# Patient Record
Sex: Female | Born: 1970 | Race: Black or African American | Hispanic: No | Marital: Single | Smoking: Never smoker
Health system: Southern US, Community
[De-identification: ages and names within clinical notes are randomized; demographics above are authoritative.]

## PROBLEM LIST (undated history)

## (undated) DIAGNOSIS — M549 Dorsalgia, unspecified: Secondary | ICD-10-CM

## (undated) DIAGNOSIS — I10 Essential (primary) hypertension: Secondary | ICD-10-CM

## (undated) DIAGNOSIS — F419 Anxiety disorder, unspecified: Secondary | ICD-10-CM

## (undated) DIAGNOSIS — M2550 Pain in unspecified joint: Secondary | ICD-10-CM

## (undated) DIAGNOSIS — E8881 Metabolic syndrome: Secondary | ICD-10-CM

## (undated) DIAGNOSIS — R7303 Prediabetes: Secondary | ICD-10-CM

## (undated) DIAGNOSIS — G4730 Sleep apnea, unspecified: Secondary | ICD-10-CM

## (undated) DIAGNOSIS — E079 Disorder of thyroid, unspecified: Secondary | ICD-10-CM

## (undated) DIAGNOSIS — R600 Localized edema: Secondary | ICD-10-CM

## (undated) DIAGNOSIS — E119 Type 2 diabetes mellitus without complications: Secondary | ICD-10-CM

## (undated) DIAGNOSIS — E0500 Thyrotoxicosis with diffuse goiter without thyrotoxic crisis or storm: Secondary | ICD-10-CM

## (undated) DIAGNOSIS — J45909 Unspecified asthma, uncomplicated: Secondary | ICD-10-CM

## (undated) DIAGNOSIS — R0600 Dyspnea, unspecified: Secondary | ICD-10-CM

## (undated) HISTORY — DX: Anxiety disorder, unspecified: F41.9

## (undated) HISTORY — PX: REDUCTION MAMMAPLASTY: SUR839

## (undated) HISTORY — DX: Dyspnea, unspecified: R06.00

## (undated) HISTORY — PX: DILATION AND CURETTAGE OF UTERUS: SHX78

## (undated) HISTORY — PX: OTHER SURGICAL HISTORY: SHX169

## (undated) HISTORY — PX: INDUCED ABORTION: SHX677

## (undated) HISTORY — DX: Essential (primary) hypertension: I10

## (undated) HISTORY — DX: Morbid (severe) obesity due to excess calories: E66.01

## (undated) HISTORY — DX: Dorsalgia, unspecified: M54.9

## (undated) HISTORY — DX: Localized edema: R60.0

## (undated) HISTORY — DX: Sleep apnea, unspecified: G47.30

## (undated) HISTORY — DX: Prediabetes: R73.03

## (undated) HISTORY — PX: COLONOSCOPY: SHX174

## (undated) HISTORY — DX: Metabolic syndrome: E88.81

## (undated) HISTORY — DX: Pain in unspecified joint: M25.50

## (undated) HISTORY — DX: Metabolic syndrome: E88.810

## (undated) HISTORY — DX: Unspecified asthma, uncomplicated: J45.909

---

## 2006-10-01 HISTORY — PX: BREAST REDUCTION SURGERY: SHX8

## 2016-12-20 ENCOUNTER — Encounter: Payer: Self-pay | Admitting: Family Medicine

## 2016-12-26 ENCOUNTER — Encounter: Payer: Self-pay | Admitting: Family Medicine

## 2016-12-26 ENCOUNTER — Ambulatory Visit (INDEPENDENT_AMBULATORY_CARE_PROVIDER_SITE_OTHER): Payer: PRIVATE HEALTH INSURANCE | Admitting: Family Medicine

## 2016-12-26 ENCOUNTER — Other Ambulatory Visit: Payer: Self-pay | Admitting: Family Medicine

## 2016-12-26 VITALS — BP 150/90 | HR 70 | Ht 61.0 in | Wt 249.0 lb

## 2016-12-26 DIAGNOSIS — Z1231 Encounter for screening mammogram for malignant neoplasm of breast: Secondary | ICD-10-CM | POA: Diagnosis not present

## 2016-12-26 DIAGNOSIS — I10 Essential (primary) hypertension: Secondary | ICD-10-CM | POA: Diagnosis not present

## 2016-12-26 DIAGNOSIS — Z0000 Encounter for general adult medical examination without abnormal findings: Secondary | ICD-10-CM | POA: Diagnosis not present

## 2016-12-26 DIAGNOSIS — J45909 Unspecified asthma, uncomplicated: Secondary | ICD-10-CM | POA: Insufficient documentation

## 2016-12-26 DIAGNOSIS — Z1239 Encounter for other screening for malignant neoplasm of breast: Secondary | ICD-10-CM

## 2016-12-26 DIAGNOSIS — J4520 Mild intermittent asthma, uncomplicated: Secondary | ICD-10-CM

## 2016-12-26 LAB — CBC WITH DIFFERENTIAL/PLATELET
Basophils Absolute: 0 cells/uL (ref 0–200)
Basophils Relative: 0 %
Eosinophils Absolute: 336 cells/uL (ref 15–500)
Eosinophils Relative: 4 %
HCT: 34.9 % — ABNORMAL LOW (ref 35.0–45.0)
Hemoglobin: 11.5 g/dL — ABNORMAL LOW (ref 11.7–15.5)
Lymphocytes Relative: 29 %
Lymphs Abs: 2436 cells/uL (ref 850–3900)
MCH: 27.8 pg (ref 27.0–33.0)
MCHC: 33 g/dL (ref 32.0–36.0)
MCV: 84.3 fL (ref 80.0–100.0)
MPV: 10.2 fL (ref 7.5–12.5)
Monocytes Absolute: 588 cells/uL (ref 200–950)
Monocytes Relative: 7 %
Neutro Abs: 5040 cells/uL (ref 1500–7800)
Neutrophils Relative %: 60 %
Platelets: 282 10*3/uL (ref 140–400)
RBC: 4.14 MIL/uL (ref 3.80–5.10)
RDW: 14.9 % (ref 11.0–15.0)
WBC: 8.4 10*3/uL (ref 4.0–10.5)

## 2016-12-26 LAB — POCT URINALYSIS DIPSTICK
Bilirubin, UA: NEGATIVE
Blood, UA: NEGATIVE
Glucose, UA: NEGATIVE
Ketones, UA: NEGATIVE
Leukocytes, UA: NEGATIVE
Nitrite, UA: NEGATIVE
Protein, UA: NEGATIVE
Spec Grav, UA: 1.03 (ref 1.030–1.035)
Urobilinogen, UA: NEGATIVE (ref ?–2.0)
pH, UA: 5.5 (ref 5.0–8.0)

## 2016-12-26 LAB — COMPREHENSIVE METABOLIC PANEL
ALT: 15 U/L (ref 6–29)
AST: 17 U/L (ref 10–30)
Albumin: 3.7 g/dL (ref 3.6–5.1)
Alkaline Phosphatase: 89 U/L (ref 33–115)
BUN: 10 mg/dL (ref 7–25)
CO2: 29 mmol/L (ref 20–31)
Calcium: 9.3 mg/dL (ref 8.6–10.2)
Chloride: 105 mmol/L (ref 98–110)
Creat: 0.86 mg/dL (ref 0.50–1.10)
Glucose, Bld: 96 mg/dL (ref 65–99)
Potassium: 3.9 mmol/L (ref 3.5–5.3)
Sodium: 139 mmol/L (ref 135–146)
Total Bilirubin: 0.3 mg/dL (ref 0.2–1.2)
Total Protein: 6.5 g/dL (ref 6.1–8.1)

## 2016-12-26 MED ORDER — FLUTICASONE-SALMETEROL 250-50 MCG/DOSE IN AEPB: 1.0000 | INHALATION_SPRAY | Freq: Two times a day (BID) | RESPIRATORY_TRACT | 3 refills | Status: DC

## 2016-12-26 MED ORDER — ALBUTEROL SULFATE HFA 108 (90 BASE) MCG/ACT IN AERS: 2.0000 | INHALATION_SPRAY | Freq: Four times a day (QID) | RESPIRATORY_TRACT | 0 refills | Status: DC | PRN

## 2016-12-26 MED ORDER — FLUTICASONE-SALMETEROL 100-50 MCG/DOSE IN AEPB: 1.0000 | INHALATION_SPRAY | Freq: Two times a day (BID) | RESPIRATORY_TRACT | 2 refills | Status: DC

## 2016-12-26 NOTE — Progress Notes (Signed)
Subjective:    Patient ID: Kim Tanner, female    DOB: 08-16-1971, 46 y.o.   MRN: [id number]  HPI Chief Complaint  Patient presents with  . new pt    new pt fasting cpe, history of asthma   She is new to the practice and here for a complete physical exam. Previous medical care: in [place] last August.   Other providers: none here. PCP and OB/GYN in [place]. Would like a referral to OB/GYN here.  Reports history of Metabolic syndrome?  History of asthma since childhood and triggers are when she is "upset". She states she used to take advair but stopped 1 year ago because she did not have insurance.states her asthma was well controlled on Advair.  She does not have an inhaler.  States she had a flare 2 weeks ago and used her son's nebulizer. No recent ED visits. No hospitalizations since childhood for asthma. States she is not having any symptoms today.   Denies history of HTN. Has had some elevated readings in the past.   Social history: Lives with 2 children, works as a Human resources officer Diet: nothing  Excerise: nothing   Immunizations: Tdap - 18 years ago. Refuses today.   Health maintenance:  Mammogram: 6 years ago Colonoscopy: normal colonoscopy 2 years ago. EGD normal also. No records of this. Last Pap smear: 2016- normal per patient.  Last Menstrual cycle: June 2017 Pregnancies: 3  Last Dental Exam: 2 years ago  Last Eye Exam: years  Wears seatbelt always, smoke detectors in home and functioning, does not text while driving and feels safe in home environment.   Reviewed allergies, medications, past medical, surgical, family, and social history.   Review of Systems Review of Systems Constitutional: -fever, -chills, -sweats, -unexpected weight change,-fatigue ENT: -runny nose, -ear pain, -sore throat Cardiology:  -chest pain, -palpitations, -edema Respiratory: -cough, -shortness of breath, -wheezing Gastroenterology: -abdominal pain, -nausea, -vomiting,  -diarrhea, -constipation  Hematology: -bleeding or bruising problems Musculoskeletal: -arthralgias, -myalgias, -joint swelling, -back pain Ophthalmology: -vision changes Urology: -dysuria, -difficulty urinating, -hematuria, -urinary frequency, -urgency Neurology: -headache, -weakness, -tingling, -numbness       Objective:   Physical Exam BP (!) 150/90   Pulse 70   Ht 5\' 1"  (1.549 m)   Wt 249 lb (112.9 kg)   BMI 47.05 kg/m   General Appearance:    Alert, cooperative, no distress, appears stated age  Head:    Normocephalic, without obvious abnormality, atraumatic  Eyes:    PERRL, conjunctiva/corneas clear, EOM's intact, fundi    benign  Ears:    Normal TM's and external ear canals  Nose:   Nares normal, mucosa normal, no drainage or sinus   tenderness  Throat:   Lips, mucosa, and tongue normal; teeth and gums normal  Neck:   Supple, no lymphadenopathy;  thyroid:  no   enlargement/tenderness/nodules; no carotid   bruit or JVD  Back:    Spine nontender, no curvature, ROM normal, no CVA     tenderness  Lungs:     Clear to auscultation bilaterally without wheezes, rales or     ronchi; respirations unlabored  Chest Wall:    No tenderness or deformity   Heart:    Regular rate and rhythm, S1 and S2 normal, no murmur, rub   or gallop  Breast Exam:    Refused. Mammogram ordered.  Abdomen:     Soft, non-tender, nondistended, normoactive bowel sounds,    no masses, no hepatosplenomegaly  Genitalia:  Refused. Would like OB/GYN referral.   Rectal:    Refused. Recent colonoscopy  Extremities:   No clubbing, cyanosis or edema  Pulses:   2+ and symmetric all extremities  Skin:   Skin color, texture, turgor normal, no rashes or lesions  Lymph nodes:   Cervical, supraclavicular, and axillary nodes normal  Neurologic:   CNII-XII intact, normal strength, sensation and gait; reflexes 2+ and symmetric throughout          Psych:   Normal mood, affect, hygiene and grooming.    Urinalysis  dipstick: negative      Assessment & Plan:  Routine general medical examination at a health care facility - Plan: Urinalysis Dipstick, CBC with Differential/Platelet, Comprehensive metabolic panel, TSH, Lipid panel  Mild intermittent asthma without complication - Plan: Spirometry with graph, Fluticasone-Salmeterol (ADVAIR DISKUS) 100-50 MCG/DOSE AEPB  Elevated blood pressure reading in office with diagnosis of hypertension - Plan: CBC with Differential/Platelet, Comprehensive metabolic panel  Morbid obesity (HCC) - Plan: Lipid panel  Screening for breast cancer - Plan: MM DIGITAL SCREENING BILATERAL  She appears to be doing well. Discussed diet improvements and increasing physical activity. Discussed morbid obesity and risk it poses to her health.  Recommend Tdap but she refuses.  History of asthma and out of medications for maintenance.  Abnormal PFTs. Will start her back on Advair and albuterol. This was what she was on previously.  Discussed that her BP is elevated and I recommend she keep an eye on this and return in 1 month for this and asthma.  She will check with her insurance and schedule with OB/GYN for breast and pelvic exam.  Needs to have her lipids checked next week fasting since she is not fasting today.  Follow up pending labs.

## 2016-12-26 NOTE — Patient Instructions (Addendum)
Call and schedule your mammogram.  Start back on Advair and use albuterol as needed.  Follow up for asthma and blood pressure  in 1 month.   Return next week fasting for your cholesterol check. This will be a lab visit.   Start getting at least 150 minutes of physical activity per week.  Cut back on sugar, carbohydrates and fatty, fried food.  We will call you with lab results.   You can schedule with an OB/GYN for your pelvic, pap smear and breast exam.   Preventative Care for Adults - Female      MAINTAIN REGULAR HEALTH EXAMS:  A routine yearly physical is a good way to check in with your primary care provider about your health and preventive screening. It is also an opportunity to share updates about your health and any concerns you have, and receive a thorough all-over exam.   Most health insurance companies pay for at least some preventative services.  Check with your health plan for specific coverages.  WHAT PREVENTATIVE SERVICES DO WOMEN NEED?  Adult women should have their weight and blood pressure checked regularly.   Women age 60 and older should have their cholesterol levels checked regularly.  Women should be screened for cervical cancer with a Pap smear and pelvic exam beginning at either age 35, or 3 years after they become sexually activity.    Breast cancer screening generally begins at age 31 with a mammogram and breast exam by your primary care provider.    Beginning at age 89 and continuing to age 20, women should be screened for colorectal cancer.  Certain people may need continued testing until age 43.  Updating vaccinations is part of preventative care.  Vaccinations help protect against diseases such as the flu.  Osteoporosis is a disease in which the bones lose minerals and strength as we age. Women ages 55 and over should discuss this with their caregivers, as should women after menopause who have other risk factors.  Lab tests are generally done as part  of preventative care to screen for anemia and blood disorders, to screen for problems with the kidneys and liver, to screen for bladder problems, to check blood sugar, and to check your cholesterol level.  Preventative services generally include counseling about diet, exercise, avoiding tobacco, drugs, excessive alcohol consumption, and sexually transmitted infections.    GENERAL RECOMMENDATIONS FOR GOOD HEALTH:  Healthy diet:  Eat a variety of foods, including fruit, vegetables, animal or vegetable protein, such as meat, fish, chicken, and eggs, or beans, lentils, tofu, and grains, such as rice.  Drink plenty of water daily.  Decrease saturated fat in the diet, avoid lots of red meat, processed foods, sweets, fast foods, and fried foods.  Exercise:  Aerobic exercise helps maintain good heart health. At least 30-40 minutes of moderate-intensity exercise is recommended. For example, a brisk walk that increases your heart rate and breathing. This should be done on most days of the week.   Find a type of exercise or a variety of exercises that you enjoy so that it becomes a part of your daily life.  Examples are running, walking, swimming, water aerobics, and biking.  For motivation and support, explore group exercise such as aerobic class, spin class, Zumba, Yoga,or  martial arts, etc.    Set exercise goals for yourself, such as a certain weight goal, walk or run in a race such as a 5k walk/run.  Speak to your primary care provider about exercise goals.  Disease prevention:  If you smoke or chew tobacco, find out from your caregiver how to quit. It can literally save your life, no matter how long you have been a tobacco user. If you do not use tobacco, never begin.   Maintain a healthy diet and normal weight. Increased weight leads to problems with blood pressure and diabetes.   The Body Mass Index or BMI is a way of measuring how much of your body is fat. Having a BMI above 27 increases  the risk of heart disease, diabetes, hypertension, stroke and other problems related to obesity. Your caregiver can help determine your BMI and based on it develop an exercise and dietary program to help you achieve or maintain this important measurement at a healthful level.  High blood pressure causes heart and blood vessel problems.  Persistent high blood pressure should be treated with medicine if weight loss and exercise do not work.   Fat and cholesterol leaves deposits in your arteries that can block them. This causes heart disease and vessel disease elsewhere in your body.  If your cholesterol is found to be high, or if you have heart disease or certain other medical conditions, then you may need to have your cholesterol monitored frequently and be treated with medication.   Ask if you should have a cardiac stress test if your history suggests this. A stress test is a test done on a treadmill that looks for heart disease. This test can find disease prior to there being a problem.  Menopause can be associated with physical symptoms and risks. Hormone replacement therapy is available to decrease these. You should talk to your caregiver about whether starting or continuing to take hormones is right for you.   Osteoporosis is a disease in which the bones lose minerals and strength as we age. This can result in serious bone fractures. Risk of osteoporosis can be identified using a bone density scan. Women ages 2 and over should discuss this with their caregivers, as should women after menopause who have other risk factors. Ask your caregiver whether you should be taking a calcium supplement and Vitamin D, to reduce the rate of osteoporosis.   Avoid drinking alcohol in excess (more than two drinks per day).  Avoid use of street drugs. Do not share needles with anyone. Ask for professional help if you need assistance or instructions on stopping the use of alcohol, cigarettes, and/or drugs.  Brush  your teeth twice a day with fluoride toothpaste, and floss once a day. Good oral hygiene prevents tooth decay and gum disease. The problems can be painful, unattractive, and can cause other health problems. Visit your dentist for a routine oral and dental check up and preventive care every 6-12 months.   Look at your skin regularly.  Use a mirror to look at your back. Notify your caregivers of changes in moles, especially if there are changes in shapes, colors, a size larger than a pencil eraser, an irregular border, or development of new moles.  Safety:  Use seatbelts 100% of the time, whether driving or as a passenger.  Use safety devices such as hearing protection if you work in environments with loud noise or significant background noise.  Use safety glasses when doing any work that could send debris in to the eyes.  Use a helmet if you ride a bike or motorcycle.  Use appropriate safety gear for contact sports.  Talk to your caregiver about gun safety.  Use sunscreen with a  SPF (or skin protection factor) of 15 or greater.  Lighter skinned people are at a greater risk of skin cancer. Don't forget to also wear sunglasses in order to protect your eyes from too much damaging sunlight. Damaging sunlight can accelerate cataract formation.   Practice safe sex. Use condoms. Condoms are used for birth control and to help reduce the spread of sexually transmitted infections (or STIs).  Some of the STIs are gonorrhea (the clap), chlamydia, syphilis, trichomonas, herpes, HPV (human papilloma virus) and HIV (human immunodeficiency virus) which causes AIDS. The herpes, HIV and HPV are viral illnesses that have no cure. These can result in disability, cancer and death.   Keep carbon monoxide and smoke detectors in your home functioning at all times. Change the batteries every 6 months or use a model that plugs into the wall.   Vaccinations:  Stay up to date with your tetanus shots and other required  immunizations. You should have a booster for tetanus every 10 years. Be sure to get your flu shot every year, since 5%-20% of the [place] population comes down with the flu. The flu vaccine changes each year, so being vaccinated once is not enough. Get your shot in the fall, before the flu season peaks.   Other vaccines to consider:  Human Papilloma Virus or HPV causes cancer of the cervix, and other infections that can be transmitted from person to person. There is a vaccine for HPV, and females should get immunized between the ages of 21 and 80. It requires a series of 3 shots.   Pneumococcal vaccine to protect against certain types of pneumonia.  This is normally recommended for adults age 50 or older.  However, adults younger than 44 years old with certain underlying conditions such as diabetes, heart or lung disease should also receive the vaccine.  Shingles vaccine to protect against Varicella Zoster if you are older than age 103, or younger than 44 years old with certain underlying illness.  Hepatitis A vaccine to protect against a form of infection of the liver by a virus acquired from food.  Hepatitis B vaccine to protect against a form of infection of the liver by a virus acquired from blood or body fluids, particularly if you work in health care.  If you plan to travel internationally, check with your local health department for specific vaccination recommendations.  Cancer Screening:  Breast cancer screening is essential to preventive care for women. All women age 22 and older should perform a breast self-exam every month. At age 79 and older, women should have their caregiver complete a breast exam each year. Women at ages 42 and older should have a mammogram (x-ray film) of the breasts. Your caregiver can discuss how often you need mammograms.    Cervical cancer screening includes taking a Pap smear (sample of cells examined under a microscope) from the cervix (end of the uterus). It  also includes testing for HPV (Human Papilloma Virus, which can cause cervical cancer). Screening and a pelvic exam should begin at age 17, or 3 years after a woman becomes sexually active. Screening should occur every year, with a Pap smear but no HPV testing, up to age 48. After age 33, you should have a Pap smear every 3 years with HPV testing, if no HPV was found previously.   Most routine colon cancer screening begins at the age of 57. On a yearly basis, doctors may provide special easy to use take-home tests to check for hidden  blood in the stool. Sigmoidoscopy or colonoscopy can detect the earliest forms of colon cancer and is life saving. These tests use a small camera at the end of a tube to directly examine the colon. Speak to your caregiver about this at age 100, when routine screening begins (and is repeated every 5 years unless early forms of pre-cancerous polyps or small growths are found).

## 2016-12-27 LAB — T3, FREE: T3, Free: 3.8 pg/mL (ref 2.3–4.2)

## 2016-12-27 LAB — T4, FREE: Free T4: 1.3 ng/dL (ref 0.8–1.8)

## 2016-12-27 LAB — TSH: TSH: 0.01 mIU/L — ABNORMAL LOW

## 2016-12-31 ENCOUNTER — Other Ambulatory Visit: Payer: Self-pay | Admitting: Family Medicine

## 2016-12-31 ENCOUNTER — Encounter: Payer: Self-pay | Admitting: Family Medicine

## 2016-12-31 DIAGNOSIS — E0590 Thyrotoxicosis, unspecified without thyrotoxic crisis or storm: Secondary | ICD-10-CM

## 2016-12-31 DIAGNOSIS — Z1231 Encounter for screening mammogram for malignant neoplasm of breast: Secondary | ICD-10-CM

## 2016-12-31 DIAGNOSIS — E0500 Thyrotoxicosis with diffuse goiter without thyrotoxic crisis or storm: Secondary | ICD-10-CM | POA: Insufficient documentation

## 2016-12-31 DIAGNOSIS — Z8639 Personal history of other endocrine, nutritional and metabolic disease: Secondary | ICD-10-CM | POA: Insufficient documentation

## 2017-01-01 ENCOUNTER — Other Ambulatory Visit: Payer: PRIVATE HEALTH INSURANCE

## 2017-01-01 ENCOUNTER — Other Ambulatory Visit: Payer: Self-pay | Admitting: Family Medicine

## 2017-01-01 DIAGNOSIS — R7989 Other specified abnormal findings of blood chemistry: Secondary | ICD-10-CM

## 2017-01-10 ENCOUNTER — Other Ambulatory Visit: Payer: PRIVATE HEALTH INSURANCE

## 2017-01-10 DIAGNOSIS — Z0000 Encounter for general adult medical examination without abnormal findings: Secondary | ICD-10-CM

## 2017-01-10 DIAGNOSIS — R7989 Other specified abnormal findings of blood chemistry: Secondary | ICD-10-CM

## 2017-01-10 LAB — LIPID PANEL
Cholesterol: 148 mg/dL (ref <200)
HDL: 41 mg/dL — ABNORMAL LOW (ref >50)
LDL Cholesterol: 95 mg/dL (ref <100)
Total CHOL/HDL Ratio: 3.6 Ratio (ref <5.0)
Triglycerides: 62 mg/dL (ref <150)
VLDL: 12 mg/dL (ref <30)

## 2017-01-10 LAB — TSH: TSH: 0.01 mIU/L — ABNORMAL LOW

## 2017-01-14 ENCOUNTER — Other Ambulatory Visit: Payer: Self-pay | Admitting: Family Medicine

## 2017-01-14 DIAGNOSIS — R7989 Other specified abnormal findings of blood chemistry: Secondary | ICD-10-CM

## 2017-01-21 ENCOUNTER — Other Ambulatory Visit: Payer: Self-pay

## 2017-01-21 ENCOUNTER — Ambulatory Visit
Admission: RE | Admit: 2017-01-21 | Discharge: 2017-01-21 | Disposition: A | Discharge status: Home / Self Care | Payer: PRIVATE HEALTH INSURANCE | Source: Ambulatory Visit | Attending: Family Medicine | Admitting: Family Medicine

## 2017-01-21 DIAGNOSIS — R7989 Other specified abnormal findings of blood chemistry: Secondary | ICD-10-CM

## 2017-01-23 ENCOUNTER — Ambulatory Visit (INDEPENDENT_AMBULATORY_CARE_PROVIDER_SITE_OTHER): Payer: PRIVATE HEALTH INSURANCE | Admitting: Family Medicine

## 2017-01-23 ENCOUNTER — Encounter: Payer: Self-pay | Admitting: Family Medicine

## 2017-01-23 VITALS — BP 150/80 | HR 77 | Wt 251.4 lb

## 2017-01-23 DIAGNOSIS — R946 Abnormal results of thyroid function studies: Secondary | ICD-10-CM | POA: Diagnosis not present

## 2017-01-23 DIAGNOSIS — R7989 Other specified abnormal findings of blood chemistry: Secondary | ICD-10-CM

## 2017-01-23 DIAGNOSIS — I10 Essential (primary) hypertension: Secondary | ICD-10-CM | POA: Diagnosis not present

## 2017-01-23 DIAGNOSIS — N912 Amenorrhea, unspecified: Secondary | ICD-10-CM | POA: Insufficient documentation

## 2017-01-23 DIAGNOSIS — E041 Nontoxic single thyroid nodule: Secondary | ICD-10-CM

## 2017-01-23 LAB — POCT URINE PREGNANCY: Preg Test, Ur: NEGATIVE

## 2017-01-23 NOTE — Progress Notes (Signed)
   Subjective:    Patient ID: Kim Tanner, female    DOB: 21-Mar-1971, 46 y.o.   MRN: [id number]  HPI Chief Complaint  Patient presents with  . 1 month follow-up    1 month follow-up   She is here to follow up on abnormal TSH and HTN.   No period since last June. History of uterine ablation for heavy bleeding. She had irregular periods after that procedure but then stopped having cycles last June.         Review of Systems Pertinent positives and negatives in the history of present illness.     Objective:   Physical Exam BP (!) 150/80   Pulse 77   Wt 251 lb 6.4 oz (114 kg)   BMI 47.50 kg/m         Assessment & Plan:  Essential hypertension  Low TSH level - Plan: Prolactin, Ambulatory referral to Endocrinology  Amenorrhea - Plan: POCT urine pregnancy, FSH/LH, Estrogens, Total, Prolactin  Morbid obesity ([place])  Right thyroid nodule - Plan: Ambulatory referral to Endocrinology  She does not want to start on medication for blood pressure and would like to try making lifestyle changes.  Discussed risk factors for heart disease including uncontrolled HTN and morbid obesity. Discussed complications of HTN.  Counseled on using free app My Fitness Pal to track calories and nutrition. Encouraged weight loss. She will also watch her sodium intake.  Recommend getting at least 150 minutes of physical activity.  Will see how she can do with lifestyle changes per patient request. She will check her BP at least twice weekly and call me in 1 month with her readings.  Plan to refer her to endocrinology for further evaluation of persistently low TSH, normal T3 and free T4 and right thyroid nodule.  Discussed that she has not had a menstrual cycles in 10 months and this may be due to menopause. Will check labs and follow up.

## 2017-01-23 NOTE — Patient Instructions (Addendum)
Start checking your blood pressure 2-3 times per week and call me in 1 month. I know you are not in favor of starting on medication so let's see how you do over the next 4 weeks.  Start using the free app My Fitness Pal and start getting at least 150 minutes of physical activity per week.  Do no drink your calories. Cut back on carbohydrates and eat small frequent meals throughout the day. eat healthy snacks.  Let's see how you are doing in 1 month with your lifestyle modifications and blood pressure. Call me.     Hypertension Hypertension, commonly called high blood pressure, is when the force of blood pumping through the arteries is too strong. The arteries are the blood vessels that carry blood from the heart throughout the body. Hypertension forces the heart to work harder to pump blood and may cause arteries to become narrow or stiff. Having untreated or uncontrolled hypertension can cause heart attacks, strokes, kidney disease, and other problems. A blood pressure reading consists of a higher number over a lower number. Ideally, your blood pressure should be below 120/80. The first ("top") number is called the systolic pressure. It is a measure of the pressure in your arteries as your heart beats. The second ("bottom") number is called the diastolic pressure. It is a measure of the pressure in your arteries as the heart relaxes. What are the causes? The cause of this condition is not known. What increases the risk? Some risk factors for high blood pressure are under your control. Others are not. Factors you can change   Smoking.  Having type 2 diabetes mellitus, high cholesterol, or both.  Not getting enough exercise or physical activity.  Being overweight.  Having too much fat, sugar, calories, or salt (sodium) in your diet.  Drinking too much alcohol. Factors that are difficult or impossible to change   Having chronic kidney disease.  Having a family history of high blood  pressure.  Age. Risk increases with age.  Race. You may be at higher risk if you are African-American.  Gender. Men are at higher risk than women before age 91. After age 77, women are at higher risk than men.  Having obstructive sleep apnea.  Stress. What are the signs or symptoms? Extremely high blood pressure (hypertensive crisis) may cause:  Headache.  Anxiety.  Shortness of breath.  Nosebleed.  Nausea and vomiting.  Severe chest pain.  Jerky movements you cannot control (seizures). How is this diagnosed? This condition is diagnosed by measuring your blood pressure while you are seated, with your arm resting on a surface. The cuff of the blood pressure monitor will be placed directly against the skin of your upper arm at the level of your heart. It should be measured at least twice using the same arm. Certain conditions can cause a difference in blood pressure between your right and left arms. Certain factors can cause blood pressure readings to be lower or higher than normal (elevated) for a short period of time:  When your blood pressure is higher when you are in a health care provider's office than when you are at home, this is called white coat hypertension. Most people with this condition do not need medicines.  When your blood pressure is higher at home than when you are in a health care provider's office, this is called masked hypertension. Most people with this condition may need medicines to control blood pressure. If you have a high blood pressure reading  during one visit or you have normal blood pressure with other risk factors:  You may be asked to return on a different day to have your blood pressure checked again.  You may be asked to monitor your blood pressure at home for 1 week or longer. If you are diagnosed with hypertension, you may have other blood or imaging tests to help your health care provider understand your overall risk for other conditions. How  is this treated? This condition is treated by making healthy lifestyle changes, such as eating healthy foods, exercising more, and reducing your alcohol intake. Your health care provider may prescribe medicine if lifestyle changes are not enough to get your blood pressure under control, and if:  Your systolic blood pressure is above 130.  Your diastolic blood pressure is above 80. Your personal target blood pressure may vary depending on your medical conditions, your age, and other factors. Follow these instructions at home: Eating and drinking   Eat a diet that is high in fiber and potassium, and low in sodium, added sugar, and fat. An example eating plan is called the DASH (Dietary Approaches to Stop Hypertension) diet. To eat this way:  Eat plenty of fresh fruits and vegetables. Try to fill half of your plate at each meal with fruits and vegetables.  Eat whole grains, such as whole wheat pasta, brown rice, or whole grain bread. Fill about one quarter of your plate with whole grains.  Eat or drink low-fat dairy products, such as skim milk or low-fat yogurt.  Avoid fatty cuts of meat, processed or cured meats, and poultry with skin. Fill about one quarter of your plate with lean proteins, such as fish, chicken without skin, beans, eggs, and tofu.  Avoid premade and processed foods. These tend to be higher in sodium, added sugar, and fat.  Reduce your daily sodium intake. Most people with hypertension should eat less than 1,500 mg of sodium a day.  Limit alcohol intake to no more than 1 drink a day for nonpregnant women and 2 drinks a day for men. One drink equals 12 oz of beer, 5 oz of wine, or 1 oz of hard liquor. Lifestyle   Work with your health care provider to maintain a healthy body weight or to lose weight. Ask what an ideal weight is for you.  Get at least 30 minutes of exercise that causes your heart to beat faster (aerobic exercise) most days of the week. Activities may  include walking, swimming, or biking.  Include exercise to strengthen your muscles (resistance exercise), such as pilates or lifting weights, as part of your weekly exercise routine. Try to do these types of exercises for 30 minutes at least 3 days a week.  Do not use any products that contain nicotine or tobacco, such as cigarettes and e-cigarettes. If you need help quitting, ask your health care provider.  Monitor your blood pressure at home as told by your health care provider.  Keep all follow-up visits as told by your health care provider. This is important. Medicines   Take over-the-counter and prescription medicines only as told by your health care provider. Follow directions carefully. Blood pressure medicines must be taken as prescribed.  Do not skip doses of blood pressure medicine. Doing this puts you at risk for problems and can make the medicine less effective.  Ask your health care provider about side effects or reactions to medicines that you should watch for. Contact a health care provider if:  You  think you are having a reaction to a medicine you are taking.  You have headaches that keep coming back (recurring).  You feel dizzy.  You have swelling in your ankles.  You have trouble with your vision. Get help right away if:  You develop a severe headache or confusion.  You have unusual weakness or numbness.  You feel faint.  You have severe pain in your chest or abdomen.  You vomit repeatedly.  You have trouble breathing. Summary  Hypertension is when the force of blood pumping through your arteries is too strong. If this condition is not controlled, it may put you at risk for serious complications.  Your personal target blood pressure may vary depending on your medical conditions, your age, and other factors. For most people, a normal blood pressure is less than 120/80.  Hypertension is treated with lifestyle changes, medicines, or a combination of both.  Lifestyle changes include weight loss, eating a healthy, low-sodium diet, exercising more, and limiting alcohol. This information is not intended to replace advice given to you by your health care provider. Make sure you discuss any questions you have with your health care provider. Document Released: 09/17/2005 Document Revised: 08/15/2016 Document Reviewed: 08/15/2016 Elsevier Interactive Patient Education  2017 [employer].

## 2017-01-24 LAB — PROLACTIN: Prolactin: 7.3 ng/mL

## 2017-01-24 LAB — FSH/LH
FSH: 6.1 m[IU]/mL
LH: 1.4 m[IU]/mL

## 2017-01-26 LAB — ESTROGENS, TOTAL: Estrogen: 96.1 pg/mL

## 2017-02-15 ENCOUNTER — Encounter: Payer: Self-pay | Admitting: Endocrinology

## 2017-06-27 ENCOUNTER — Ambulatory Visit (INDEPENDENT_AMBULATORY_CARE_PROVIDER_SITE_OTHER): Payer: PRIVATE HEALTH INSURANCE | Admitting: Family Medicine

## 2017-06-27 ENCOUNTER — Encounter: Payer: Self-pay | Admitting: Family Medicine

## 2017-06-27 VITALS — BP 172/98 | HR 73 | Ht 61.0 in | Wt 250.4 lb

## 2017-06-27 DIAGNOSIS — R946 Abnormal results of thyroid function studies: Secondary | ICD-10-CM | POA: Diagnosis not present

## 2017-06-27 DIAGNOSIS — I10 Essential (primary) hypertension: Secondary | ICD-10-CM | POA: Diagnosis not present

## 2017-06-27 DIAGNOSIS — R7989 Other specified abnormal findings of blood chemistry: Secondary | ICD-10-CM

## 2017-06-27 DIAGNOSIS — H6123 Impacted cerumen, bilateral: Secondary | ICD-10-CM | POA: Diagnosis not present

## 2017-06-27 MED ORDER — AMLODIPINE BESYLATE 5 MG PO TABS: 5.0000 mg | ORAL_TABLET | Freq: Every day | ORAL | 0 refills | Status: DC

## 2017-06-27 NOTE — Patient Instructions (Addendum)
Start the amlodipine. Try to spot check your blood pressure at a pharmacy or somewhere. You can purchase a cuff to use at home but make sure you get the correct size cuff.   Cut back on salt. Increase exercise.   Follow up with me in 2 weeks.    Hypertension Hypertension, commonly called high blood pressure, is when the force of blood pumping through the arteries is too strong. The arteries are the blood vessels that carry blood from the heart throughout the body. Hypertension forces the heart to work harder to pump blood and may cause arteries to become narrow or stiff. Having untreated or uncontrolled hypertension can cause heart attacks, strokes, kidney disease, and other problems. A blood pressure reading consists of a higher number over a lower number. Ideally, your blood pressure should be below 120/80. The first ("top") number is called the systolic pressure. It is a measure of the pressure in your arteries as your heart beats. The second ("bottom") number is called the diastolic pressure. It is a measure of the pressure in your arteries as the heart relaxes. What are the causes? The cause of this condition is not known. What increases the risk? Some risk factors for high blood pressure are under your control. Others are not. Factors you can change  Smoking.  Having type 2 diabetes mellitus, high cholesterol, or both.  Not getting enough exercise or physical activity.  Being overweight.  Having too much fat, sugar, calories, or salt (sodium) in your diet.  Drinking too much alcohol. Factors that are difficult or impossible to change  Having chronic kidney disease.  Having a family history of high blood pressure.  Age. Risk increases with age.  Race. You may be at higher risk if you are African-American.  Gender. Men are at higher risk than women before age 20. After age 51, women are at higher risk than men.  Having obstructive sleep apnea.  Stress. What are the signs  or symptoms? Extremely high blood pressure (hypertensive crisis) may cause:  Headache.  Anxiety.  Shortness of breath.  Nosebleed.  Nausea and vomiting.  Severe chest pain.  Jerky movements you cannot control (seizures).  How is this diagnosed? This condition is diagnosed by measuring your blood pressure while you are seated, with your arm resting on a surface. The cuff of the blood pressure monitor will be placed directly against the skin of your upper arm at the level of your heart. It should be measured at least twice using the same arm. Certain conditions can cause a difference in blood pressure between your right and left arms. Certain factors can cause blood pressure readings to be lower or higher than normal (elevated) for a short period of time:  When your blood pressure is higher when you are in a health care provider's office than when you are at home, this is called white coat hypertension. Most people with this condition do not need medicines.  When your blood pressure is higher at home than when you are in a health care provider's office, this is called masked hypertension. Most people with this condition may need medicines to control blood pressure.  If you have a high blood pressure reading during one visit or you have normal blood pressure with other risk factors:  You may be asked to return on a different day to have your blood pressure checked again.  You may be asked to monitor your blood pressure at home for 1 week or longer.  If you are diagnosed with hypertension, you may have other blood or imaging tests to help your health care provider understand your overall risk for other conditions. How is this treated? This condition is treated by making healthy lifestyle changes, such as eating healthy foods, exercising more, and reducing your alcohol intake. Your health care provider may prescribe medicine if lifestyle changes are not enough to get your blood pressure  under control, and if:  Your systolic blood pressure is above 130.  Your diastolic blood pressure is above 80.  Your personal target blood pressure may vary depending on your medical conditions, your age, and other factors. Follow these instructions at home: Eating and drinking  Eat a diet that is high in fiber and potassium, and low in sodium, added sugar, and fat. An example eating plan is called the DASH (Dietary Approaches to Stop Hypertension) diet. To eat this way: ? Eat plenty of fresh fruits and vegetables. Try to fill half of your plate at each meal with fruits and vegetables. ? Eat whole grains, such as whole wheat pasta, brown rice, or whole grain bread. Fill about one quarter of your plate with whole grains. ? Eat or drink low-fat dairy products, such as skim milk or low-fat yogurt. ? Avoid fatty cuts of meat, processed or cured meats, and poultry with skin. Fill about one quarter of your plate with lean proteins, such as fish, chicken without skin, beans, eggs, and tofu. ? Avoid premade and processed foods. These tend to be higher in sodium, added sugar, and fat.  Reduce your daily sodium intake. Most people with hypertension should eat less than 1,500 mg of sodium a day.  Limit alcohol intake to no more than 1 drink a day for nonpregnant women and 2 drinks a day for men. One drink equals 12 oz of beer, 5 oz of wine, or 1 oz of hard liquor. Lifestyle  Work with your health care provider to maintain a healthy body weight or to lose weight. Ask what an ideal weight is for you.  Get at least 30 minutes of exercise that causes your heart to beat faster (aerobic exercise) most days of the week. Activities may include walking, swimming, or biking.  Include exercise to strengthen your muscles (resistance exercise), such as pilates or lifting weights, as part of your weekly exercise routine. Try to do these types of exercises for 30 minutes at least 3 days a week.  Do not use any  products that contain nicotine or tobacco, such as cigarettes and e-cigarettes. If you need help quitting, ask your health care provider.  Monitor your blood pressure at home as told by your health care provider.  Keep all follow-up visits as told by your health care provider. This is important. Medicines  Take over-the-counter and prescription medicines only as told by your health care provider. Follow directions carefully. Blood pressure medicines must be taken as prescribed.  Do not skip doses of blood pressure medicine. Doing this puts you at risk for problems and can make the medicine less effective.  Ask your health care provider about side effects or reactions to medicines that you should watch for. Contact a health care provider if:  You think you are having a reaction to a medicine you are taking.  You have headaches that keep coming back (recurring).  You feel dizzy.  You have swelling in your ankles.  You have trouble with your vision. Get help right away if:  You develop a severe headache  or confusion.  You have unusual weakness or numbness.  You feel faint.  You have severe pain in your chest or abdomen.  You vomit repeatedly.  You have trouble breathing. Summary  Hypertension is when the force of blood pumping through your arteries is too strong. If this condition is not controlled, it may put you at risk for serious complications.  Your personal target blood pressure may vary depending on your medical conditions, your age, and other factors. For most people, a normal blood pressure is less than 120/80.  Hypertension is treated with lifestyle changes, medicines, or a combination of both. Lifestyle changes include weight loss, eating a healthy, low-sodium diet, exercising more, and limiting alcohol. This information is not intended to replace advice given to you by your health care provider. Make sure you discuss any questions you have with your health care  provider. Document Released: 09/17/2005 Document Revised: 08/15/2016 Document Reviewed: 08/15/2016 Elsevier Interactive Patient Education  2018 [place] Eating Plan DASH stands for "Dietary Approaches to Stop Hypertension." The DASH eating plan is a healthy eating plan that has been shown to reduce high blood pressure (hypertension). It may also reduce your risk for type 2 diabetes, heart disease, and stroke. The DASH eating plan may also help with weight loss. What are tips for following this plan? General guidelines  Avoid eating more than 2,300 mg (milligrams) of salt (sodium) a day. If you have hypertension, you may need to reduce your sodium intake to 1,500 mg a day.  Limit alcohol intake to no more than 1 drink a day for nonpregnant women and 2 drinks a day for men. One drink equals 12 oz of beer, 5 oz of wine, or 1 oz of hard liquor.  Work with your health care provider to maintain a healthy body weight or to lose weight. Ask what an ideal weight is for you.  Get at least 30 minutes of exercise that causes your heart to beat faster (aerobic exercise) most days of the week. Activities may include walking, swimming, or biking.  Work with your health care provider or diet and nutrition specialist (dietitian) to adjust your eating plan to your individual calorie needs. Reading food labels  Check food labels for the amount of sodium per serving. Choose foods with less than 5 percent of the Daily Value of sodium. Generally, foods with less than 300 mg of sodium per serving fit into this eating plan.  To find whole grains, look for the word "whole" as the first word in the ingredient list. Shopping  Buy products labeled as "low-sodium" or "no salt added."  Buy fresh foods. Avoid canned foods and premade or frozen meals. Cooking  Avoid adding salt when cooking. Use salt-free seasonings or herbs instead of table salt or sea salt. Check with your health care provider or  pharmacist before using salt substitutes.  Do not fry foods. Cook foods using healthy methods such as baking, boiling, grilling, and broiling instead.  Cook with heart-healthy oils, such as olive, canola, soybean, or sunflower oil. Meal planning   Eat a balanced diet that includes: ? 5 or more servings of fruits and vegetables each day. At each meal, try to fill half of your plate with fruits and vegetables. ? Up to 6-8 servings of whole grains each day. ? Less than 6 oz of lean meat, poultry, or fish each day. A 3-oz serving of meat is about the same size as a deck of cards. One  egg equals 1 oz. ? 2 servings of low-fat dairy each day. ? A serving of nuts, seeds, or beans 5 times each week. ? Heart-healthy fats. Healthy fats called Omega-3 fatty acids are found in foods such as flaxseeds and coldwater fish, like sardines, salmon, and mackerel.  Limit how much you eat of the following: ? Canned or prepackaged foods. ? Food that is high in trans fat, such as fried foods. ? Food that is high in saturated fat, such as fatty meat. ? Sweets, desserts, sugary drinks, and other foods with added sugar. ? Full-fat dairy products.  Do not salt foods before eating.  Try to eat at least 2 vegetarian meals each week.  Eat more home-cooked food and less restaurant, buffet, and fast food.  When eating at a restaurant, ask that your food be prepared with less salt or no salt, if possible. What foods are recommended? The items listed may not be a complete list. Talk with your dietitian about what dietary choices are best for you. Grains Whole-grain or whole-wheat bread. Whole-grain or whole-wheat pasta. Brown rice. [place]. Bulgur. Whole-grain and low-sodium cereals. Pita bread. Low-fat, low-sodium crackers. Whole-wheat flour tortillas. Vegetables Fresh or frozen vegetables (raw, steamed, roasted, or grilled). Low-sodium or reduced-sodium tomato and vegetable juice. Low-sodium or  reduced-sodium tomato sauce and tomato paste. Low-sodium or reduced-sodium canned vegetables. Fruits All fresh, dried, or frozen fruit. Canned fruit in natural juice (without added sugar). Meat and other protein foods Skinless chicken or [place]. Ground chicken or [place]. Pork with fat trimmed off. Fish and seafood. Egg whites. Dried beans, peas, or lentils. Unsalted nuts, nut butters, and seeds. Unsalted canned beans. Lean cuts of beef with fat trimmed off. Low-sodium, lean deli meat. Dairy Low-fat (1%) or fat-free (skim) milk. Fat-free, low-fat, or reduced-fat cheeses. Nonfat, low-sodium ricotta or cottage cheese. Low-fat or nonfat yogurt. Low-fat, low-sodium cheese. Fats and oils Soft margarine without trans fats. Vegetable oil. Low-fat, reduced-fat, or light mayonnaise and salad dressings (reduced-sodium). Canola, safflower, olive, soybean, and sunflower oils. Avocado. Seasoning and other foods Herbs. Spices. Seasoning mixes without salt. Unsalted popcorn and pretzels. Fat-free sweets. What foods are not recommended? The items listed may not be a complete list. Talk with your dietitian about what dietary choices are best for you. Grains Baked goods made with fat, such as croissants, muffins, or some breads. Dry pasta or rice meal packs. Vegetables Creamed or fried vegetables. Vegetables in a cheese sauce. Regular canned vegetables (not low-sodium or reduced-sodium). Regular canned tomato sauce and paste (not low-sodium or reduced-sodium). Regular tomato and vegetable juice (not low-sodium or reduced-sodium). Angie Fava. Olives. Fruits Canned fruit in a light or heavy syrup. Fried fruit. Fruit in cream or butter sauce. Meat and other protein foods Fatty cuts of meat. Ribs. Fried meat. Berniece Salines. Sausage. Bologna and other processed lunch meats. Salami. Fatback. Hotdogs. Bratwurst. Salted nuts and seeds. Canned beans with added salt. Canned or smoked fish. Whole eggs or egg yolks. Chicken or [place]  with skin. Dairy Whole or 2% milk, cream, and half-and-half. Whole or full-fat cream cheese. Whole-fat or sweetened yogurt. Full-fat cheese. Nondairy creamers. Whipped toppings. Processed cheese and cheese spreads. Fats and oils Butter. Stick margarine. Lard. Shortening. Ghee. Bacon fat. Tropical oils, such as coconut, palm kernel, or palm oil. Seasoning and other foods Salted popcorn and pretzels. Onion salt, garlic salt, seasoned salt, table salt, and sea salt. Worcestershire sauce. Tartar sauce. Barbecue sauce. Teriyaki sauce. Soy sauce, including reduced-sodium. Steak sauce. Canned and packaged gravies. Fish  sauce. Oyster sauce. Cocktail sauce. Horseradish that you find on the shelf. Ketchup. Mustard. Meat flavorings and tenderizers. Bouillon cubes. Hot sauce and Tabasco sauce. Premade or packaged marinades. Premade or packaged taco seasonings. Relishes. Regular salad dressings. Where to find more information:  National Heart, Lung, and [place]: https://wilson-eaton.com/  American Heart Association: www.heart.org Summary  The DASH eating plan is a healthy eating plan that has been shown to reduce high blood pressure (hypertension). It may also reduce your risk for type 2 diabetes, heart disease, and stroke.  With the DASH eating plan, you should limit salt (sodium) intake to 2,300 mg a day. If you have hypertension, you may need to reduce your sodium intake to 1,500 mg a day.  When on the DASH eating plan, aim to eat more fresh fruits and vegetables, whole grains, lean proteins, low-fat dairy, and heart-healthy fats.  Work with your health care provider or diet and nutrition specialist (dietitian) to adjust your eating plan to your individual calorie needs. This information is not intended to replace advice given to you by your health care provider. Make sure you discuss any questions you have with your health care provider. Document Released: 09/06/2011 Document Revised: 09/10/2016  Document Reviewed: 09/10/2016 Elsevier Interactive Patient Education  2017 [employer].

## 2017-06-27 NOTE — Progress Notes (Signed)
   Subjective:    Patient ID: Kim Tanner, female    DOB: [birth year], 46 y.o.   MRN: [id number]  HPI Chief Complaint  Patient presents with  . blood pressure running high    blood pressure running high. went to urgent care to get CPE and TB for work and was told her bp was high, hasn't checked it since that visit. declines flu shot   She is here to follow up on elevated BP from her last visit in April 2018. She did not return as recommended.  Reports history of HTN. States she has never taken medication for this. States she does not want to have HTN and is upset about this.   Her TSH was low with a normal free T4 and T3. US showed a benign nodule that did not require follow up. I referred her to endocrinology but states she did not schedule an appointment to see them.   Complains of left ear feeling clogged.   Denies fever, chills, dizziness, rhinorrhea, nasal congestion, sore throat, chest pain, palpitations, shortness of breath, abdominal pain, N/V/D, urinary symptoms, LE edema.   Reviewed allergies, medications, past medical, surgical, family, and social history.   Review of Systems Pertinent positives and negatives in the history of present illness.     Objective:   Physical Exam BP (!) 172/98   Pulse 73   Ht '5\' 1"'$  (1.549 m)   Wt 250 lb 6.4 oz (113.6 kg)   BMI 47.31 kg/m   Alert and in no distress. Tympanic membranes cannot be visualized due to cerumen impacted canals bilaterally. Pharyngeal area is normal. Neck is supple without adenopathy or thyromegaly. Cardiac exam shows a regular sinus rhythm without murmurs or gallops. Lungs are clear to auscultation.     Assessment & Plan:  Essential hypertension - Plan: amLODipine (NORVASC) 5 MG tablet  Low TSH level  Bilateral impacted cerumen  Recommend doing labs and ECG. She would like to return in October when her new insurance is effective. Declines any tests today.  Will start her on amlodipine and have her start  checking her BP at home.  Counseled on healthy diet and exercise.  She will follow up in 2 weeks for further evaluation of HTN.  She may try OTC cerumen removal kit or let us know if she would like to return for a disimpaction. She did not have time today.

## 2017-07-11 ENCOUNTER — Ambulatory Visit: Payer: PRIVATE HEALTH INSURANCE | Admitting: Family Medicine

## 2017-08-21 ENCOUNTER — Encounter: Payer: Self-pay | Admitting: Family Medicine

## 2017-08-21 ENCOUNTER — Ambulatory Visit: Payer: BC Managed Care – PPO | Admitting: Family Medicine

## 2017-08-21 VITALS — BP 144/88 | HR 80 | Wt 251.0 lb

## 2017-08-21 DIAGNOSIS — I10 Essential (primary) hypertension: Secondary | ICD-10-CM

## 2017-08-21 DIAGNOSIS — R7989 Other specified abnormal findings of blood chemistry: Secondary | ICD-10-CM

## 2017-08-21 DIAGNOSIS — H6122 Impacted cerumen, left ear: Secondary | ICD-10-CM

## 2017-08-21 DIAGNOSIS — M25561 Pain in right knee: Secondary | ICD-10-CM | POA: Diagnosis not present

## 2017-08-21 LAB — COMPLETE METABOLIC PANEL WITH GFR
AG Ratio: 1.4 (calc) (ref 1.0–2.5)
ALT: 14 U/L (ref 6–29)
AST: 14 U/L (ref 10–30)
Albumin: 3.6 g/dL (ref 3.6–5.1)
Alkaline phosphatase (APISO): 92 U/L (ref 33–115)
BUN: 8 mg/dL (ref 7–25)
CO2: 28 mmol/L (ref 20–32)
Calcium: 9 mg/dL (ref 8.6–10.2)
Chloride: 105 mmol/L (ref 98–110)
Creat: 0.77 mg/dL (ref 0.50–1.10)
GFR, Est African American: 109 mL/min/{1.73_m2} (ref >=60)
GFR, Est Non African American: 94 mL/min/{1.73_m2} (ref >=60)
Globulin: 2.6 g/dL (calc) (ref 1.9–3.7)
Glucose, Bld: 114 mg/dL — ABNORMAL HIGH (ref 65–99)
Potassium: 3.9 mmol/L (ref 3.5–5.3)
Sodium: 140 mmol/L (ref 135–146)
Total Bilirubin: 0.3 mg/dL (ref 0.2–1.2)
Total Protein: 6.2 g/dL (ref 6.1–8.1)

## 2017-08-21 LAB — T4, FREE: Free T4: 1.4 ng/dL (ref 0.8–1.8)

## 2017-08-21 LAB — CBC WITH DIFFERENTIAL/PLATELET
Basophils Absolute: 32 cells/uL (ref 0–200)
Basophils Relative: 0.5 %
Eosinophils Absolute: 128 cells/uL (ref 15–500)
Eosinophils Relative: 2 %
HCT: 34.8 % — ABNORMAL LOW (ref 35.0–45.0)
Hemoglobin: 11.8 g/dL (ref 11.7–15.5)
Lymphs Abs: 1856 cells/uL (ref 850–3900)
MCH: 27.9 pg (ref 27.0–33.0)
MCHC: 33.9 g/dL (ref 32.0–36.0)
MCV: 82.3 fL (ref 80.0–100.0)
MPV: 11.3 fL (ref 7.5–12.5)
Monocytes Relative: 5.9 %
Neutro Abs: 4006 cells/uL (ref 1500–7800)
Neutrophils Relative %: 62.6 %
Platelets: 251 10*3/uL (ref 140–400)
RBC: 4.23 10*6/uL (ref 3.80–5.10)
RDW: 14.4 % (ref 11.0–15.0)
Total Lymphocyte: 29 %
WBC mixed population: 378 cells/uL (ref 200–950)
WBC: 6.4 10*3/uL (ref 3.8–10.8)

## 2017-08-21 LAB — T3: T3, Total: 156 ng/dL (ref 76–181)

## 2017-08-21 LAB — TSH: TSH: 0.01 mIU/L — ABNORMAL LOW

## 2017-08-21 MED ORDER — AMLODIPINE BESYLATE 5 MG PO TABS: 5.0000 mg | ORAL_TABLET | Freq: Every day | ORAL | 2 refills | Status: DC

## 2017-08-21 NOTE — Progress Notes (Signed)
   Subjective:    Patient ID: Kim Tanner, female    DOB: 12-Oct-1970, 46 y.o.   MRN: [id number]  HPI Chief Complaint  Patient presents with  . Follow-up    follow up form htn, ear feeling clogged    She is here to follow up on HTN. She has multiple complaints. States she ran out of her medication 2 weeks ago and did not let us know.   States she made some healthy diet changes and has cut back on soda and pork.  States she has been walking more.  She has not been checking her BP at home.   States her periods are regular now.   Complains of acute right knee pain without injury but she does have a history of right knee pain in the past and has been evaluated for this in [place]. States she has an XR 1-2 years ago. Pain is medial and lateral.  Denies locking, popping or giving away.   Complains of left ear feeling clogged and decreased hearing. Requests ear lavage.   Denies fever, chills, dizziness, chest pain, palpitations, shortness of breath, abdominal pain, N/V/D, urinary symptoms, LE edema.   Reviewed allergies, medications, past medical, surgical, and social history.   Review of Systems Pertinent positives and negatives in the history of present illness.     Objective:   Physical Exam BP (!) 144/88   Pulse 80   Wt 251 lb (113.9 kg)   SpO2 98%   BMI 47.43 kg/m  Alert and in no distress. Left TM with cerumen impaction prior to ear lavage, normal TM post lavage, Right TM normal and canals are normal. Pharyngeal area is normal. Neck is supple without adenopathy or thyromegaly. Cardiac exam shows a regular sinus rhythm without murmurs or gallops. Lungs are clear to auscultation. Right knee without erythema, effusion, medial and lateral joint line tenderness, no laxity, negative McMurrays. RLE is neurovascularly intact. Normal gait.       Assessment & Plan:  Essential hypertension - Plan: amLODipine (NORVASC) 5 MG tablet, CBC with Differential/Platelet, COMPLETE METABOLIC  PANEL WITH GFR, CANCELED: POCT Urinalysis DIP (Proadvantage Device)  Acute pain of right knee  Morbid obesity ([place]) - Plan: TSH  Low TSH level - Plan: TSH, T4, Free, T3  Impacted cerumen of left ear  HTN- BP is not in goal range and she has not been taking medication. Will refill medication and strongly encouraged good compliance. She is aware of potential consequences of uncontrolled HTN long term.  She is making healthy diet and lifestyle changes. Discussed weight loss will help knee pain.  Discussed conservative treatment for right knee pain including Aleve, ice if needed.  We will attempt to get records including right knee XR results from [place] 1-2 years ago.  She did not follow up with endocrinology for low TSH, normal T3, free T4. We will recheck labs.  Ear lavage done on left ear due to cerumen impaction and she tolerated this well. Normal TM and hearing improved post lavage.  Call me in 2 weeks with BP readings.

## 2017-08-21 NOTE — Patient Instructions (Signed)
Start back on amlodipine.  Continue checking blood pressures daily and keep a record of these.  Call me in 2 weeks and let me know what yourreadings are.  Cut back on carbohydrates and increase your physical activity.  Return in 3 months or sooner if your blood pressures are not in goal range.  Goal blood pressure is less than 130/80  We will call you with your lab results.  Take 2 Aleve twice daily with food for the next week or so for your knee pain.  We will attempt to get the records regarding your x-ray and knee exam from [place].  Let me know if your pain or symptoms are getting worse.    DASH Eating Plan DASH stands for "Dietary Approaches to Stop Hypertension." The DASH eating plan is a healthy eating plan that has been shown to reduce high blood pressure (hypertension). It may also reduce your risk for type 2 diabetes, heart disease, and stroke. The DASH eating plan may also help with weight loss. What are tips for following this plan? General guidelines  Avoid eating more than 2,300 mg (milligrams) of salt (sodium) a day. If you have hypertension, you may need to reduce your sodium intake to 1,500 mg a day.  Limit alcohol intake to no more than 1 drink a day for nonpregnant women and 2 drinks a day for men. One drink equals 12 oz of beer, 5 oz of wine, or 1 oz of hard liquor.  Work with your health care provider to maintain a healthy body weight or to lose weight. Ask what an ideal weight is for you.  Get at least 30 minutes of exercise that causes your heart to beat faster (aerobic exercise) most days of the week. Activities may include walking, swimming, or biking.  Work with your health care provider or diet and nutrition specialist (dietitian) to adjust your eating plan to your individual calorie needs. Reading food labels  Check food labels for the amount of sodium per serving. Choose foods with less than 5 percent of the Daily Value of sodium. Generally, foods with  less than 300 mg of sodium per serving fit into this eating plan.  To find whole grains, look for the word "whole" as the first word in the ingredient list. Shopping  Buy products labeled as "low-sodium" or "no salt added."  Buy fresh foods. Avoid canned foods and premade or frozen meals. Cooking  Avoid adding salt when cooking. Use salt-free seasonings or herbs instead of table salt or sea salt. Check with your health care provider or pharmacist before using salt substitutes.  Do not fry foods. Cook foods using healthy methods such as baking, boiling, grilling, and broiling instead.  Cook with heart-healthy oils, such as olive, canola, soybean, or sunflower oil. Meal planning   Eat a balanced diet that includes: ? 5 or more servings of fruits and vegetables each day. At each meal, try to fill half of your plate with fruits and vegetables. ? Up to 6-8 servings of whole grains each day. ? Less than 6 oz of lean meat, poultry, or fish each day. A 3-oz serving of meat is about the same size as a deck of cards. One egg equals 1 oz. ? 2 servings of low-fat dairy each day. ? A serving of nuts, seeds, or beans 5 times each week. ? Heart-healthy fats. Healthy fats called Omega-3 fatty acids are found in foods such as flaxseeds and coldwater fish, like sardines, salmon, and mackerel.  Limit how much you eat of the following: ? Canned or prepackaged foods. ? Food that is high in trans fat, such as fried foods. ? Food that is high in saturated fat, such as fatty meat. ? Sweets, desserts, sugary drinks, and other foods with added sugar. ? Full-fat dairy products.  Do not salt foods before eating.  Try to eat at least 2 vegetarian meals each week.  Eat more home-cooked food and less restaurant, buffet, and fast food.  When eating at a restaurant, ask that your food be prepared with less salt or no salt, if possible. What foods are recommended? The items listed may not be a complete list.  Talk with your dietitian about what dietary choices are best for you. Grains Whole-grain or whole-wheat bread. Whole-grain or whole-wheat pasta. Brown rice. [place]. Bulgur. Whole-grain and low-sodium cereals. Pita bread. Low-fat, low-sodium crackers. Whole-wheat flour tortillas. Vegetables Fresh or frozen vegetables (raw, steamed, roasted, or grilled). Low-sodium or reduced-sodium tomato and vegetable juice. Low-sodium or reduced-sodium tomato sauce and tomato paste. Low-sodium or reduced-sodium canned vegetables. Fruits All fresh, dried, or frozen fruit. Canned fruit in natural juice (without added sugar). Meat and other protein foods Skinless chicken or [place]. Ground chicken or [place]. Pork with fat trimmed off. Fish and seafood. Egg whites. Dried beans, peas, or lentils. Unsalted nuts, nut butters, and seeds. Unsalted canned beans. Lean cuts of beef with fat trimmed off. Low-sodium, lean deli meat. Dairy Low-fat (1%) or fat-free (skim) milk. Fat-free, low-fat, or reduced-fat cheeses. Nonfat, low-sodium ricotta or cottage cheese. Low-fat or nonfat yogurt. Low-fat, low-sodium cheese. Fats and oils Soft margarine without trans fats. Vegetable oil. Low-fat, reduced-fat, or light mayonnaise and salad dressings (reduced-sodium). Canola, safflower, olive, soybean, and sunflower oils. Avocado. Seasoning and other foods Herbs. Spices. Seasoning mixes without salt. Unsalted popcorn and pretzels. Fat-free sweets. What foods are not recommended? The items listed may not be a complete list. Talk with your dietitian about what dietary choices are best for you. Grains Baked goods made with fat, such as croissants, muffins, or some breads. Dry pasta or rice meal packs. Vegetables Creamed or fried vegetables. Vegetables in a cheese sauce. Regular canned vegetables (not low-sodium or reduced-sodium). Regular canned tomato sauce and paste (not low-sodium or reduced-sodium). Regular tomato and vegetable  juice (not low-sodium or reduced-sodium). Angie Fava. Olives. Fruits Canned fruit in a light or heavy syrup. Fried fruit. Fruit in cream or butter sauce. Meat and other protein foods Fatty cuts of meat. Ribs. Fried meat. Berniece Salines. Sausage. Bologna and other processed lunch meats. Salami. Fatback. Hotdogs. Bratwurst. Salted nuts and seeds. Canned beans with added salt. Canned or smoked fish. Whole eggs or egg yolks. Chicken or [place] with skin. Dairy Whole or 2% milk, cream, and half-and-half. Whole or full-fat cream cheese. Whole-fat or sweetened yogurt. Full-fat cheese. Nondairy creamers. Whipped toppings. Processed cheese and cheese spreads. Fats and oils Butter. Stick margarine. Lard. Shortening. Ghee. Bacon fat. Tropical oils, such as coconut, palm kernel, or palm oil. Seasoning and other foods Salted popcorn and pretzels. Onion salt, garlic salt, seasoned salt, table salt, and sea salt. Worcestershire sauce. Tartar sauce. Barbecue sauce. Teriyaki sauce. Soy sauce, including reduced-sodium. Steak sauce. Canned and packaged gravies. Fish sauce. Oyster sauce. Cocktail sauce. Horseradish that you find on the shelf. Ketchup. Mustard. Meat flavorings and tenderizers. Bouillon cubes. Hot sauce and Tabasco sauce. Premade or packaged marinades. Premade or packaged taco seasonings. Relishes. Regular salad dressings. Where to find more information:  National Heart, Lung, and Blood  Institute: https://wilson-eaton.com/  American Heart Association: www.heart.org Summary  The DASH eating plan is a healthy eating plan that has been shown to reduce high blood pressure (hypertension). It may also reduce your risk for type 2 diabetes, heart disease, and stroke.  With the DASH eating plan, you should limit salt (sodium) intake to 2,300 mg a day. If you have hypertension, you may need to reduce your sodium intake to 1,500 mg a day.  When on the DASH eating plan, aim to eat more fresh fruits and vegetables, whole grains,  lean proteins, low-fat dairy, and heart-healthy fats.  Work with your health care provider or diet and nutrition specialist (dietitian) to adjust your eating plan to your individual calorie needs. This information is not intended to replace advice given to you by your health care provider. Make sure you discuss any questions you have with your health care provider. Document Released: 09/06/2011 Document Revised: 09/10/2016 Document Reviewed: 09/10/2016 Elsevier Interactive Patient Education  2017 [employer].

## 2017-09-25 ENCOUNTER — Telehealth: Payer: Self-pay | Admitting: Family Medicine

## 2017-09-25 NOTE — Telephone Encounter (Signed)
Rcvd office notes, labs, vaccines, mammogram from [facility]

## 2017-11-11 ENCOUNTER — Encounter: Payer: Self-pay | Admitting: Family Medicine

## 2017-11-11 ENCOUNTER — Ambulatory Visit: Payer: BC Managed Care – PPO | Admitting: Family Medicine

## 2017-11-11 VITALS — BP 160/102 | HR 81 | Temp 99.1°F | Wt 257.6 lb

## 2017-11-11 DIAGNOSIS — H8110 Benign paroxysmal vertigo, unspecified ear: Secondary | ICD-10-CM | POA: Diagnosis not present

## 2017-11-11 DIAGNOSIS — R42 Dizziness and giddiness: Secondary | ICD-10-CM

## 2017-11-11 DIAGNOSIS — R251 Tremor, unspecified: Secondary | ICD-10-CM

## 2017-11-11 DIAGNOSIS — E041 Nontoxic single thyroid nodule: Secondary | ICD-10-CM

## 2017-11-11 DIAGNOSIS — I10 Essential (primary) hypertension: Secondary | ICD-10-CM | POA: Diagnosis not present

## 2017-11-11 DIAGNOSIS — R7303 Prediabetes: Secondary | ICD-10-CM | POA: Insufficient documentation

## 2017-11-11 DIAGNOSIS — R7989 Other specified abnormal findings of blood chemistry: Secondary | ICD-10-CM | POA: Diagnosis not present

## 2017-11-11 LAB — POCT URINALYSIS DIP (PROADVANTAGE DEVICE)
Bilirubin, UA: NEGATIVE
Blood, UA: NEGATIVE
Glucose, UA: NEGATIVE mg/dL
Ketones, POC UA: NEGATIVE mg/dL
Leukocytes, UA: NEGATIVE
Nitrite, UA: NEGATIVE
Protein Ur, POC: NEGATIVE mg/dL
Specific Gravity, Urine: 1.025
Urobilinogen, Ur: NEGATIVE
pH, UA: 6

## 2017-11-11 LAB — POCT GLYCOSYLATED HEMOGLOBIN (HGB A1C): Hemoglobin A1C: 5.8

## 2017-11-11 LAB — GLUCOSE, POCT (MANUAL RESULT ENTRY): POC Glucose: 120 mg/dL — AB (ref 70–99)

## 2017-11-11 MED ORDER — MECLIZINE HCL 25 MG PO TABS: 25.0000 mg | ORAL_TABLET | Freq: Two times a day (BID) | ORAL | 0 refills | Status: DC | PRN

## 2017-11-11 MED ORDER — HYDROCHLOROTHIAZIDE 12.5 MG PO TABS: 12.5000 mg | ORAL_TABLET | Freq: Every day | ORAL | 1 refills | Status: DC

## 2017-11-11 NOTE — Progress Notes (Signed)
Subjective:    Patient ID: Kim Tanner, female    DOB: [birth year], 47 y.o.   MRN: [id number]  HPI Chief Complaint  Patient presents with  . not feeling well    not feeling well and dizzy for 3 weeks. having dizzy spells with bp med so stopped bp med but has since then had this going on too. feels shaky as well. last spell was 3 days ago   Complains of a 1 month history of intermittent dizziness that she describes as a spinning sensation. Symptoms have been occurring every other day and triggered by position changes. Symptoms last approximately 10 minutes to 2 hours. States she drinks tea and lays down to get it to away. Last episode was 3 days ago.  Reports associated nausea occasionally. She also has been feeling "shaky" at times.  She did try a "vertigo" medication that her family member gave her and states this medication did help resolve her symptoms.    Denies numbness, tingling or weakness. No tinnitus, changes in hearing or speech.   States she stopped her BP medication 3-4 weeks ago because she thought this was the cause of her dizziness.  She was prescribed alodipine in October 2018. States even when she was taking the amlodipine her BP was not under good control but she did not let me know.   States she has improved her diet and is walking more.   Periods are irregular, every other month. This is not new.   She was referred to endocrinology for further evaluation of thyroid nodule and low TSH. States she did not go and was told she was need a new referral to be seen.   Denies fever, chills, chest pain, palpitations, shortness of breath, abdominal pain, N/V/D, urinary symptoms, LE edema.   Reviewed allergies, medications, past medical, surgical, family, and social history.   Review of Systems Pertinent positives and negatives in the history of present illness.     Objective:   Physical Exam  Constitutional: She is oriented to person, place, and time. She appears  well-developed and well-nourished. No distress.  HENT:  Right Ear: Tympanic membrane and ear canal normal.  Left Ear: Tympanic membrane and ear canal normal.  Eyes: Conjunctivae, EOM and lids are normal. Pupils are equal, round, and reactive to light.  Neck: Normal range of motion. Neck supple.  Cardiovascular: Normal rate, regular rhythm, normal heart sounds and intact distal pulses. Exam reveals no gallop.  No murmur heard. No LE edema  Pulmonary/Chest: Effort normal and breath sounds normal.  Musculoskeletal: Normal range of motion.  Lymphadenopathy:    She has no cervical adenopathy.  Neurological: She is alert and oriented to person, place, and time. She has normal strength and normal reflexes. She displays no tremor. No cranial nerve deficit or sensory deficit. She displays a negative Romberg sign. Coordination and gait normal.  Skin: Skin is warm and dry. No pallor.  Psychiatric: She has a normal mood and affect. Her speech is normal and behavior is normal. Thought content normal.   BP (!) 160/102   Pulse 81   Temp 99.1 F (37.3 C) (Oral)   Wt 257 lb 9.6 oz (116.8 kg)   BMI 48.67 kg/m       Assessment & Plan:  Essential hypertension - Plan: hydrochlorothiazide (HYDRODIURIL) 12.5 MG tablet  Prediabetes - Plan: POCT Urinalysis DIP (Proadvantage Device), Glucose (CBG), HgB A1c  Right thyroid nodule - Plan: Ambulatory referral to Endocrinology  Low TSH level - Plan:  Ambulatory referral to Endocrinology  Benign paroxysmal positional vertigo, unspecified laterality - Plan: meclizine (ANTIVERT) 25 MG tablet  Shaky - Plan: POCT Urinalysis DIP (Proadvantage Device), Glucose (CBG), HgB A1c  Dizzy spells - Plan: POCT Urinalysis DIP (Proadvantage Device), Glucose (CBG), HgB A1c  She appears to have BPPV. Normal neurological exam. Unable to illicit symptoms. Meclizine prescribed and advised to use caution in case of drowsiness.  Last ate 4 hours ago and POCT glucose finger stick  120 Hgb A1c 5.8. Counseled on pre diabetes and risk of developing diabetes.  History of low TSH on at least 2 occasions and thyroid nodule. [name] of nodule did show no follow up needed. She will follow up with endocrinology .  Discussed that her HTN is uncontrolled and recommend she start back on amlodipine and add HCTZ. Advised to eat a low sodium diet.  She will check her BP daily and bring in her readings and BP machine in 2 weeks.

## 2017-11-11 NOTE — Patient Instructions (Addendum)
If you have the dizziness again, you can take 1/2 tablet (12.5 mg) of meclizine and see if this helps. This medication can be sedating so be careful if you take the whole tablet.  Avoid alcohol and stay well hydrated.   Your blood pressure is not well controlled.  Start back on the amlodipine and the new low dose fluid pill.  Check your blood pressure daily.  Return in 2 weeks with your BP readings and BP cuff.     Benign Positional Vertigo Vertigo is the feeling that you or your surroundings are moving when they are not. Benign positional vertigo is the most common form of vertigo. The cause of this condition is not serious (is benign). This condition is triggered by certain movements and positions (is positional). This condition can be dangerous if it occurs while you are doing something that could endanger you or others, such as driving. What are the causes? In many cases, the cause of this condition is not known. It may be caused by a disturbance in an area of the inner ear that helps your brain to sense movement and balance. This disturbance can be caused by a viral infection (labyrinthitis), head injury, or repetitive motion. What increases the risk? This condition is more likely to develop in:  Women.  People who are 66 years of age or older.  What are the signs or symptoms? Symptoms of this condition usually happen when you move your head or your eyes in different directions. Symptoms may start suddenly, and they usually last for less than a minute. Symptoms may include:  Loss of balance and falling.  Feeling like you are spinning or moving.  Feeling like your surroundings are spinning or moving.  Nausea and vomiting.  Blurred vision.  Dizziness.  Involuntary eye movement (nystagmus).  Symptoms can be mild and cause only slight annoyance, or they can be severe and interfere with daily life. Episodes of benign positional vertigo may return (recur) over time, and they may  be triggered by certain movements. Symptoms may improve over time. How is this diagnosed? This condition is usually diagnosed by medical history and a physical exam of the head, neck, and ears. You may be referred to a health care provider who specializes in ear, nose, and throat (ENT) problems (otolaryngologist) or a provider who specializes in disorders of the nervous system (neurologist). You may have additional testing, including:  MRI.  A CT scan.  Eye movement tests. Your health care provider may ask you to change positions quickly while he or she watches you for symptoms of benign positional vertigo, such as nystagmus. Eye movement may be tested with an electronystagmogram (ENG), caloric stimulation, the Dix-Hallpike test, or the roll test.  An electroencephalogram (EEG). This records electrical activity in your brain.  Hearing tests.  How is this treated? Usually, your health care provider will treat this by moving your head in specific positions to adjust your inner ear back to normal. Surgery may be needed in severe cases, but this is rare. In some cases, benign positional vertigo may resolve on its own in 2-4 weeks. Follow these instructions at home: Safety  Move slowly.Avoid sudden body or head movements.  Avoid driving.  Avoid operating heavy machinery.  Avoid doing any tasks that would be dangerous to you or others if a vertigo episode would occur.  If you have trouble walking or keeping your balance, try using a cane for stability. If you feel dizzy or unstable, sit down right  away.  Return to your normal activities as told by your health care provider. Ask your health care provider what activities are safe for you. General instructions  Take over-the-counter and prescription medicines only as told by your health care provider.  Avoid certain positions or movements as told by your health care provider.  Drink enough fluid to keep your urine clear or pale  yellow.  Keep all follow-up visits as told by your health care provider. This is important. Contact a health care provider if:  You have a fever.  Your condition gets worse or you develop new symptoms.  Your family or friends notice any behavioral changes.  Your nausea or vomiting gets worse.  You have numbness or a "pins and needles" sensation. Get help right away if:  You have difficulty speaking or moving.  You are always dizzy.  You faint.  You develop severe headaches.  You have weakness in your legs or arms.  You have changes in your hearing or vision.  You develop a stiff neck.  You develop sensitivity to light. This information is not intended to replace advice given to you by your health care provider. Make sure you discuss any questions you have with your health care provider. Document Released: 06/25/2006 Document Revised: 02/23/2016 Document Reviewed: 01/10/2015 Elsevier Interactive Patient Education  [employer].

## 2017-11-21 ENCOUNTER — Ambulatory Visit: Payer: BC Managed Care – PPO | Admitting: Family Medicine

## 2017-11-25 ENCOUNTER — Ambulatory Visit: Payer: BC Managed Care – PPO | Admitting: Family Medicine

## 2017-11-25 ENCOUNTER — Encounter: Payer: Self-pay | Admitting: Family Medicine

## 2017-11-25 VITALS — BP 140/82 | HR 87 | Wt 254.0 lb

## 2017-11-25 DIAGNOSIS — I10 Essential (primary) hypertension: Secondary | ICD-10-CM | POA: Diagnosis not present

## 2017-11-25 MED ORDER — AMLODIPINE BESYLATE 5 MG PO TABS: 5.0000 mg | ORAL_TABLET | Freq: Every day | ORAL | 2 refills | Status: DC

## 2017-11-25 NOTE — Progress Notes (Signed)
   Subjective:    Patient ID: Kim Tanner, female    DOB: August 08, 1971, 47 y.o.   MRN: [id number]  HPI Chief Complaint  Patient presents with  . follow-up on HTN    follow-up on HTN   She is here to follow up on HTN. She had stopped amlodipine at her last visit with me and we did start her back on this and also added HCTZ due to her significantly elevated readings.  Reports good medication compliance since our last visit.  States she has cut back on portion sizes and sodas but still has a "cheat day each week". States she ate 2 slices of cake yesterday.   She brought in her BP from home and this is reading falsely high due to being too small for her arm.   Denies fever, chills, dizziness, chest pain, palpitations, shortness of breath, abdominal pain, N/V/D, urinary symptoms, LE edema.   Reviewed allergies, medications, past medical, surgical, family, and social history.   Review of Systems Pertinent positives and negatives in the history of present illness.     Objective:   Physical Exam BP 140/82   Pulse 87   Wt 254 lb (115.2 kg)   BMI 47.99 kg/m    Alert and oriented and in no acute distress. Not otherwise examined.        Assessment & Plan:  Essential hypertension - Plan: amLODipine (NORVASC) 5 MG tablet  Morbid obesity (HCC)  Her BP appears to be responding to her medications and she is doing well on these. She has lost 3 lbs and I did congratulate her on this. Encouraged her to continue to cut back on carbohydrates, portion sizes and salty foods and increase her physical activity.  Discussed that her home BP cuff is too small therefore she is getting falsely elevated readings.  She will get a cuff that fits her arm and continue to check her BP. She will alert me if her readings are not at goal.  She does have an appointment with Dr. Cruzita Lederer in April for low TSH and does not want to have to come back here until June. I am ok with this as long as she keeps an eye  on her HTN.  She is aware that she is also pre diabetic and this means her risk of developing diabetes in the future is greater.  Recommend a fasting CPE and HTN follow up in May or June.

## 2017-11-25 NOTE — Patient Instructions (Signed)
Continue on your current medications. Get a BP cuff that fits your arm and keep an eye on your BP readings. If you are not seeing readings <130/80 on a regular basis then let me know.

## 2017-12-30 LAB — HM PAP SMEAR: HM Pap smear: NEGATIVE

## 2018-01-16 ENCOUNTER — Ambulatory Visit: Payer: PRIVATE HEALTH INSURANCE | Admitting: Internal Medicine

## 2018-02-24 NOTE — Progress Notes (Signed)
Subjective:    Patient ID: Kim Tanner, female    DOB: 1971-05-06, 47 y.o.   MRN: [id number]  HPI Chief Complaint  Patient presents with  . fasting cpe    fasting cpe, stopped taking amlopidine med due to swelling in ankles, sees obgyn   She is here for a complete physical exam and to follow up on chronic health conditions.  Last CPE: March 2018  Other providers: [facility] smear, STD testing 1-2 months ago.   She has been self medicating and increased her HCTZ from 12.5 mg to 25 mg 3-4 weeks ago and stopped amlodipine at the same time due to bilateral LE edema and this has since resolved.   Asthma - reports using Advair only 2 times per week. Using her albuterol everyday due to allergies. Is not treating her allergies.   Hgb A1c 5.8% 11/11/2017  Low TSH and normal free T4. Referred to [facility] but she did not go. No explanation for not seeing her.    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 States she plans to do this.  Colonoscopy: 2016 per patient  Last Gynecological Exam: 1-2 months ago. Pap smear up to date Last Menstrual cycle: 2017- ablation  Last Dental Exam: 3-4 years ago  Last Eye Exam: years ago   Wears seatbelt always, uses sunscreen, smoke detectors in home and functioning, does not text while driving and feels safe in home environment.   Reviewed allergies, medications, past medical, surgical, family, and social history.   Review of Systems Review of Systems Constitutional: -fever, -chills, -sweats, -unexpected weight change,-fatigue ENT: -runny nose, -ear pain, -sore throat Cardiology:  -chest pain, -palpitations, -edema Respiratory: +cough, -shortness of breath,  +wheezing Gastroenterology: -abdominal pain, -nausea, -vomiting, -diarrhea, -constipation  Hematology: -bleeding, + bruising problems Musculoskeletal: -arthralgias, -myalgias, -joint swelling, -back pain Ophthalmology: -vision changes Urology: -dysuria, -difficulty urinating, -hematuria, -urinary frequency, -urgency Neurology: -headache, -weakness, -tingling, -numbness       Objective:   Physical Exam BP 122/80   Pulse 92   Ht 5' 1.75" (1.568 m)   Wt 254 lb (115.2 kg)   BMI 46.83 kg/m   General Appearance:    Alert, cooperative, no distress, appears stated age  Head:    Normocephalic, without obvious abnormality, atraumatic  Eyes:    PERRL, conjunctiva/corneas clear, EOM's intact, fundi    benign  Ears:    Normal TM's and external ear canals  Nose:   Nares normal, mucosa normal, no drainage or sinus   tenderness  Throat:   Lips, mucosa, and tongue normal; teeth and gums normal  Neck:   Supple, no lymphadenopathy;  thyroid:  no   enlargement/tenderness/nodules; no carotid   bruit or JVD  Back:    Spine nontender, no curvature, ROM normal, no CVA     tenderness  Lungs:     Clear to auscultation bilaterally without wheezes, rales or     ronchi; respirations unlabored  Chest Wall:    No tenderness or deformity   Heart:    Regular rate and rhythm, S1 and S2 normal, no murmur, rub   or gallop  Breast Exam:    Declines. Mammogram ordered.   Abdomen:     Soft, non-tender, nondistended,  normoactive bowel sounds,    no masses, no hepatosplenomegaly  Genitalia:    Declines- done at [facility] per patient   Rectal:    Normal tone, no masses or tenderness; guaiac negative stool  Extremities:   No clubbing, cyanosis or edema  Pulses:   2+ and symmetric all extremities  Skin:   Skin color, texture, turgor normal, no rashes or lesions  Lymph nodes:   Cervical, supraclavicular, and axillary nodes normal  Neurologic:   CNII-XII intact, normal strength, sensation and gait; reflexes 2+  and symmetric throughout          Psych:   Normal mood, affect, hygiene and grooming.    Urinalysis dipstick: negative       Assessment & Plan:  Routine general medical examination at a health care facility - Plan: POCT Urinalysis DIP (Proadvantage Device), CBC with Differential/Platelet, Comprehensive metabolic panel, Lipid panel  Prediabetes - Plan: Hemoglobin A1c  Low TSH level - Plan: T4, free, TSH  Morbid obesity (HCC) - Plan: Lipid panel  Essential hypertension  Mild intermittent asthma without complication  Medication management  Vaccine counseling  Screening for diabetes mellitus  Family history of diabetes mellitus in first degree relative - Plan: Hemoglobin A1c  Need for Tdap vaccination - Plan: Tdap vaccine greater than or equal to 7yo IM  Screening for breast cancer  Hypertension appears to be well controlled.  Advised her against self-medicating.  Strongly encouraged her to contact me if she feels like she is having side effects from her medications or decides to make changes in her medication regimen.  She will continue on HCTZ 25 mg recommend she continue checking her blood pressure at home.  Report back if her pressures are not in goal range. She has made some healthy changes to her diet but still appears to be eating and healthy and excessive calorie diet.  Counseling done on healthy lifestyle modifications for weight loss and for chronic health conditions.  TSH has been low With a normal free T4.  Reviewed thyroid ultrasound.  She does not plan on following up with endocrinology.  We will recheck thyroid function panel. Asthma does not appear to be well controlled.  Discussed starting back on Advair daily and also trying a nondrowsy antihistamine to help control allergies.  Recommend that if she is still needing her albuterol inhaler more than once weekly that she follow up with me. History of prediabetes.  Will check hemoglobin A1c. She did not get her  mammogram last year and is aware that she should contact the breast center to schedule this. Tdap given.  Counseling done on all components of the vaccine. Discussed safety and health promotion. Follow-up pending labs or in 3 months.

## 2018-02-25 ENCOUNTER — Ambulatory Visit: Payer: BC Managed Care – PPO | Admitting: Family Medicine

## 2018-02-25 ENCOUNTER — Encounter: Payer: Self-pay | Admitting: Family Medicine

## 2018-02-25 ENCOUNTER — Telehealth: Payer: Self-pay | Admitting: Family Medicine

## 2018-02-25 VITALS — BP 122/80 | HR 92 | Ht 61.75 in | Wt 254.0 lb

## 2018-02-25 DIAGNOSIS — Z0000 Encounter for general adult medical examination without abnormal findings: Secondary | ICD-10-CM

## 2018-02-25 DIAGNOSIS — I10 Essential (primary) hypertension: Secondary | ICD-10-CM | POA: Diagnosis not present

## 2018-02-25 DIAGNOSIS — R7989 Other specified abnormal findings of blood chemistry: Secondary | ICD-10-CM | POA: Diagnosis not present

## 2018-02-25 DIAGNOSIS — Z7189 Other specified counseling: Secondary | ICD-10-CM | POA: Diagnosis not present

## 2018-02-25 DIAGNOSIS — Z23 Encounter for immunization: Secondary | ICD-10-CM

## 2018-02-25 DIAGNOSIS — Z833 Family history of diabetes mellitus: Secondary | ICD-10-CM

## 2018-02-25 DIAGNOSIS — Z79899 Other long term (current) drug therapy: Secondary | ICD-10-CM | POA: Diagnosis not present

## 2018-02-25 DIAGNOSIS — J4520 Mild intermittent asthma, uncomplicated: Secondary | ICD-10-CM

## 2018-02-25 DIAGNOSIS — R7303 Prediabetes: Secondary | ICD-10-CM | POA: Diagnosis not present

## 2018-02-25 DIAGNOSIS — Z1231 Encounter for screening mammogram for malignant neoplasm of breast: Secondary | ICD-10-CM

## 2018-02-25 DIAGNOSIS — Z7185 Encounter for immunization safety counseling: Secondary | ICD-10-CM

## 2018-02-25 DIAGNOSIS — Z1239 Encounter for other screening for malignant neoplasm of breast: Secondary | ICD-10-CM

## 2018-02-25 DIAGNOSIS — Z131 Encounter for screening for diabetes mellitus: Secondary | ICD-10-CM

## 2018-02-25 LAB — POCT URINALYSIS DIP (PROADVANTAGE DEVICE)
Bilirubin, UA: NEGATIVE
Blood, UA: NEGATIVE
Glucose, UA: NEGATIVE mg/dL
Ketones, POC UA: NEGATIVE mg/dL
Leukocytes, UA: NEGATIVE
Nitrite, UA: NEGATIVE
Protein Ur, POC: NEGATIVE mg/dL
Specific Gravity, Urine: 1.03
Urobilinogen, Ur: NEGATIVE
pH, UA: 5.5 (ref 5.0–8.0)

## 2018-02-25 MED ORDER — HYDROCHLOROTHIAZIDE 25 MG PO TABS: 25.0000 mg | ORAL_TABLET | Freq: Every day | ORAL | 1 refills | Status: DC

## 2018-02-25 NOTE — Patient Instructions (Addendum)
Start using Advair daily as prescribed. If you are still having issues with cough and wheezing then try Xyzal or Zyrtec or Claritin or Allegra.   Let me know if you are doing all of this and still needing your albuterol more than once weekly.   Continue checking your BP at home. If your readings are not in goal range <130/80 then let me know.   Start using a free app such as My Fitness Pal to track your calories. Cut back on portion sizes and carbohydrates.   Call and schedule your mammogram.   Preventative Care for Adults - Female      MAINTAIN REGULAR HEALTH EXAMS:  A routine yearly physical is a good way to check in with your primary care provider about your health and preventive screening. It is also an opportunity to share updates about your health and any concerns you have, and receive a thorough all-over exam.   Most health insurance companies pay for at least some preventative services.  Check with your health plan for specific coverages.  WHAT PREVENTATIVE SERVICES DO WOMEN NEED?  Adult women should have their weight and blood pressure checked regularly.   Women age 68 and older should have their cholesterol levels checked regularly.  Women should be screened for cervical cancer with a Pap smear and pelvic exam beginning at either age 36, or 3 years after they become sexually activity.    Breast cancer screening generally begins at age 70 with a mammogram and breast exam by your primary care provider.    Beginning at age 85 and continuing to age 32, women should be screened for colorectal cancer.  Certain people may need continued testing until age 88.  Updating vaccinations is part of preventative care.  Vaccinations help protect against diseases such as the flu.  Osteoporosis is a disease in which the bones lose minerals and strength as we age. Women ages 80 and over should discuss this with their caregivers, as should women after menopause who have other risk  factors.  Lab tests are generally done as part of preventative care to screen for anemia and blood disorders, to screen for problems with the kidneys and liver, to screen for bladder problems, to check blood sugar, and to check your cholesterol level.  Preventative services generally include counseling about diet, exercise, avoiding tobacco, drugs, excessive alcohol consumption, and sexually transmitted infections.    GENERAL RECOMMENDATIONS FOR GOOD HEALTH:  Healthy diet:  Eat a variety of foods, including fruit, vegetables, animal or vegetable protein, such as meat, fish, chicken, and eggs, or beans, lentils, tofu, and grains, such as rice.  Drink plenty of water daily.  Decrease saturated fat in the diet, avoid lots of red meat, processed foods, sweets, fast foods, and fried foods.  Exercise:  Aerobic exercise helps maintain good heart health. At least 30-40 minutes of moderate-intensity exercise is recommended. For example, a brisk walk that increases your heart rate and breathing. This should be done on most days of the week.   Find a type of exercise or a variety of exercises that you enjoy so that it becomes a part of your daily life.  Examples are running, walking, swimming, water aerobics, and biking.  For motivation and support, explore group exercise such as aerobic class, spin class, Zumba, Yoga,or  martial arts, etc.    Set exercise goals for yourself, such as a certain weight goal, walk or run in a race such as a 5k walk/run.  Speak to  your primary care provider about exercise goals.  Disease prevention:  If you smoke or chew tobacco, find out from your caregiver how to quit. It can literally save your life, no matter how long you have been a tobacco user. If you do not use tobacco, never begin.   Maintain a healthy diet and normal weight. Increased weight leads to problems with blood pressure and diabetes.   The Body Mass Index or BMI is a way of measuring how much of  your body is fat. Having a BMI above 27 increases the risk of heart disease, diabetes, hypertension, stroke and other problems related to obesity. Your caregiver can help determine your BMI and based on it develop an exercise and dietary program to help you achieve or maintain this important measurement at a healthful level.  High blood pressure causes heart and blood vessel problems.  Persistent high blood pressure should be treated with medicine if weight loss and exercise do not work.   Fat and cholesterol leaves deposits in your arteries that can block them. This causes heart disease and vessel disease elsewhere in your body.  If your cholesterol is found to be high, or if you have heart disease or certain other medical conditions, then you may need to have your cholesterol monitored frequently and be treated with medication.   Ask if you should have a cardiac stress test if your history suggests this. A stress test is a test done on a treadmill that looks for heart disease. This test can find disease prior to there being a problem.  Menopause can be associated with physical symptoms and risks. Hormone replacement therapy is available to decrease these. You should talk to your caregiver about whether starting or continuing to take hormones is right for you.   Osteoporosis is a disease in which the bones lose minerals and strength as we age. This can result in serious bone fractures. Risk of osteoporosis can be identified using a bone density scan. Women ages 10 and over should discuss this with their caregivers, as should women after menopause who have other risk factors. Ask your caregiver whether you should be taking a calcium supplement and Vitamin D, to reduce the rate of osteoporosis.   Avoid drinking alcohol in excess (more than two drinks per day).  Avoid use of street drugs. Do not share needles with anyone. Ask for professional help if you need assistance or instructions on stopping the use  of alcohol, cigarettes, and/or drugs.  Brush your teeth twice a day with fluoride toothpaste, and floss once a day. Good oral hygiene prevents tooth decay and gum disease. The problems can be painful, unattractive, and can cause other health problems. Visit your dentist for a routine oral and dental check up and preventive care every 6-12 months.   Look at your skin regularly.  Use a mirror to look at your back. Notify your caregivers of changes in moles, especially if there are changes in shapes, colors, a size larger than a pencil eraser, an irregular border, or development of new moles.  Safety:  Use seatbelts 100% of the time, whether driving or as a passenger.  Use safety devices such as hearing protection if you work in environments with loud noise or significant background noise.  Use safety glasses when doing any work that could send debris in to the eyes.  Use a helmet if you ride a bike or motorcycle.  Use appropriate safety gear for contact sports.  Talk to your caregiver  about gun safety.  Use sunscreen with a SPF (or skin protection factor) of 15 or greater.  Lighter skinned people are at a greater risk of skin cancer. Don't forget to also wear sunglasses in order to protect your eyes from too much damaging sunlight. Damaging sunlight can accelerate cataract formation.   Practice safe sex. Use condoms. Condoms are used for birth control and to help reduce the spread of sexually transmitted infections (or STIs).  Some of the STIs are gonorrhea (the clap), chlamydia, syphilis, trichomonas, herpes, HPV (human papilloma virus) and HIV (human immunodeficiency virus) which causes AIDS. The herpes, HIV and HPV are viral illnesses that have no cure. These can result in disability, cancer and death.   Keep carbon monoxide and smoke detectors in your home functioning at all times. Change the batteries every 6 months or use a model that plugs into the wall.   Vaccinations:  Stay up to date with  your tetanus shots and other required immunizations. You should have a booster for tetanus every 10 years. Be sure to get your flu shot every year, since 5%-20% of the [place] population comes down with the flu. The flu vaccine changes each year, so being vaccinated once is not enough. Get your shot in the fall, before the flu season peaks.   Other vaccines to consider:  Human Papilloma Virus or HPV causes cancer of the cervix, and other infections that can be transmitted from person to person. There is a vaccine for HPV, and females should get immunized between the ages of 55 and 73. It requires a series of 3 shots.   Pneumococcal vaccine to protect against certain types of pneumonia.  This is normally recommended for adults age 69 or older.  However, adults younger than 45 years old with certain underlying conditions such as diabetes, heart or lung disease should also receive the vaccine.  Shingles vaccine to protect against Varicella Zoster if you are older than age 45, or younger than 45 years old with certain underlying illness.  Hepatitis A vaccine to protect against a form of infection of the liver by a virus acquired from food.  Hepatitis B vaccine to protect against a form of infection of the liver by a virus acquired from blood or body fluids, particularly if you work in health care.  If you plan to travel internationally, check with your local health department for specific vaccination recommendations.  Cancer Screening:  Breast cancer screening is essential to preventive care for women. All women age 61 and older should perform a breast self-exam every month. At age 54 and older, women should have their caregiver complete a breast exam each year. Women at ages 69 and older should have a mammogram (x-ray film) of the breasts. Your caregiver can discuss how often you need mammograms.    Cervical cancer screening includes taking a Pap smear (sample of cells examined under a microscope) from  the cervix (end of the uterus). It also includes testing for HPV (Human Papilloma Virus, which can cause cervical cancer). Screening and a pelvic exam should begin at age 41, or 3 years after a woman becomes sexually active. Screening should occur every year, with a Pap smear but no HPV testing, up to age 32. After age 51, you should have a Pap smear every 3 years with HPV testing, if no HPV was found previously.   Most routine colon cancer screening begins at the age of 76. On a yearly basis, doctors may provide special easy  to use take-home tests to check for hidden blood in the stool. Sigmoidoscopy or colonoscopy can detect the earliest forms of colon cancer and is life saving. These tests use a small camera at the end of a tube to directly examine the colon. Speak to your caregiver about this at age 82, when routine screening begins (and is repeated every 5 years unless early forms of pre-cancerous polyps or small growths are found).

## 2018-02-25 NOTE — Telephone Encounter (Signed)
[facility]. Sending back for review.

## 2018-02-26 LAB — CBC WITH DIFFERENTIAL/PLATELET
Basophils Absolute: 0 10*3/uL (ref 0.0–0.2)
Basos: 0 %
EOS (ABSOLUTE): 0.2 10*3/uL (ref 0.0–0.4)
Eos: 3 %
Hematocrit: 37.5 % (ref 34.0–46.6)
Hemoglobin: 12.5 g/dL (ref 11.1–15.9)
Immature Grans (Abs): 0 10*3/uL (ref 0.0–0.1)
Immature Granulocytes: 0 %
Lymphocytes Absolute: 2.1 10*3/uL (ref 0.7–3.1)
Lymphs: 32 %
MCH: 27.9 pg (ref 26.6–33.0)
MCHC: 33.3 g/dL (ref 31.5–35.7)
MCV: 84 fL (ref 79–97)
Monocytes Absolute: 0.4 10*3/uL (ref 0.1–0.9)
Monocytes: 6 %
Neutrophils Absolute: 3.8 10*3/uL (ref 1.4–7.0)
Neutrophils: 59 %
Platelets: 269 10*3/uL (ref 150–450)
RBC: 4.48 x10E6/uL (ref 3.77–5.28)
RDW: 14.7 % (ref 12.3–15.4)
WBC: 6.5 10*3/uL (ref 3.4–10.8)

## 2018-02-26 LAB — LIPID PANEL
Chol/HDL Ratio: 4.3 ratio (ref 0.0–4.4)
Cholesterol, Total: 175 mg/dL (ref 100–199)
HDL: 41 mg/dL (ref >39)
LDL Calculated: 113 mg/dL — ABNORMAL HIGH (ref 0–99)
Triglycerides: 104 mg/dL (ref 0–149)
VLDL Cholesterol Cal: 21 mg/dL (ref 5–40)

## 2018-02-26 LAB — COMPREHENSIVE METABOLIC PANEL
ALT: 19 IU/L (ref 0–32)
AST: 18 IU/L (ref 0–40)
Albumin/Globulin Ratio: 1.4 (ref 1.2–2.2)
Albumin: 4.1 g/dL (ref 3.5–5.5)
Alkaline Phosphatase: 117 IU/L (ref 39–117)
BUN/Creatinine Ratio: 11 (ref 9–23)
BUN: 9 mg/dL (ref 6–24)
Bilirubin Total: 0.2 mg/dL (ref 0.0–1.2)
CO2: 23 mmol/L (ref 20–29)
Calcium: 9.7 mg/dL (ref 8.7–10.2)
Chloride: 98 mmol/L (ref 96–106)
Creatinine, Ser: 0.84 mg/dL (ref 0.57–1.00)
GFR calc Af Amer: 98 mL/min/{1.73_m2} (ref >59)
GFR calc non Af Amer: 85 mL/min/{1.73_m2} (ref >59)
Globulin, Total: 2.9 g/dL (ref 1.5–4.5)
Glucose: 103 mg/dL — ABNORMAL HIGH (ref 65–99)
Potassium: 3.9 mmol/L (ref 3.5–5.2)
Sodium: 140 mmol/L (ref 134–144)
Total Protein: 7 g/dL (ref 6.0–8.5)

## 2018-02-26 LAB — T4, FREE: Free T4: 1.63 ng/dL (ref 0.82–1.77)

## 2018-02-26 LAB — HEMOGLOBIN A1C
Est. average glucose Bld gHb Est-mCnc: 111 mg/dL
Hgb A1c MFr Bld: 5.5 % (ref 4.8–5.6)

## 2018-02-26 LAB — TSH: TSH: <0.006 u[IU]/mL — ABNORMAL LOW (ref 0.450–4.500)

## 2018-03-13 ENCOUNTER — Telehealth: Payer: Self-pay | Admitting: Family Medicine

## 2018-03-13 ENCOUNTER — Other Ambulatory Visit: Payer: Self-pay | Admitting: Family Medicine

## 2018-03-13 DIAGNOSIS — J4520 Mild intermittent asthma, uncomplicated: Secondary | ICD-10-CM

## 2018-03-13 DIAGNOSIS — H8110 Benign paroxysmal vertigo, unspecified ear: Secondary | ICD-10-CM

## 2018-03-13 MED ORDER — ALBUTEROL SULFATE HFA 108 (90 BASE) MCG/ACT IN AERS: 2.0000 | INHALATION_SPRAY | Freq: Four times a day (QID) | RESPIRATORY_TRACT | 1 refills | Status: AC | PRN

## 2018-03-13 MED ORDER — FLUTICASONE-SALMETEROL 100-50 MCG/DOSE IN AEPB: 1.0000 | INHALATION_SPRAY | Freq: Two times a day (BID) | RESPIRATORY_TRACT | 1 refills | Status: DC

## 2018-03-13 NOTE — Telephone Encounter (Signed)
Pt called and is requesting a refill on her albuterol and her Advair [facility] [place] [place] [facility] [address] [facility] [address] pt can be reached at [phone]

## 2018-03-13 NOTE — Telephone Encounter (Signed)
Ok to refill 

## 2018-03-13 NOTE — Telephone Encounter (Signed)
done

## 2018-03-13 NOTE — Telephone Encounter (Signed)
Is this okay to refill? 

## 2018-08-26 ENCOUNTER — Ambulatory Visit: Payer: BC Managed Care – PPO | Admitting: Family Medicine

## 2018-09-08 ENCOUNTER — Ambulatory Visit: Payer: BC Managed Care – PPO | Admitting: Family Medicine

## 2018-09-16 ENCOUNTER — Ambulatory Visit: Payer: BC Managed Care – PPO | Admitting: Family Medicine

## 2018-09-16 ENCOUNTER — Encounter: Payer: Self-pay | Admitting: Family Medicine

## 2018-09-16 VITALS — BP 140/102 | HR 74 | Wt 263.6 lb

## 2018-09-16 DIAGNOSIS — I10 Essential (primary) hypertension: Secondary | ICD-10-CM | POA: Diagnosis not present

## 2018-09-16 DIAGNOSIS — R7303 Prediabetes: Secondary | ICD-10-CM

## 2018-09-16 DIAGNOSIS — Z9119 Patient's noncompliance with other medical treatment and regimen: Secondary | ICD-10-CM

## 2018-09-16 DIAGNOSIS — R7989 Other specified abnormal findings of blood chemistry: Secondary | ICD-10-CM

## 2018-09-16 DIAGNOSIS — Z91199 Patient's noncompliance with other medical treatment and regimen due to unspecified reason: Secondary | ICD-10-CM | POA: Insufficient documentation

## 2018-09-16 DIAGNOSIS — L918 Other hypertrophic disorders of the skin: Secondary | ICD-10-CM | POA: Insufficient documentation

## 2018-09-16 DIAGNOSIS — Z8639 Personal history of other endocrine, nutritional and metabolic disease: Secondary | ICD-10-CM

## 2018-09-16 HISTORY — DX: Essential (primary) hypertension: I10

## 2018-09-16 NOTE — Patient Instructions (Signed)
Start back on the hydrochlorothiazide.  Start checking your blood pressure daily and if you are not seeing readings improving to less than 130/80 then please let me know.  We may need to add a second medication to your regimen.  Make sure you are eating a low-sodium diet.  Call and schedule your appointment with the [facility] endocrinology Their address is [address]. [place]ie Dapper Their phone number is [phone]  The referral from earlier this year is still in the system.  I am also referring you to [facility] health weight and management.  You will receive a phone call to schedule with them.  This may take a month or two. In the meantime, cut back on sweets, carbohydrates such as potatoes, pasta, rice, bread and increase your physical activity.  We will call you with your lab results.   DASH Eating Plan DASH stands for "Dietary Approaches to Stop Hypertension." The DASH eating plan is a healthy eating plan that has been shown to reduce high blood pressure (hypertension). It may also reduce your risk for type 2 diabetes, heart disease, and stroke. The DASH eating plan may also help with weight loss. What are tips for following this plan? General guidelines  Avoid eating more than 2,300 mg (milligrams) of salt (sodium) a day. If you have hypertension, you may need to reduce your sodium intake to 1,500 mg a day.  Limit alcohol intake to no more than 1 drink a day for nonpregnant women and 2 drinks a day for men. One drink equals 12 oz of beer, 5 oz of wine, or 1 oz of hard liquor.  Work with your health care provider to maintain a healthy body weight or to lose weight. Ask what an ideal weight is for you.  Get at least 30 minutes of exercise that causes your heart to beat faster (aerobic exercise) most days of the week. Activities may include walking, swimming, or biking.  Work with your health care provider or diet and nutrition specialist (dietitian) to adjust your eating plan  to your individual calorie needs. Reading food labels  Check food labels for the amount of sodium per serving. Choose foods with less than 5 percent of the Daily Value of sodium. Generally, foods with less than 300 mg of sodium per serving fit into this eating plan.  To find whole grains, look for the word "whole" as the first word in the ingredient list. Shopping  Buy products labeled as "low-sodium" or "no salt added."  Buy fresh foods. Avoid canned foods and premade or frozen meals. Cooking  Avoid adding salt when cooking. Use salt-free seasonings or herbs instead of table salt or sea salt. Check with your health care provider or pharmacist before using salt substitutes.  Do not fry foods. Cook foods using healthy methods such as baking, boiling, grilling, and broiling instead.  Cook with heart-healthy oils, such as olive, canola, soybean, or sunflower oil. Meal planning   Eat a balanced diet that includes: ? 5 or more servings of fruits and vegetables each day. At each meal, try to fill half of your plate with fruits and vegetables. ? Up to 6-8 servings of whole grains each day. ? Less than 6 oz of lean meat, poultry, or fish each day. A 3-oz serving of meat is about the same size as a deck of cards. One egg equals 1 oz. ? 2 servings of low-fat dairy each day. ? A serving of nuts, seeds, or beans 5 times each  week. ? Heart-healthy fats. Healthy fats called Omega-3 fatty acids are found in foods such as flaxseeds and coldwater fish, like sardines, salmon, and mackerel.  Limit how much you eat of the following: ? Canned or prepackaged foods. ? Food that is high in trans fat, such as fried foods. ? Food that is high in saturated fat, such as fatty meat. ? Sweets, desserts, sugary drinks, and other foods with added sugar. ? Full-fat dairy products.  Do not salt foods before eating.  Try to eat at least 2 vegetarian meals each week.  Eat more home-cooked food and less  restaurant, buffet, and fast food.  When eating at a restaurant, ask that your food be prepared with less salt or no salt, if possible. What foods are recommended? The items listed may not be a complete list. Talk with your dietitian about what dietary choices are best for you. Grains Whole-grain or whole-wheat bread. Whole-grain or whole-wheat pasta. Brown rice. [place]row. Bulgur. Whole-grain and low-sodium cereals. Pita bread. Low-fat, low-sodium crackers. Whole-wheat flour tortillas. Vegetables Fresh or frozen vegetables (raw, steamed, roasted, or grilled). Low-sodium or reduced-sodium tomato and vegetable juice. Low-sodium or reduced-sodium tomato sauce and tomato paste. Low-sodium or reduced-sodium canned vegetables. Fruits All fresh, dried, or frozen fruit. Canned fruit in natural juice (without added sugar). Meat and other protein foods Skinless chicken or [place]. Ground chicken or [place]. Pork with fat trimmed off. Fish and seafood. Egg whites. Dried beans, peas, or lentils. Unsalted nuts, nut butters, and seeds. Unsalted canned beans. Lean cuts of beef with fat trimmed off. Low-sodium, lean deli meat. Dairy Low-fat (1%) or fat-free (skim) milk. Fat-free, low-fat, or reduced-fat cheeses. Nonfat, low-sodium ricotta or cottage cheese. Low-fat or nonfat yogurt. Low-fat, low-sodium cheese. Fats and oils Soft margarine without trans fats. Vegetable oil. Low-fat, reduced-fat, or light mayonnaise and salad dressings (reduced-sodium). Canola, safflower, olive, soybean, and sunflower oils. Avocado. Seasoning and other foods Herbs. Spices. Seasoning mixes without salt. Unsalted popcorn and pretzels. Fat-free sweets. What foods are not recommended? The items listed may not be a complete list. Talk with your dietitian about what dietary choices are best for you. Grains Baked goods made with fat, such as croissants, muffins, or some breads. Dry pasta or rice meal packs. Vegetables Creamed or  fried vegetables. Vegetables in a cheese sauce. Regular canned vegetables (not low-sodium or reduced-sodium). Regular canned tomato sauce and paste (not low-sodium or reduced-sodium). Regular tomato and vegetable juice (not low-sodium or reduced-sodium). Angie Fava. Olives. Fruits Canned fruit in a light or heavy syrup. Fried fruit. Fruit in cream or butter sauce. Meat and other protein foods Fatty cuts of meat. Ribs. Fried meat. Berniece Salines. Sausage. Bologna and other processed lunch meats. Salami. Fatback. Hotdogs. Bratwurst. Salted nuts and seeds. Canned beans with added salt. Canned or smoked fish. Whole eggs or egg yolks. Chicken or [place] with skin. Dairy Whole or 2% milk, cream, and half-and-half. Whole or full-fat cream cheese. Whole-fat or sweetened yogurt. Full-fat cheese. Nondairy creamers. Whipped toppings. Processed cheese and cheese spreads. Fats and oils Butter. Stick margarine. Lard. Shortening. Ghee. Bacon fat. Tropical oils, such as coconut, palm kernel, or palm oil. Seasoning and other foods Salted popcorn and pretzels. Onion salt, garlic salt, seasoned salt, table salt, and sea salt. Worcestershire sauce. Tartar sauce. Barbecue sauce. Teriyaki sauce. Soy sauce, including reduced-sodium. Steak sauce. Canned and packaged gravies. Fish sauce. Oyster sauce. Cocktail sauce. Horseradish that you find on the shelf. Ketchup. Mustard. Meat flavorings and tenderizers. Bouillon cubes. Hot sauce and  Tabasco sauce. Premade or packaged marinades. Premade or packaged taco seasonings. Relishes. Regular salad dressings. Where to find more information:  National Heart, Lung, and [place]: https://wilson-eaton.com/  American Heart Association: www.heart.org Summary  The DASH eating plan is a healthy eating plan that has been shown to reduce high blood pressure (hypertension). It may also reduce your risk for type 2 diabetes, heart disease, and stroke.  With the DASH eating plan, you should limit salt  (sodium) intake to 2,300 mg a day. If you have hypertension, you may need to reduce your sodium intake to 1,500 mg a day.  When on the DASH eating plan, aim to eat more fresh fruits and vegetables, whole grains, lean proteins, low-fat dairy, and heart-healthy fats.  Work with your health care provider or diet and nutrition specialist (dietitian) to adjust your eating plan to your individual calorie needs. This information is not intended to replace advice given to you by your health care provider. Make sure you discuss any questions you have with your health care provider. Document Released: 09/06/2011 Document Revised: 09/10/2016 Document Reviewed: 09/10/2016 Elsevier Interactive Patient Education  [employer].

## 2018-09-16 NOTE — Progress Notes (Signed)
Subjective:    Patient ID: Kim Tanner, female    DOB: 08/13/1971, 47 y.o.   MRN: [id number]  HPI Chief Complaint  Patient presents with  . follow-up    follow-up on bp.    She is here for a 86-month follow-up on chronic health conditions.  Hypertension -previously well controlled on HCTZ.  Admits to poor compliance with medication and has not been taking it at all over the past few weeks.  States she is not checking her BP at home.   History of prediabetes.  States diet has been poor over the past few months.  Low TSH and history of thyroid nodule.  Nodule deemed to be benign.  She was referred to endocrinology but did not see them.  She has been on amlodipine in the past but this seemed to cause lower extremity edema.    States she is concerned about her weight. States she has tried exercise for weight loss.  She lost weight over the summer but gained it back.  States she has tried multiple diets in the past but is always gained her weight back.  Requests referral to dermatologist for multiple skin tags on face and neck.   Denies fever, chills, dizziness, headache, chest pain, palpitations, shortness of breath, abdominal pain, nausea, vomiting, diarrhea, lower extremity edema, urinary symptoms. Denies skin, hair or nail changes.  Reviewed allergies, medications, past medical, surgical, family, and social history.   Review of Systems Pertinent positives and negatives in the history of present illness.     Objective:   Physical Exam Constitutional:      General: She is not in acute distress.    Appearance: She is obese.  Eyes:     Conjunctiva/sclera: Conjunctivae normal.  Neck:     Musculoskeletal: Normal range of motion.  Pulmonary:     Effort: Pulmonary effort is normal.  Skin:    General: Skin is warm and dry.     Comments: Numerus skin tags to her face and neck  Neurological:     General: No focal deficit present.     Mental Status: She is alert and  oriented to person, place, and time. Mental status is at baseline.  Psychiatric:        Mood and Affect: Mood normal.        Thought Content: Thought content normal.    BP (!) 140/102   Pulse 74   Wt 263 lb 9.6 oz (119.6 kg)   SpO2 99%   BMI 48.60 kg/m       Assessment & Plan:  Uncontrolled hypertension - Plan: CBC with Differential/Platelet, Comprehensive metabolic panel, Amb Ref to Medical Weight Management  Prediabetes - Plan: Hemoglobin A1c, Amb Ref to Medical Weight Management  Morbid obesity ([place]) - Plan: CBC with Differential/Platelet, Comprehensive metabolic panel, TSH, T4, free, T3, Hemoglobin A1c, Amb Ref to Medical Weight Management  Low TSH level - Plan: TSH, T4, free, T3  Personal history of noncompliance with medical treatment, presenting hazards to health  History of thyroid nodule - Plan: TSH, T4, free, T3  Multiple acquired skin tags - Plan: Ambulatory referral to Dermatology  She is a 47 year old female who is here today for a 69-month follow-up. Reports she stopped taking blood pressure medication several weeks ago and has not been checking her blood pressure at home. Low TSH-she was referred to endocrinology but she did not follow-up with him. She did not have her mammogram as recommended History of noncompliance and we discussed that  poor compliance with medication and preventive health can lead to worsening health conditions.  States she is aware. Discussed in depth how uncontrolled hypertension can lead to heart failure, renal disease etc. She agrees to start back on HCTZ and start checking her blood pressure at home.  She will report back in 4 weeks Recheck thyroid panel.  Discussed that severely low TSH should be evaluated by endocrinology. Referral to [facility] health weight management for morbid obesity. Recheck hemoglobin A1c for prediabetes In-depth counseling done on healthy diet and lifestyle Referral to dermatology per patient request for numerous  skin tags on face and neck Follow-up in 4 weeks or pending labs

## 2018-09-17 LAB — CBC WITH DIFFERENTIAL/PLATELET
Basophils Absolute: 0 10*3/uL (ref 0.0–0.2)
Basos: 1 %
EOS (ABSOLUTE): 0.1 10*3/uL (ref 0.0–0.4)
Eos: 2 %
Hematocrit: 34.8 % (ref 34.0–46.6)
Hemoglobin: 11.9 g/dL (ref 11.1–15.9)
Immature Grans (Abs): 0 10*3/uL (ref 0.0–0.1)
Immature Granulocytes: 0 %
Lymphocytes Absolute: 2.5 10*3/uL (ref 0.7–3.1)
Lymphs: 37 %
MCH: 28.5 pg (ref 26.6–33.0)
MCHC: 34.2 g/dL (ref 31.5–35.7)
MCV: 83 fL (ref 79–97)
Monocytes Absolute: 0.4 10*3/uL (ref 0.1–0.9)
Monocytes: 6 %
Neutrophils Absolute: 3.6 10*3/uL (ref 1.4–7.0)
Neutrophils: 54 %
Platelets: 242 10*3/uL (ref 150–450)
RBC: 4.18 x10E6/uL (ref 3.77–5.28)
RDW: 13.7 % (ref 12.3–15.4)
WBC: 6.7 10*3/uL (ref 3.4–10.8)

## 2018-09-17 LAB — COMPREHENSIVE METABOLIC PANEL
ALT: 21 IU/L (ref 0–32)
AST: 16 IU/L (ref 0–40)
Albumin/Globulin Ratio: 1.5 (ref 1.2–2.2)
Albumin: 3.9 g/dL (ref 3.5–5.5)
Alkaline Phosphatase: 106 IU/L (ref 39–117)
BUN/Creatinine Ratio: 11 (ref 9–23)
BUN: 9 mg/dL (ref 6–24)
Bilirubin Total: <0.2 mg/dL (ref 0.0–1.2)
CO2: 26 mmol/L (ref 20–29)
Calcium: 9.2 mg/dL (ref 8.7–10.2)
Chloride: 102 mmol/L (ref 96–106)
Creatinine, Ser: 0.83 mg/dL (ref 0.57–1.00)
GFR calc Af Amer: 98 mL/min/{1.73_m2} (ref >59)
GFR calc non Af Amer: 85 mL/min/{1.73_m2} (ref >59)
Globulin, Total: 2.6 g/dL (ref 1.5–4.5)
Glucose: 111 mg/dL — ABNORMAL HIGH (ref 65–99)
Potassium: 3.9 mmol/L (ref 3.5–5.2)
Sodium: 140 mmol/L (ref 134–144)
Total Protein: 6.5 g/dL (ref 6.0–8.5)

## 2018-09-17 LAB — T3: T3, Total: 182 ng/dL — ABNORMAL HIGH (ref 71–180)

## 2018-09-17 LAB — HEMOGLOBIN A1C
Est. average glucose Bld gHb Est-mCnc: 120 mg/dL
Hgb A1c MFr Bld: 5.8 % — ABNORMAL HIGH (ref 4.8–5.6)

## 2018-09-17 LAB — TSH: TSH: <0.006 u[IU]/mL — ABNORMAL LOW (ref 0.450–4.500)

## 2018-09-17 LAB — T4, FREE: Free T4: 1.69 ng/dL (ref 0.82–1.77)

## 2018-10-13 ENCOUNTER — Ambulatory Visit (INDEPENDENT_AMBULATORY_CARE_PROVIDER_SITE_OTHER): Payer: BC Managed Care – PPO | Admitting: Family Medicine

## 2018-10-13 ENCOUNTER — Encounter: Payer: Self-pay | Admitting: Family Medicine

## 2018-10-13 VITALS — BP 130/86 | HR 86 | Temp 98.3°F | Ht 62.0 in | Wt 263.0 lb

## 2018-10-13 DIAGNOSIS — R7989 Other specified abnormal findings of blood chemistry: Secondary | ICD-10-CM

## 2018-10-13 DIAGNOSIS — J4520 Mild intermittent asthma, uncomplicated: Secondary | ICD-10-CM

## 2018-10-13 DIAGNOSIS — I10 Essential (primary) hypertension: Secondary | ICD-10-CM | POA: Diagnosis not present

## 2018-10-13 DIAGNOSIS — E041 Nontoxic single thyroid nodule: Secondary | ICD-10-CM | POA: Diagnosis not present

## 2018-10-13 DIAGNOSIS — R0982 Postnasal drip: Secondary | ICD-10-CM

## 2018-10-13 NOTE — Patient Instructions (Addendum)
Your blood pressure today is close to goal range at 130/86.  Continue on the current HCTZ dose and make sure you are eating a diet low in sodium.  For the postnasal drainage I recommend that you try taking an over-the-counter nondrowsy antihistamine such as Xyzal, Allegra, Claritin or Zyrtec. You can try Mucinex for congestion or cough.  Salt water gargles for throat irritation.   If you continue needing the albuterol inhaler then I recommend that you start back on the daily maintenance medication, Advair.  As discussed, I will put in a new referral to the endocrinologist.  Follow-up in 6 months or sooner if your blood pressure is not under good control.  Goal blood pressure is less than 130/80.

## 2018-10-13 NOTE — Progress Notes (Signed)
   Subjective:    Patient ID: Kim Tanner, female    DOB: 1971/07/12, 48 y.o.   MRN: [id number]  HPI Chief Complaint  Patient presents with  . Hypertension    follow up   . Hoarse    lost voice, using inhaler more often    She is here to follow on HTN. Reports taking HCTZ once daily.  No side effects.  Does not check her blood pressure at home.   Complains of a 4 week history of hoarse voice that was preceded by a cold per patient.  Using albuterol inhaler twice daily for the past week and half. She is not using her daily Advair. Stopped this months ago. Denies fever, chills, dizziness, chest pain, palpitations, shortness of breath, wheezing, abdominal pain, N/V/D, urinary symptoms.   States she attempted to call and schedule with her endocrinologist but she was told that she would need a new referral in order to be seen.  She is being seen for low TSH.  She requests a new referral today.  Reviewed allergies, medications, past medical, surgical, family, and social history.    Review of Systems Pertinent positives and negatives in the history of present illness.     Objective:   Physical Exam BP 130/86   Pulse 86   Temp 98.3 F (36.8 C) (Oral)   Ht 5\' 2"  (1.575 m)   Wt 263 lb (119.3 kg)   SpO2 96%   BMI 48.10 kg/m   Alert and in no distress. Tympanic membranes and canals are normal. Pharyngeal area is mildly erythematous, no edema or exudate.. Neck is supple without adenopathy. Cardiac exam shows a regular rhythm without murmurs or gallops. Lungs are clear to auscultation.  Extremities without edema.  Skin is warm and dry.     Assessment & Plan:  Essential hypertension  Mild intermittent asthma without complication  Low TSH level - Plan: Ambulatory referral to Endocrinology  Right thyroid nodule - Plan: Ambulatory referral to Endocrinology  Post-nasal drainage  Discussed that her blood pressure is closer to goal range.  We will continue on current  medication.  Counseling was done on eating a healthy diet low in sodium and getting more physical activity.  I also encouraged her to get a blood pressure cuff and start checking her blood pressure at home. Recommend that she try a nondrowsy over-the-counter antihistamine for postnasal drainage and try salt water gargles for throat irritation and hoarseness.  If she is still needing her albuterol inhaler then I recommend she start back on her daily Advair. A new referral to endocrinology was done. She will follow-up in 6 months or sooner if her blood pressure is not in goal range consistently.

## 2018-10-20 ENCOUNTER — Encounter: Payer: Self-pay | Admitting: Internal Medicine

## 2018-11-04 ENCOUNTER — Encounter (INDEPENDENT_AMBULATORY_CARE_PROVIDER_SITE_OTHER): Payer: BC Managed Care – PPO

## 2018-11-17 ENCOUNTER — Ambulatory Visit: Payer: PRIVATE HEALTH INSURANCE | Admitting: Internal Medicine

## 2018-11-18 ENCOUNTER — Ambulatory Visit (INDEPENDENT_AMBULATORY_CARE_PROVIDER_SITE_OTHER): Payer: BC Managed Care – PPO | Admitting: Family Medicine

## 2018-11-18 ENCOUNTER — Encounter (INDEPENDENT_AMBULATORY_CARE_PROVIDER_SITE_OTHER): Payer: Self-pay | Admitting: Family Medicine

## 2018-11-18 VITALS — BP 134/82 | HR 72 | Temp 98.2°F | Ht 61.0 in | Wt 259.0 lb

## 2018-11-18 DIAGNOSIS — R5383 Other fatigue: Secondary | ICD-10-CM

## 2018-11-18 DIAGNOSIS — Z0289 Encounter for other administrative examinations: Secondary | ICD-10-CM

## 2018-11-18 DIAGNOSIS — I10 Essential (primary) hypertension: Secondary | ICD-10-CM | POA: Diagnosis not present

## 2018-11-18 DIAGNOSIS — J4530 Mild persistent asthma, uncomplicated: Secondary | ICD-10-CM | POA: Diagnosis not present

## 2018-11-18 DIAGNOSIS — E7800 Pure hypercholesterolemia, unspecified: Secondary | ICD-10-CM

## 2018-11-18 DIAGNOSIS — Z9189 Other specified personal risk factors, not elsewhere classified: Secondary | ICD-10-CM

## 2018-11-18 DIAGNOSIS — R011 Cardiac murmur, unspecified: Secondary | ICD-10-CM

## 2018-11-18 DIAGNOSIS — Z6841 Body Mass Index (BMI) 40.0 and over, adult: Secondary | ICD-10-CM

## 2018-11-18 DIAGNOSIS — R739 Hyperglycemia, unspecified: Secondary | ICD-10-CM | POA: Diagnosis not present

## 2018-11-18 DIAGNOSIS — Z1331 Encounter for screening for depression: Secondary | ICD-10-CM

## 2018-11-18 NOTE — Progress Notes (Signed)
Office: [phone]  /  Fax: [phone]   Dear Kim Dingwall, NP-C,   Thank you for referring Kim Tanner to our clinic. The following note includes my evaluation and treatment recommendations.  HPI:   Chief Complaint: OBESITY    Kim Tanner has been referred by Kim Dingwall, NP-C for consultation regarding *HIS* obesity and obesity related comorbidities.    Kim Tanner (MR# [id number]) is a 48 y.o. female who presents on 11/18/2018 for obesity evaluation and treatment. Current BMI is Body mass index is 48.94 kg/m.Kim Tanner has been struggling with her weight for many years and has been unsuccessful in either losing weight, maintaining weight loss, or reaching her healthy weight goal.     Kim Tanner attended our information session and states she is currently in the action stage of change and ready to dedicate time achieving and maintaining a healthier weight. Kim Tanner is interested in becoming our patient and working on intensive lifestyle modifications including (but not limited to) diet, exercise and weight loss.    Kim Tanner states her family eats meals together she thinks her family will eat healthier with her her desired weight loss is 90 lbs she has been heavy most of  her life she started gaining weight after high school her heaviest weight ever was 260 lbs. she is a picky eater and doesn't like to eat healthier foods  she has significant food cravings issues in the early afternoons she snacks frequently in the evenings she wakes up sometimes in the middle of the night to eat  she skips lunch 3 times per week she is frequently drinking regular soda once or twice daily she frequently makes poor food choices she has problems with excessive hunger  she frequently eats larger portions than normal  she struggles with emotional eating    Fatigue Kim Tanner feels her energy is lower than it should be. This has worsened with weight gain and has worsened recently. Kim Tanner  admits to daytime somnolence and  admits to waking up still tired. Patient is at risk for obstructive sleep apnea. Patent has a history of symptoms of daytime fatigue, morning fatigue and morning headache. Patient generally gets 5 hours of sleep per night, and states they generally do not have restful sleep. Snoring is present. Apneic episodes are present. Epworth Sleepiness Score is 12.   Kim Tanner notes increasing shortness of breath with exercising and seems to be worsening over time with weight gain. She notes getting out of breath sooner with activity than she used to. The patient does not report if this has gotten worse recently. Kim Tanner denies orthopnea.  Hyperglycemia Kim Tanner has a history of some elevated blood glucose readings without a diagnosis of diabetes and is not on metformin. She admits to polyphagia.  Hypertension Kim Tanner is a 48 y.o. female with hypertension.  Kim Tanner denies chest pain or shortness of breath on exertion. She is working weight loss to help control her blood pressure with the goal of decreasing her risk of heart attack and stroke. Kim Tanner's blood pressure is currently stable on medications. She would like to diet control.  Hyperlipidemia Kim Tanner has hyperlipidemia with elevated LDL in the past. She is not on a statin and has been trying to improve her cholesterol levels with intensive lifestyle modification including a low saturated fat diet, exercise and weight loss. She denies any chest pain, claudication or myalgias.  Mild Persistent Asthma Kim Tanner is on Advair daily and Albuterol prn.  New Onset Heart Murmur Kim Tanner reports new  onset of heart murmur with shortness of air with exercise and lower extremity edema.  Depression Screen Kim Tanner (modified PHQ-9) score was  Depression screen PHQ 2/9 11/18/2018  Decreased Interest 1  Down, Depressed, Hopeless 1  PHQ - 2 Score 2  Altered sleeping 2  Tired,  decreased energy 3  Change in appetite 2  Feeling bad or failure about yourself  0  Trouble concentrating 1  Moving slowly or fidgety/restless 1  Suicidal thoughts 0  PHQ-9 Score 11  Difficult doing work/chores Somewhat difficult   ASSESSMENT AND PLAN:  Other fatigue - Plan: EKG 12-Lead, Vitamin B12, CBC With Differential, Folate, T3, T4, free, TSH, VITAMIN D 25 Hydroxy (Vit-D Deficiency, Fractures)  Hyperglycemia - Plan: Comprehensive metabolic panel, Hemoglobin A1c, Insulin, random  Essential hypertension  Pure hypercholesterolemia - Plan: Lipid Panel With LDL/HDL Ratio  Mild persistent asthma without complication  Newly recognized heart murmur  Depression screening  At risk for heart disease  Class 3 severe obesity with serious comorbidity and body mass index (BMI) of 45.0 to 49.9 in adult, unspecified obesity type (HCC)  PLAN:  Fatigue Kim Tanner was informed that her fatigue may be related to obesity, depression or many other causes. Labs will be ordered, and in the meanwhile Kim Tanner has agreed to work on diet, exercise and weight loss to help with fatigue. Proper sleep hygiene was discussed including the need for 7-8 hours of quality sleep each night. A sleep study was not ordered based on symptoms and Epworth score.  Kim on exertion Kim Tanner's shortness of breath appears to be obesity related and exercise induced. She has agreed to work on weight loss and gradually increase exercise to treat her exercise induced shortness of breath. If Kim Tanner follows our instructions and loses weight without improvement of her shortness of breath, we will plan to refer to pulmonology. We will monitor this condition regularly. Kim Tanner agrees to this plan.  Hyperglycemia Fasting labs will be obtained and results with be discussed with Kim Tanner in 2 weeks at her follow-up visit. In the meanwhile Kim Tanner was started on a lower simple carbohydrate diet and will work on weight loss  efforts.  Hypertension We discussed sodium restriction, working on healthy weight loss, and a regular exercise program as the means to achieve improved blood pressure control. Kim Tanner agreed with this plan and agreed to follow-up as directed. We will continue to monitor her blood pressure as well as her progress with the above lifestyle modifications. She will continue her medications as prescribed and will watch for signs of hypotension as she continues her lifestyle modifications. We will check fasting labs today.  Hyperlipidemia Kim Tanner was informed of the American Heart Association Guidelines emphasizing intensive lifestyle modifications as the first line treatment for hyperlipidemia. We discussed many lifestyle modifications today in depth, and Kim Tanner will continue to work on decreasing saturated fats such as fatty red meat, butter and many fried foods. She will also increase vegetables and lean protein in her diet and continue to work on exercise and weight loss efforts. We will check fasting labs today.  Mild Persistent Asthma We will check IC and patient will work on weight loss.  New Onset Heart Murmur An echocardiogram will be ordered for Latreece (Authorization #[id number] 02/18 - 03/18) and the office will contact her with an appointment. Zykiria will follow-up with our clinic at the agreed upon time.   Depression Screen Kylene had a moderately positive depression screening. Depression is commonly associated with obesity and often results  in emotional eating behaviors. We will monitor this closely and work on CBT to help improve the non-hunger eating patterns. Referral to Psychology may be required if no improvement is seen as she continues in our clinic.  Obesity Ethelyn is currently in the action stage of change and her goal is to continue with weight loss efforts. I recommend Tifini begin the structured treatment plan as follows:  She has agreed to follow the Category 2  plan. Venora has been instructed to eventually work up to a goal of 150 minutes of combined cardio and strengthening exercise per week for weight loss and overall health benefits. We discussed the following Behavioral Modification Strategies today: increasing lean protein intake, decreasing simple carbohydrates, work on meal planning and easy cooking plans, and dealing with family or coworker sabotage.   She was informed of the importance of frequent follow-up visits to maximize her success with intensive lifestyle modifications for her multiple health conditions. She was informed we would discuss her lab results at her next visit unless there is a critical issue that needs to be addressed sooner. Sharell agreed to keep her next visit at the agreed upon time to discuss these results.  ALLERGIES: Allergies  Allergen Reactions  . Compazine [Prochlorperazine Edisylate]     MEDICATIONS: Current Outpatient Medications on File Prior to Visit  Medication Sig Dispense Refill  . albuterol (PROVENTIL HFA;VENTOLIN HFA) 108 (90 Base) MCG/ACT inhaler Inhale 2 puffs into the lungs every 6 (six) hours as needed for wheezing or shortness of breath. 1 Inhaler 1  . Fluticasone-Salmeterol (ADVAIR DISKUS) 100-50 MCG/DOSE AEPB Inhale 1 puff into the lungs 2 (two) times daily. 60 each 1  . hydrochlorothiazide (HYDRODIURIL) 25 MG tablet Take 1 tablet (25 mg total) by mouth daily. 90 tablet 1  . meclizine (ANTIVERT) 25 MG tablet TAKE 1 TABLET(25 MG) BY MOUTH TWICE DAILY AS NEEDED FOR DIZZINESS 30 tablet 0   No current facility-administered medications on file prior to visit.     PAST MEDICAL HISTORY: Past Medical History:  Diagnosis Date  . Anxiety   . Asthma   . Back pain   . Kim   . HTN (hypertension)   . Joint pain   . Lower extremity edema   . Metabolic syndrome   . Morbid obesity ([place])   . Prediabetes     PAST SURGICAL HISTORY: Past Surgical History:  Procedure Laterality Date  . BREAST  REDUCTION SURGERY  2008  . CESAREAN SECTION    . DILATION AND CURETTAGE OF UTERUS    . INDUCED ABORTION    . uterine ablation      SOCIAL HISTORY: Social History   Tobacco Use  . Smoking status: Never Smoker  . Smokeless tobacco: Never Used  Substance Use Topics  . Alcohol use: Yes    Comment: socially   . Drug use: No    FAMILY HISTORY: Family History  Problem Relation Age of Onset  . Hypertension Mother   . Obesity Mother   . Hypertension Father   . Diabetes Father   . Diabetes Sister   . Stroke Sister 87   ROS: Review of Systems  Constitutional: Positive for malaise/fatigue.  HENT:       Positive for mild thyromegaly.  Cardiovascular: Negative for orthopnea.       Positive for new onset heart murmur.  Endo/Heme/Allergies:       Positive for polyphagia.   PHYSICAL EXAM: Blood pressure 134/82, pulse 72, temperature 98.2 F (36.8 C), temperature source  Oral, height 5\' 1"  (1.549 m), weight 259 lb (117.5 kg), last menstrual period 09/20/2018, SpO2 99 %. Body mass index is 48.94 kg/m. Physical Exam Vitals signs reviewed.  Constitutional:      Appearance: Normal appearance. She is well-developed. She is obese.  HENT:     Head: Normocephalic and atraumatic.     Nose: Nose normal.  Eyes:     General: No scleral icterus. Neck:     Musculoskeletal: Normal range of motion.     Thyroid: Thyromegaly ( Mild) present.  Cardiovascular:     Rate and Rhythm: Normal rate and regular rhythm.     Heart sounds: Murmur present. Systolic ( early) murmur present with a grade of 2/6.  Pulmonary:     Effort: Pulmonary effort is normal. No respiratory distress.  Abdominal:     Palpations: Abdomen is soft.     Tenderness: There is no abdominal tenderness.  Musculoskeletal: Normal range of motion.     Right lower leg: Edema (Trace) present.     Left lower leg: Edema (Trace) present.     Comments: Range of motion normal in all four extremities.  Skin:    General: Skin is  warm and dry.  Neurological:     Mental Status: She is alert and oriented to person, place, and time.     Coordination: Coordination normal.  Psychiatric:        Tanner and Affect: Tanner and affect normal.        Behavior: Behavior normal.   RECENT LABS AND TESTS: BMET    Component Value Date/Time   NA 140 09/16/2018 1650   K 3.9 09/16/2018 1650   CL 102 09/16/2018 1650   CO2 26 09/16/2018 1650   GLUCOSE 111 (H) 09/16/2018 1650   GLUCOSE 114 (H) 08/21/2017 1009   BUN 9 09/16/2018 1650   CREATININE 0.83 09/16/2018 1650   CREATININE 0.77 08/21/2017 1009   CALCIUM 9.2 09/16/2018 1650   GFRNONAA 85 09/16/2018 1650   GFRNONAA 94 08/21/2017 1009   GFRAA 98 09/16/2018 1650   GFRAA 109 08/21/2017 1009   Lab Results  Component Value Date   HGBA1C 5.8 (H) 09/16/2018   No results found for: INSULIN CBC    Component Value Date/Time   WBC 6.7 09/16/2018 1650   WBC 6.4 08/21/2017 1009   RBC 4.18 09/16/2018 1650   RBC 4.23 08/21/2017 1009   HGB 11.9 09/16/2018 1650   HCT 34.8 09/16/2018 1650   PLT 242 09/16/2018 1650   MCV 83 09/16/2018 1650   MCH 28.5 09/16/2018 1650   MCH 27.9 08/21/2017 1009   MCHC 34.2 09/16/2018 1650   MCHC 33.9 08/21/2017 1009   RDW 13.7 09/16/2018 1650   LYMPHSABS 2.5 09/16/2018 1650   MONOABS 588 12/26/2016 1525   EOSABS 0.1 09/16/2018 1650   BASOSABS 0.0 09/16/2018 1650   Iron/TIBC/Ferritin/ %Sat No results found for: IRON, TIBC, FERRITIN, IRONPCTSAT Lipid Panel     Component Value Date/Time   CHOL 175 02/25/2018 0934   TRIG 104 02/25/2018 0934   HDL 41 02/25/2018 0934   CHOLHDL 4.3 02/25/2018 0934   CHOLHDL 3.6 01/10/2017 0823   VLDL 12 01/10/2017 0823   LDLCALC 113 (H) 02/25/2018 0934   Hepatic Function Panel     Component Value Date/Time   PROT 6.5 09/16/2018 1650   ALBUMIN 3.9 09/16/2018 1650   AST 16 09/16/2018 1650   ALT 21 09/16/2018 1650   ALKPHOS 106 09/16/2018 1650   BILITOT <0.2 09/16/2018 1650  Component Value  Date/Time   TSH <0.006 (L) 09/16/2018 1650   TSH <0.006 (L) 02/25/2018 0934   TSH 0.01 (L) 08/21/2017 1009   ECG  shows NSR with a rate of 86 BPM. INDIRECT CALORIMETER done today shows a VO2 of 232 and a REE of 1617.  Her calculated basal metabolic rate is 7829 thus her basal metabolic rate is worse than expected.  OBESITY BEHAVIORAL INTERVENTION VISIT  Today's visit was #1  Starting weight: 259 lbs Starting date: 11/18/2018 Today's weight: 259 lbs Today's date: 11/18/2018 Total lbs lost to date: 0    Most Recent Value 11/22/2013 - 11/21/2018  Height 5\' 1"  (1.549 m) 11/18/2018  Weight 259 lb (117.5 kg) 11/18/2018  BMI (Calculated) 48.96 11/18/2018  BLOOD PRESSURE - SYSTOLIC 562 10/31/8655  BLOOD PRESSURE - DIASTOLIC 82 8/46/9629  Waist Measurement  48 inches 11/18/2018   Body Fat % 55.4 % 11/18/2018  RMR 1617 11/18/2018   ASK: We discussed the diagnosis of obesity with Marquette Saa today and Arliene agreed to give [name] permission to discuss obesity behavioral modification therapy today.  ASSESS: Klaire has the diagnosis of obesity and her BMI today is 48.94. Sacoya is in the action stage of change.   ADVISE: Kemisha was educated on the multiple health risks of obesity as well as the benefit of weight loss to improve her health. She was advised of the need for long term treatment and the importance of lifestyle modifications to improve her current health and to decrease her risk of future health problems.  AGREE: Multiple dietary modification options and treatment options were discussed and  Necha agreed to follow the recommendations documented in the above note.  ARRANGE: Ayza was educated on the importance of frequent visits to treat obesity as outlined per CMS and USPSTF guidelines and agreed to schedule her next follow up appointment today.  I, Michaelene Song, am acting as Location manager for Dennard Nip, MD  I have reviewed the above documentation for  accuracy and completeness, and I agree with the above. -Dennard Nip, MD

## 2018-11-19 LAB — CBC WITH DIFFERENTIAL
Basophils Absolute: 0 10*3/uL (ref 0.0–0.2)
Basos: 1 %
EOS (ABSOLUTE): 0.1 10*3/uL (ref 0.0–0.4)
Eos: 2 %
Hematocrit: 33.5 % — ABNORMAL LOW (ref 34.0–46.6)
Hemoglobin: 11.6 g/dL (ref 11.1–15.9)
Immature Grans (Abs): 0 10*3/uL (ref 0.0–0.1)
Immature Granulocytes: 0 %
Lymphocytes Absolute: 2.1 10*3/uL (ref 0.7–3.1)
Lymphs: 34 %
MCH: 28.1 pg (ref 26.6–33.0)
MCHC: 34.6 g/dL (ref 31.5–35.7)
MCV: 81 fL (ref 79–97)
Monocytes Absolute: 0.4 10*3/uL (ref 0.1–0.9)
Monocytes: 7 %
Neutrophils Absolute: 3.5 10*3/uL (ref 1.4–7.0)
Neutrophils: 56 %
RBC: 4.13 x10E6/uL (ref 3.77–5.28)
RDW: 13.8 % (ref 11.7–15.4)
WBC: 6.2 10*3/uL (ref 3.4–10.8)

## 2018-11-19 LAB — LIPID PANEL WITH LDL/HDL RATIO
Cholesterol, Total: 155 mg/dL (ref 100–199)
HDL: 43 mg/dL (ref >39)
LDL Calculated: 94 mg/dL (ref 0–99)
LDl/HDL Ratio: 2.2 ratio (ref 0.0–3.2)
Triglycerides: 88 mg/dL (ref 0–149)
VLDL Cholesterol Cal: 18 mg/dL (ref 5–40)

## 2018-11-19 LAB — T4, FREE: Free T4: 2.14 ng/dL — ABNORMAL HIGH (ref 0.82–1.77)

## 2018-11-19 LAB — COMPREHENSIVE METABOLIC PANEL
ALT: 19 IU/L (ref 0–32)
AST: 21 IU/L (ref 0–40)
Albumin/Globulin Ratio: 1.7 (ref 1.2–2.2)
Albumin: 3.8 g/dL (ref 3.8–4.8)
Alkaline Phosphatase: 90 IU/L (ref 39–117)
BUN/Creatinine Ratio: 11 (ref 9–23)
BUN: 8 mg/dL (ref 6–24)
Bilirubin Total: 0.3 mg/dL (ref 0.0–1.2)
CO2: 22 mmol/L (ref 20–29)
Calcium: 9.1 mg/dL (ref 8.7–10.2)
Chloride: 106 mmol/L (ref 96–106)
Creatinine, Ser: 0.73 mg/dL (ref 0.57–1.00)
GFR calc Af Amer: 115 mL/min/{1.73_m2} (ref >59)
GFR calc non Af Amer: 100 mL/min/{1.73_m2} (ref >59)
Globulin, Total: 2.3 g/dL (ref 1.5–4.5)
Glucose: 87 mg/dL (ref 65–99)
Potassium: 4 mmol/L (ref 3.5–5.2)
Sodium: 142 mmol/L (ref 134–144)
Total Protein: 6.1 g/dL (ref 6.0–8.5)

## 2018-11-19 LAB — T3: T3, Total: 192 ng/dL — ABNORMAL HIGH (ref 71–180)

## 2018-11-19 LAB — FOLATE: Folate: 9.7 ng/mL (ref >3.0)

## 2018-11-19 LAB — INSULIN, RANDOM: INSULIN: 17.7 u[IU]/mL (ref 2.6–24.9)

## 2018-11-19 LAB — HEMOGLOBIN A1C
Est. average glucose Bld gHb Est-mCnc: 111 mg/dL
Hgb A1c MFr Bld: 5.5 % (ref 4.8–5.6)

## 2018-11-19 LAB — VITAMIN D 25 HYDROXY (VIT D DEFICIENCY, FRACTURES): Vit D, 25-Hydroxy: 7.6 ng/mL — ABNORMAL LOW (ref 30.0–100.0)

## 2018-11-19 LAB — VITAMIN B12: Vitamin B-12: 422 pg/mL (ref 232–1245)

## 2018-11-19 LAB — TSH: TSH: <0.006 u[IU]/mL — ABNORMAL LOW (ref 0.450–4.500)

## 2018-11-24 ENCOUNTER — Other Ambulatory Visit (HOSPITAL_COMMUNITY): Payer: PRIVATE HEALTH INSURANCE

## 2018-12-02 ENCOUNTER — Ambulatory Visit (INDEPENDENT_AMBULATORY_CARE_PROVIDER_SITE_OTHER): Payer: BC Managed Care – PPO | Admitting: Family Medicine

## 2018-12-02 ENCOUNTER — Encounter (INDEPENDENT_AMBULATORY_CARE_PROVIDER_SITE_OTHER): Payer: Self-pay | Admitting: Family Medicine

## 2018-12-02 VITALS — BP 135/62 | HR 81 | Ht 61.0 in | Wt 253.0 lb

## 2018-12-02 DIAGNOSIS — Z9189 Other specified personal risk factors, not elsewhere classified: Secondary | ICD-10-CM | POA: Diagnosis not present

## 2018-12-02 DIAGNOSIS — E559 Vitamin D deficiency, unspecified: Secondary | ICD-10-CM

## 2018-12-02 DIAGNOSIS — E0590 Thyrotoxicosis, unspecified without thyrotoxic crisis or storm: Secondary | ICD-10-CM

## 2018-12-02 DIAGNOSIS — Z6841 Body Mass Index (BMI) 40.0 and over, adult: Secondary | ICD-10-CM

## 2018-12-02 DIAGNOSIS — R011 Cardiac murmur, unspecified: Secondary | ICD-10-CM | POA: Diagnosis not present

## 2018-12-02 DIAGNOSIS — R7303 Prediabetes: Secondary | ICD-10-CM

## 2018-12-02 MED ORDER — VITAMIN D (ERGOCALCIFEROL) 1.25 MG (50000 UNIT) PO CAPS: 50000.0000 [IU] | ORAL_CAPSULE | ORAL | 0 refills | Status: DC

## 2018-12-02 MED ORDER — METFORMIN HCL 500 MG PO TABS: 500.0000 mg | ORAL_TABLET | Freq: Every day | ORAL | 0 refills | Status: DC

## 2018-12-02 NOTE — Progress Notes (Signed)
Office: [phone]  /  Fax: [phone]   HPI:   Chief Complaint: OBESITY Kim Tanner is here to discuss her progress with her obesity treatment plan. She is on the Category 2 plan and is following her eating plan approximately 70 % of the time. She states she is walking for 20 minutes 3 times per week. [name] did very well with weight loss on her Category 2 plan. Her hunger was controlled most of the time, but she did note morning polyphagia. Her family tolerated her meal plan well and meal prepping wasn't too difficult.  Her weight is 253 lb (114.8 kg) today and has had a weight loss of 6 pounds over a period of 2 weeks since her last visit. She has lost 6 lbs since starting treatment with [name].  Vitamin D Deficiency Kim Tanner has a diagnosis of vitamin D deficiency. She is not currently taking Vit D. She notes fatigue and denies nausea, vomiting or muscle weakness.  Hyperthyroidism Kim Tanner has a diagnosis of hypothyroidism. She has had 3 low TSH in Epic. She states she saw an Endocrinologist and had a ultrasound, and was told it wasn't cancer but she didn't follow up. She is not on medicine. She denies palpitations or exophthalmos.  Pre-Diabetes Kim Tanner has a diagnosis of pre-diabetes based on her elevated Hgb A1c and was informed this puts her at greater risk of developing diabetes. She is not taking metformin currently and notes polyphagia even on her Category 2 plan. She continues to work on diet and exercise to decrease risk of diabetes. She denies hypoglycemia.  At risk for diabetes Kim Tanner is at higher than average risk for developing diabetes due to her obesity and pre-diabetes. She currently denies polyuria or polydipsia.  New Onset Heart Murmur Kim Tanner has a new onset heart murmur with hyperthyroid. She is scheduled for and echocardiogram tomorrow. She denies palpitations.  ASSESSMENT AND PLAN:  Vitamin D deficiency - Plan: Vitamin D, Ergocalciferol, (DRISDOL) 1.25 MG (50000  UT) CAPS capsule  Hyperthyroidism  Prediabetes - Plan: metFORMIN (GLUCOPHAGE) 500 MG tablet  Newly recognized heart murmur  At risk for diabetes mellitus  Class 3 severe obesity with serious comorbidity and body mass index (BMI) of 45.0 to 49.9 in adult, unspecified obesity type ([place])  PLAN:  Vitamin D Deficiency Kim Tanner was informed that low vitamin D levels contributes to fatigue and are associated with obesity, breast, and colon cancer. Kim Tanner agrees to start prescription Vit D @50 ,000 IU every week #4 with no refills. She will follow up for routine testing of vitamin D, at least 2-3 times per year. She was informed of the risk of over-replacement of vitamin D and agrees to not increase her dose unless she discusses this with [name] first. Kim Tanner agrees to follow up with our clinic in 2 to 3 weeks.  Hyperthyroidism Kim Tanner was informed that supertheraputic thyroid levels are dangerous and will not improve weight loss results. Kim Tanner was advised to schedule an appointment with her Endocrinologist today, as this is dangerous and could cause heart problems and even death. She agreed to schedule today. Dalanie agrees to follow up with our clinic in 2 to 3 weeks.  Pre-Diabetes Kim Tanner will continue to work on weight loss, diet, exercise, and decreasing simple carbohydrates in her diet to help decrease the risk of diabetes. We dicussed metformin including benefits and risks. She was informed that eating too many simple carbohydrates or too many calories at one sitting increases the likelihood of GI side effects. Kim Tanner agrees to start  metformin 500 mg q AM #30 with no refills. [name] agrees to follow up with our clinic in 2 to 3 weeks as directed to monitor her progress.  Diabetes risk counseling Kim Tanner was given extended (30 minutes) diabetes prevention counseling today. She is 48 y.o. female and has risk factors for diabetes including obesity and pre-diabetes. We discussed intensive  lifestyle modifications today with an emphasis on weight loss as well as increasing exercise and decreasing simple carbohydrates in her diet.  New Onset Heart Murmur Kim Tanner was strongly encouraged to get an echocardiogram and we will discuss results at follow up visit in 2 to 3 weeks.  Obesity Kim Tanner is currently in the action stage of change. As such, her goal is to continue with weight loss efforts She has agreed to follow the Category 2 plan Kim Tanner has been instructed to work up to a goal of 150 minutes of combined cardio and strengthening exercise per week for weight loss and overall health benefits. We discussed the following Behavioral Modification Strategies today: increasing lean protein intake, decreasing simple carbohydrates, increasing vegetables, and better snacking choices   Kim Tanner has agreed to follow up with our clinic in 2 to 3 weeks. She was informed of the importance of frequent follow up visits to maximize her success with intensive lifestyle modifications for her multiple health conditions.  ALLERGIES: Allergies  Allergen Reactions  . Compazine [Prochlorperazine Edisylate]     MEDICATIONS: Current Outpatient Medications on File Prior to Visit  Medication Sig Dispense Refill  . albuterol (PROVENTIL HFA;VENTOLIN HFA) 108 (90 Base) MCG/ACT inhaler Inhale 2 puffs into the lungs every 6 (six) hours as needed for wheezing or shortness of breath. 1 Inhaler 1  . Fluticasone-Salmeterol (ADVAIR DISKUS) 100-50 MCG/DOSE AEPB Inhale 1 puff into the lungs 2 (two) times daily. 60 each 1  . hydrochlorothiazide (HYDRODIURIL) 25 MG tablet Take 1 tablet (25 mg total) by mouth daily. 90 tablet 1  . meclizine (ANTIVERT) 25 MG tablet TAKE 1 TABLET(25 MG) BY MOUTH TWICE DAILY AS NEEDED FOR DIZZINESS 30 tablet 0   No current facility-administered medications on file prior to visit.     PAST MEDICAL HISTORY: Past Medical History:  Diagnosis Date  . Anxiety   . Asthma   . Back pain    . Dyspnea   . HTN (hypertension)   . Joint pain   . Lower extremity edema   . Metabolic syndrome   . Morbid obesity ([place])   . Prediabetes     PAST SURGICAL HISTORY: Past Surgical History:  Procedure Laterality Date  . BREAST REDUCTION SURGERY  2008  . CESAREAN SECTION    . DILATION AND CURETTAGE OF UTERUS    . INDUCED ABORTION    . uterine ablation      SOCIAL HISTORY: Social History   Tobacco Use  . Smoking status: Never Smoker  . Smokeless tobacco: Never Used  Substance Use Topics  . Alcohol use: Yes    Comment: socially   . Drug use: No    FAMILY HISTORY: Family History  Problem Relation Age of Onset  . Hypertension Mother   . Obesity Mother   . Hypertension Father   . Diabetes Father   . Diabetes Sister   . Stroke Sister 58    ROS: Review of Systems  Constitutional: Positive for malaise/fatigue and weight loss.  Eyes:       Negative exophthalmos  Cardiovascular: Negative for palpitations.  Gastrointestinal: Negative for nausea and vomiting.  Genitourinary: Negative for frequency.  Musculoskeletal:       Negative muscle weakness  Endo/Heme/Allergies: Negative for polydipsia.       Positive polyphagia Negative hypoglycemia    PHYSICAL EXAM: Blood pressure 135/62, pulse 81, height 5\' 1"  (1.549 m), weight 253 lb (114.8 kg), SpO2 98 %. Body mass index is 47.8 kg/m. Physical Exam Vitals signs reviewed.  Constitutional:      Appearance: Normal appearance. She is obese.  Cardiovascular:     Rate and Rhythm: Normal rate.     Pulses: Normal pulses.     Heart sounds: Murmur present.  Pulmonary:     Effort: Pulmonary effort is normal.     Breath sounds: Normal breath sounds.  Musculoskeletal: Normal range of motion.  Skin:    General: Skin is warm and dry.  Neurological:     Mental Status: She is alert and oriented to person, place, and time.  Psychiatric:        Mood and Affect: Mood normal.        Behavior: Behavior normal.     RECENT  LABS AND TESTS: BMET    Component Value Date/Time   NA 142 11/18/2018 0900   K 4.0 11/18/2018 0900   CL 106 11/18/2018 0900   CO2 22 11/18/2018 0900   GLUCOSE 87 11/18/2018 0900   GLUCOSE 114 (H) 08/21/2017 1009   BUN 8 11/18/2018 0900   CREATININE 0.73 11/18/2018 0900   CREATININE 0.77 08/21/2017 1009   CALCIUM 9.1 11/18/2018 0900   GFRNONAA 100 11/18/2018 0900   GFRNONAA 94 08/21/2017 1009   GFRAA 115 11/18/2018 0900   GFRAA 109 08/21/2017 1009   Lab Results  Component Value Date   HGBA1C 5.5 11/18/2018   HGBA1C 5.8 (H) 09/16/2018   HGBA1C 5.5 02/25/2018   HGBA1C 5.8% 11/11/2017   Lab Results  Component Value Date   INSULIN 17.7 11/18/2018   CBC    Component Value Date/Time   WBC 6.2 11/18/2018 0900   WBC 6.4 08/21/2017 1009   RBC 4.13 11/18/2018 0900   RBC 4.23 08/21/2017 1009   HGB 11.6 11/18/2018 0900   HCT 33.5 (L) 11/18/2018 0900   PLT 242 09/16/2018 1650   MCV 81 11/18/2018 0900   MCH 28.1 11/18/2018 0900   MCH 27.9 08/21/2017 1009   MCHC 34.6 11/18/2018 0900   MCHC 33.9 08/21/2017 1009   RDW 13.8 11/18/2018 0900   LYMPHSABS 2.1 11/18/2018 0900   MONOABS 588 12/26/2016 1525   EOSABS 0.1 11/18/2018 0900   BASOSABS 0.0 11/18/2018 0900   Iron/TIBC/Ferritin/ %Sat No results found for: IRON, TIBC, FERRITIN, IRONPCTSAT Lipid Panel     Component Value Date/Time   CHOL 155 11/18/2018 0900   TRIG 88 11/18/2018 0900   HDL 43 11/18/2018 0900   CHOLHDL 4.3 02/25/2018 0934   CHOLHDL 3.6 01/10/2017 0823   VLDL 12 01/10/2017 0823   LDLCALC 94 11/18/2018 0900   Hepatic Function Panel     Component Value Date/Time   PROT 6.1 11/18/2018 0900   ALBUMIN 3.8 11/18/2018 0900   AST 21 11/18/2018 0900   ALT 19 11/18/2018 0900   ALKPHOS 90 11/18/2018 0900   BILITOT 0.3 11/18/2018 0900      Component Value Date/Time   TSH <0.006 (L) 11/18/2018 0900   TSH <0.006 (L) 09/16/2018 1650   TSH <0.006 (L) 02/25/2018 0934      OBESITY BEHAVIORAL INTERVENTION  VISIT  Today's visit was # 2   Starting weight: 259 lbs Starting date: 11/18/2018 Today's weight :  253 lbs  Today's date: 12/02/2018 Total lbs lost to date: 6    12/02/2018  Height 5\' 1"  (1.549 m)  Weight 253 lb (114.8 kg)  BMI (Calculated) 47.83  BLOOD PRESSURE - SYSTOLIC 622  BLOOD PRESSURE - DIASTOLIC 62   Body Fat % 29.7 %  Total Body Water (lbs) 90.2 lbs     ASK: We discussed the diagnosis of obesity with Marquette Saa today and Tytiana agreed to give [name] permission to discuss obesity behavioral modification therapy today.  ASSESS: Amando has the diagnosis of obesity and her BMI today is 52.83 Adaisha is in the action stage of change   ADVISE: Danna was educated on the multiple health risks of obesity as well as the benefit of weight loss to improve her health. She was advised of the need for long term treatment and the importance of lifestyle modifications to improve her current health and to decrease her risk of future health problems.  AGREE: Multiple dietary modification options and treatment options were discussed and  November agreed to follow the recommendations documented in the above note.  ARRANGE: Porter was educated on the importance of frequent visits to treat obesity as outlined per CMS and USPSTF guidelines and agreed to schedule her next follow up appointment today.  I, Trixie Dredge, am acting as transcriptionist for Dennard Nip, MD  I have reviewed the above documentation for accuracy and completeness, and I agree with the above. -Dennard Nip, MD

## 2018-12-03 ENCOUNTER — Ambulatory Visit (HOSPITAL_COMMUNITY): Payer: BC Managed Care – PPO | Attending: Cardiology

## 2018-12-03 DIAGNOSIS — R011 Cardiac murmur, unspecified: Secondary | ICD-10-CM | POA: Diagnosis not present

## 2018-12-11 IMAGING — US US THYROID
1 series · 13 of 25 positions shown · non-contrast
Comparison: None.

CLINICAL DATA: Other.  Low TSH

EXAM:
THYROID ULTRASOUND
TECHNIQUE: Ultrasound examination of the thyroid gland and adjacent soft
tissues was performed.

[Series 1: us thyroid · 0.04mm/px · 13 of 58 slices shown]
[im 1/58]
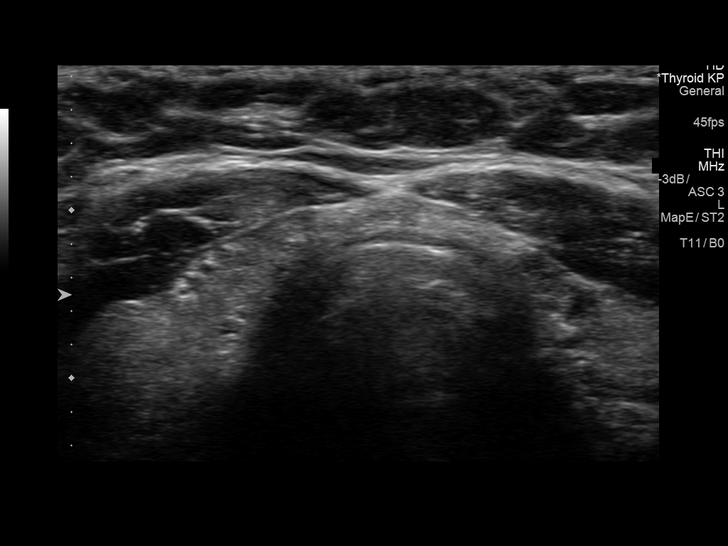
[im 5/58]
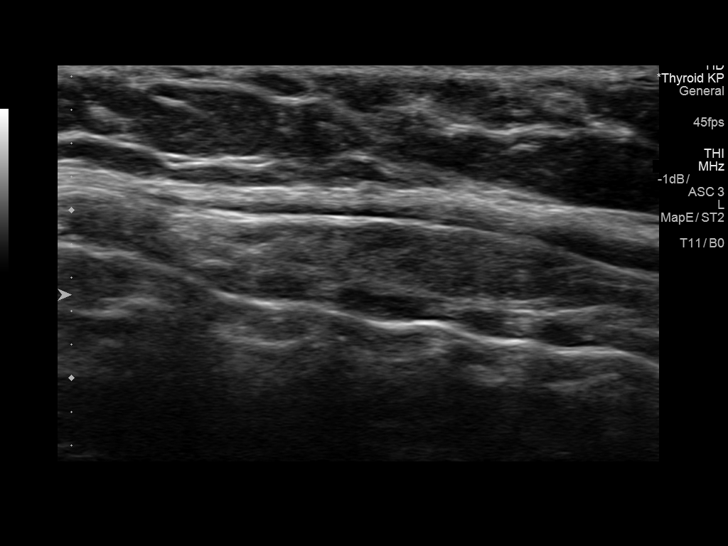
[im 10/58]
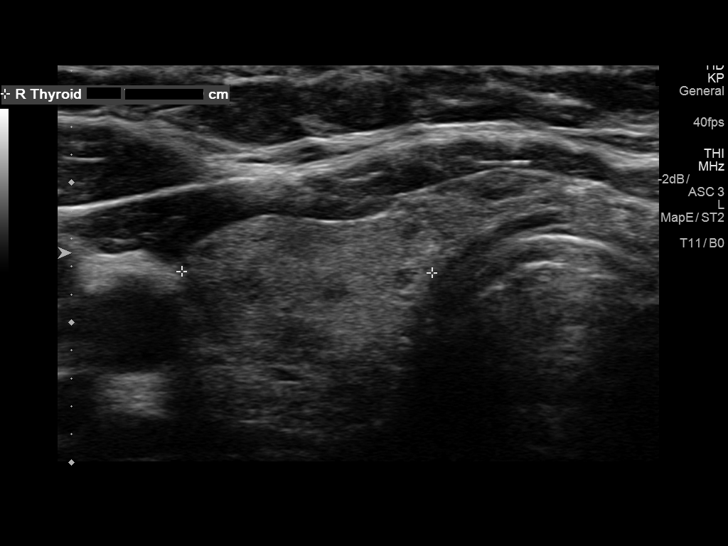
[im 15/58]
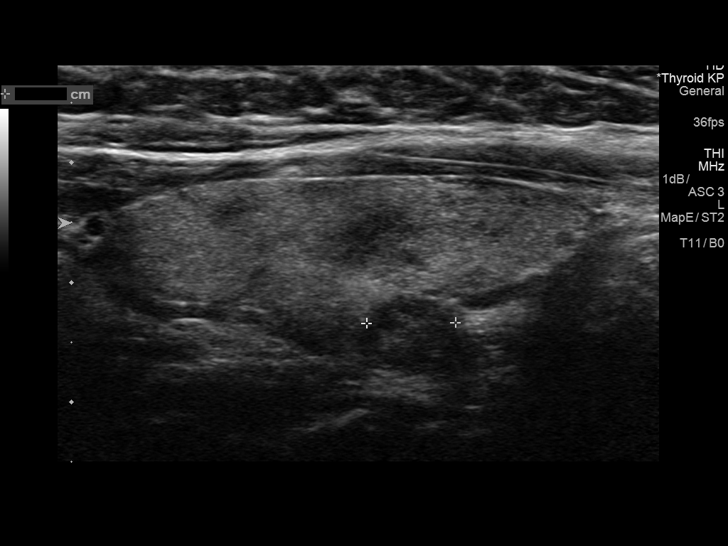
[im 20/58]
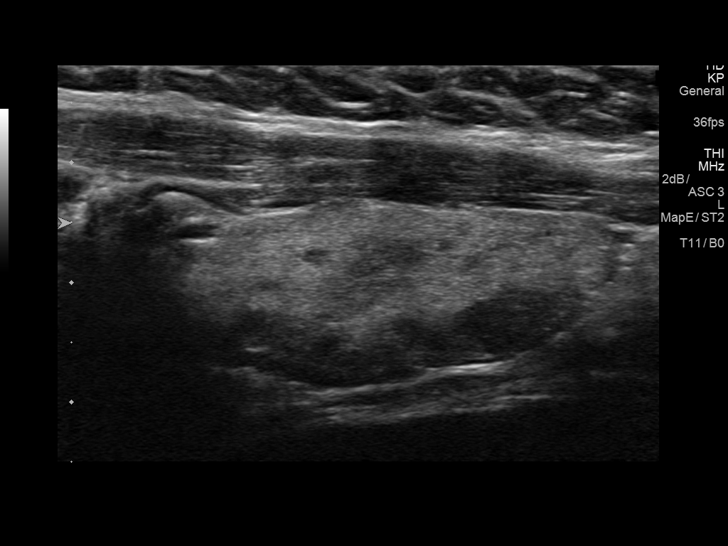
[im 24/58]
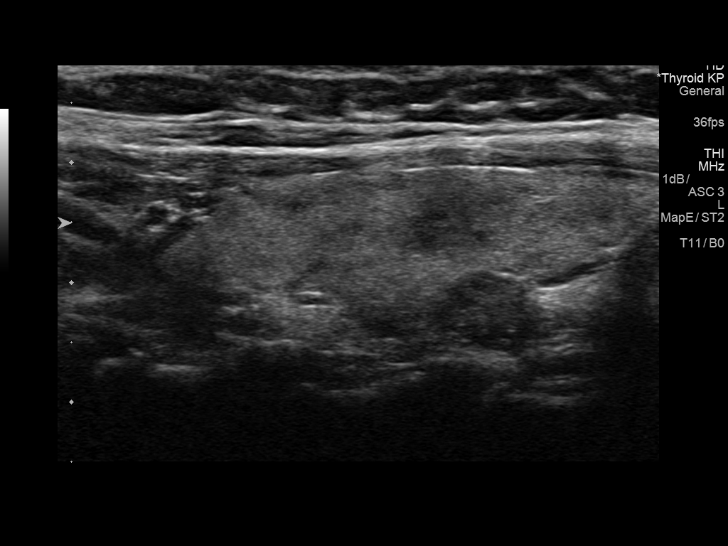
[im 29/58]
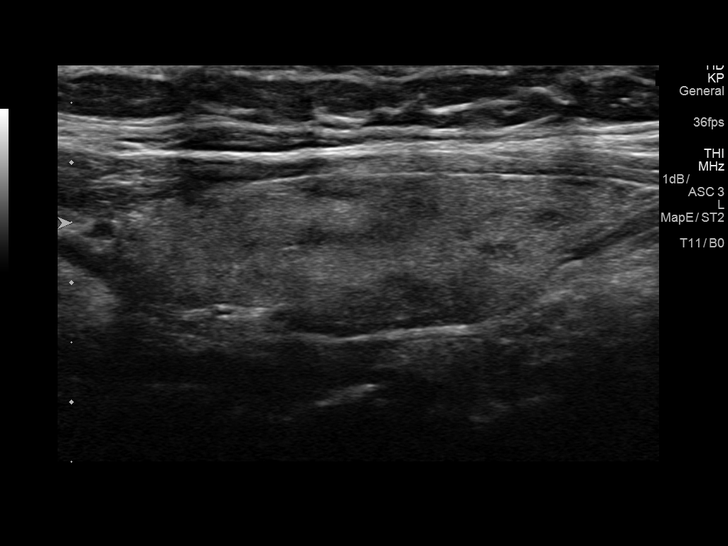
[im 34/58]
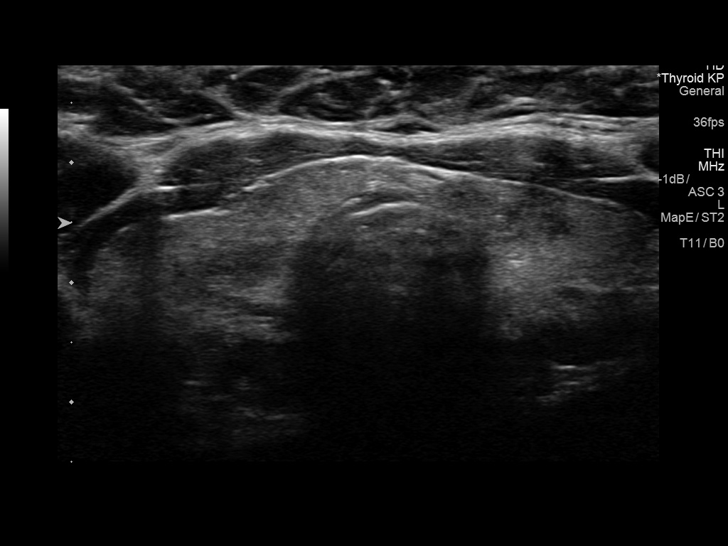
[im 39/58]
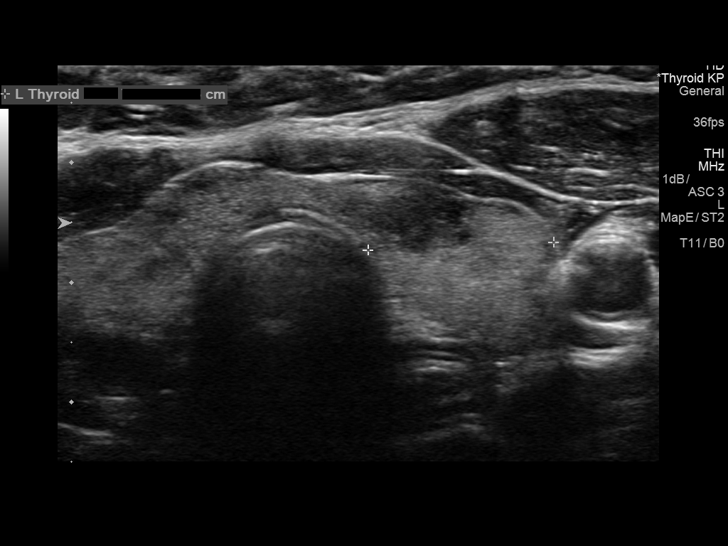
[im 43/58]
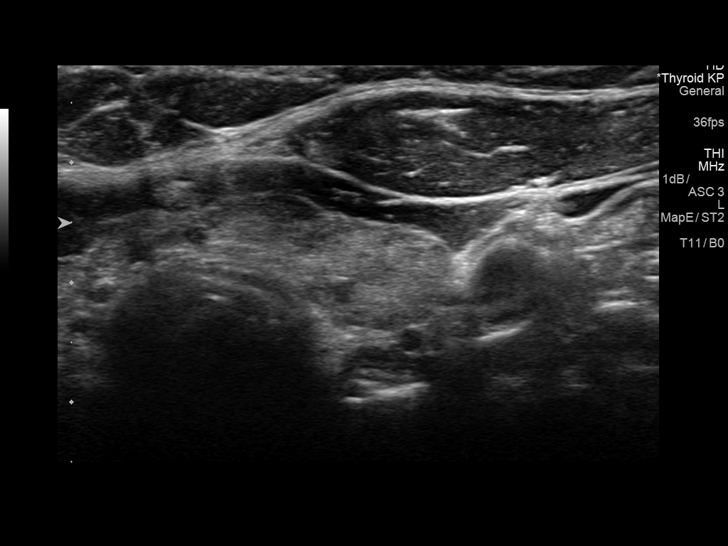
[im 48/58]
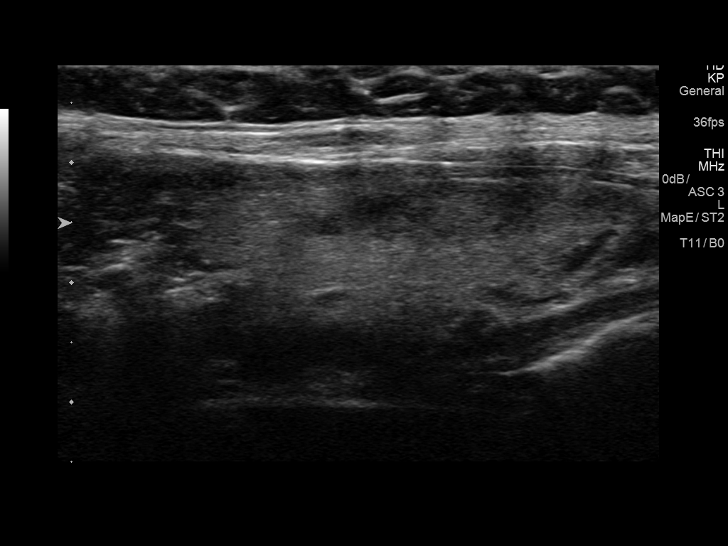
[im 53/58]
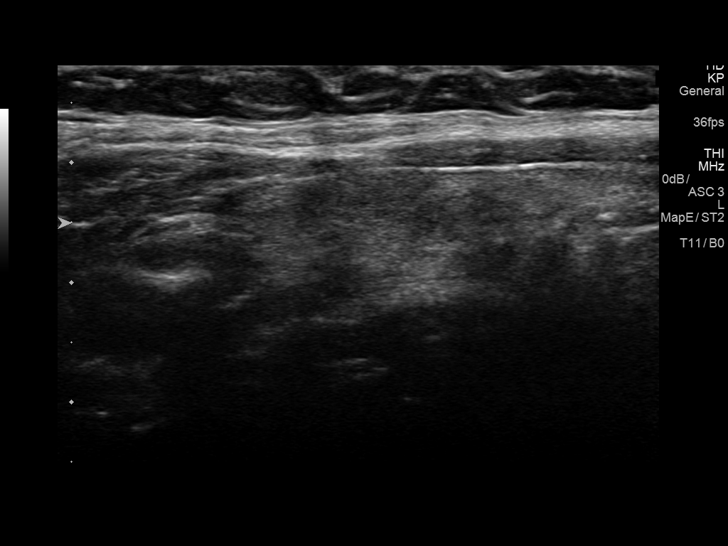
[im 58/58]
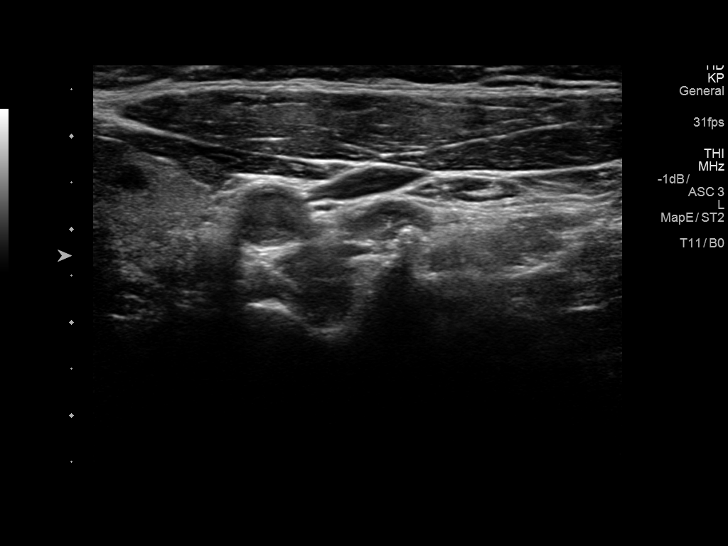

[13 of 25 positions shown; findings below may reference images not displayed]

FINDINGS: Parenchymal Echotexture: Moderately heterogenous

Isthmus: 0.4 cm

Right lobe: 4.5 x 1.2 x 1.8 cm

Left lobe: 4.1 x 1.3 x 1.6 cm

_________________________________________________________

Estimated total number of nodules >/= 1 cm: 0

Number of spongiform nodules >/=  2 cm not described below (TR1): 0

Number of mixed cystic and solid nodules >/= 1.5 cm not described
below (TR2): 0

_________________________________________________________

Nodule versus pseudo nodule # 1:

Location: Right; Mid

Maximum size: 0.8 cm; Other 2 dimensions: 0.7 x 0.6 cm

Composition: solid/almost completely solid (2)

Echogenicity: hypoechoic (2)

Shape: not taller-than-wide (0)

Margins: smooth (0)

Echogenic foci: none (0)

ACR TI-RADS total points: 4.

ACR TI-RADS risk category: TR4 (4-6 points).

ACR TI-RADS recommendations:

Given size (<0.9 cm) and appearance, this nodule does NOT meet
TI-RADS criteria for biopsy or dedicated follow-up.

_________________________________________________________
IMPRESSION: Right mid lobe nodule versus pseudo nodule does not meet criteria
for biopsy or follow-up. The gland is otherwise heterogeneous
without additional possible nodule.

The above is in keeping with the ACR TI-RADS recommendations - [HOSPITAL] 2290;[DATE].

## 2018-12-15 ENCOUNTER — Ambulatory Visit: Payer: PRIVATE HEALTH INSURANCE | Admitting: Internal Medicine

## 2018-12-15 NOTE — Progress Notes (Deleted)
Patient ID: Kim Tanner, female   DOB: [birth year], 48 y.o.   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  Patient was diagnosed with thyrotoxicosis years ago, and she did see endocrinology (no records available), but did not follow-up with them.  I reviewed pt's thyroid tests: Lab Results  Component Value Date   TSH <0.006 (L) 11/18/2018   TSH <0.006 (L) 09/16/2018   TSH <0.006 (L) 02/25/2018   TSH 0.01 (L) 08/21/2017   TSH 0.01 (L) 01/10/2017   TSH 0.01 (L) 12/26/2016   FREET4 2.14 (H) 11/18/2018   FREET4 1.69 09/16/2018   FREET4 1.63 02/25/2018   FREET4 1.4 08/21/2017   FREET4 1.3 12/26/2016   T3FREE 3.8 12/26/2016   Antithyroid antibodies: No results found for: TSI  Thyroid U/S (01/21/2017): Moderately heterogeneous thyroid of normal size, with 1 possible pseudo-nodule  Pt denies: - feeling nodules in neck - hoarseness - dysphagia - choking - SOB with lying down  She denies: - fatigue - excessive sweating/heat intolerance - tremors - anxiety - palpitations - hyperdefecation - hair loss  [facility]  Pt does not have a FH of thyroid ds. No FH of thyroid cancer. No h/o radiation tx to head or neck.  No seaweed or kelp, no recent contrast studies. No steroid use. No herbal supplements. No Biotin use.  Pt. also has a history of prediabetes - started Metformin by Dr. Leafy Ro 11/2018.  ROS: Constitutional: + see HPI Eyes: no blurry vision, no xerophthalmia ENT: no sore throat, + see HPI Cardiovascular: no CP/SOB/palpitations/leg swelling Respiratory: no cough/SOB Gastrointestinal: no N/V/D/C Musculoskeletal: no muscle/joint aches Skin: no rashes Neurological: no tremors/numbness/tingling/dizziness Psychiatric: no depression/anxiety  PE: There were no vitals taken for this  visit. Wt Readings from Last 3 Encounters:  12/02/18 253 lb (114.8 kg)  11/18/18 259 lb (117.5 kg)  10/13/18 263 lb (119.3 kg)   Constitutional: overweight, in NAD Eyes: PERRLA, EOMI, no exophthalmos, no lid lag, no stare ENT: moist mucous membranes, no thyromegaly, no thyroid bruits, no cervical lymphadenopathy Cardiovascular: RRR, No MRG Respiratory: CTA B Gastrointestinal: abdomen soft, NT, ND, BS+ Musculoskeletal: no deformities, strength intact in all 4 Skin: moist, warm, no rashes Neurological: no tremor with outstretched hands, DTR normal in all 4  ASSESSMENT: 1. Thyrotoxicosis  PLAN:  1. Patient with a recently found low TSH, with thyrotoxic sxs: weight loss, heat intolerance, hyperdefecation, palpitations, anxiety.  - she does not appear to have exogenous causes for the low TSH.  - We discussed that possible causes of thyrotoxicosis are:  Marland Kitchen Graves ds   . Thyroiditis . toxic multinodular goiter/ toxic adenoma (I cannot feel nodules at palpation of her thyroid). - will check the TSH, fT3 and fT4 and also add thyroid stimulating antibodies to screen for Graves' disease.  - If the tests remain abnormal, we may need an uptake and scan to differentiate between the 3 above possible etiologies  - we discussed about possible modalities of treatment for the above conditions, to include methimazole use, radioactive iodine ablation or (last resort) surgery. - we may need to do thyroid ultrasound depending on the results of the uptake and scan (if a cold nodule is present) - I do not feel that we need to add beta blockers at this time, since she is not tachycardic or tremulous - Discussed about  the possibility of Graves' ophthalmopathy; she does not have any double vision, blurry vision, eye pain, chemosis. - I advised her to join my chart to communicate easier - RTC in 3 months, but likely sooner for repeat labs  Philemon Kingdom, MD PhD [facility]

## 2018-12-18 ENCOUNTER — Other Ambulatory Visit: Payer: Self-pay

## 2018-12-19 ENCOUNTER — Ambulatory Visit (INDEPENDENT_AMBULATORY_CARE_PROVIDER_SITE_OTHER): Payer: BC Managed Care – PPO | Admitting: Internal Medicine

## 2018-12-19 ENCOUNTER — Encounter: Payer: Self-pay | Admitting: Internal Medicine

## 2018-12-19 VITALS — BP 126/80 | HR 77 | Temp 98.2°F | Ht 61.0 in | Wt 260.0 lb

## 2018-12-19 DIAGNOSIS — E0590 Thyrotoxicosis, unspecified without thyrotoxic crisis or storm: Secondary | ICD-10-CM | POA: Diagnosis not present

## 2018-12-19 LAB — T3, FREE: T3, Free: 5.3 pg/mL — ABNORMAL HIGH (ref 2.3–4.2)

## 2018-12-19 LAB — T4, FREE: Free T4: 1.53 ng/dL (ref 0.60–1.60)

## 2018-12-19 LAB — TSH: TSH: <0.01 u[IU]/mL — ABNORMAL LOW (ref 0.35–4.50)

## 2018-12-19 NOTE — Patient Instructions (Signed)
Please stop at the lab.  After the results return, we may need to start methimazole.  Please stop the Methimazole (Tapazole) and call us or your primary care doctor if you develop: - sore throat - fever - yellow skin - dark urine - light colored stools As we will then need to check your blood counts and liver tests.  Please come back for a follow-up appointment in 3-4 months.   Hyperthyroidism  Hyperthyroidism is when the thyroid gland is too active (overactive). The thyroid gland is a small gland located in the lower front part of the neck, just in front of the windpipe (trachea). This gland makes hormones that help control how the body uses food for energy (metabolism) as well as how the heart and brain function. These hormones also play a role in keeping your bones strong. When the thyroid is overactive, it produces too much of a hormone called thyroxine. What are the causes? This condition may be caused by:  Graves' disease. This is a disorder in which the body's disease-fighting system (immune system) attacks the thyroid gland. This is the most common cause.  Inflammation of the thyroid gland.  A tumor in the thyroid gland.  Use of certain medicines, including: ? Prescription thyroid hormone replacement. ? Herbal supplements that mimic thyroid hormones. ? Amiodarone therapy.  Solid or fluid-filled lumps within your thyroid gland (thyroid nodules).  Taking in a large amount of iodine from foods or medicines. What increases the risk? You are more likely to develop this condition if:  You are female.  You have a family history of thyroid conditions.  You smoke tobacco.  You use a medicine called lithium.  You take medicines that affect the immune system (immunosuppressants). What are the signs or symptoms? Symptoms of this condition include:  Nervousness.  Inability to tolerate heat.  Unexplained weight loss.  Diarrhea.  Change in the texture of hair or  skin.  Heart skipping beats or making extra beats.  Rapid heart rate.  Loss of menstruation.  Shaky hands.  Fatigue.  Restlessness.  Sleep problems.  Enlarged thyroid gland or a lump in the thyroid (nodule). You may also have symptoms of Graves' disease, which may include:  Protruding eyes.  Dry eyes.  Red or swollen eyes.  Problems with vision. How is this diagnosed? This condition may be diagnosed based on:  Your symptoms and medical history.  A physical exam.  Blood tests.  Thyroid ultrasound. This test involves using sound waves to produce images of the thyroid gland.  A thyroid scan. A radioactive substance is injected into a vein, and images show how much iodine is present in the thyroid.  Radioactive iodine uptake test (RAIU). A small amount of radioactive iodine is given by mouth to see how much iodine the thyroid absorbs after a certain amount of time. How is this treated? Treatment depends on the cause and severity of the condition. Treatment may include:  Medicines to reduce the amount of thyroid hormone your body makes.  Radioactive iodine treatment (radioiodine therapy). This involves swallowing a small dose of radioactive iodine, in capsule or liquid form, to kill thyroid cells.  Surgery to remove part or all of your thyroid gland. You may need to take thyroid hormone replacement medicine for the rest of your life after thyroid surgery.  Medicines to help manage your symptoms. Follow these instructions at home:   Take over-the-counter and prescription medicines only as told by your health care provider.  Do not use  any products that contain nicotine or tobacco, such as cigarettes and e-cigarettes. If you need help quitting, ask your health care provider.  Follow any instructions from your health care provider about diet. You may be instructed to limit foods that contain iodine.  Keep all follow-up visits as told by your health care provider.  This is important. ? You will need to have blood tests regularly so that your health care provider can monitor your condition. Contact a health care provider if:  Your symptoms do not get better with treatment.  You have a fever.  You are taking thyroid hormone replacement medicine and you: ? Have symptoms of depression. ? Feel like you are tired all the time. ? Gain weight. Get help right away if:  You have chest pain.  You have decreased alertness or a change in your awareness.  You have abdominal pain.  You feel dizzy.  You have a rapid heartbeat.  You have an irregular heartbeat.  You have difficulty breathing. Summary  The thyroid gland is a small gland located in the lower front part of the neck, just in front of the windpipe (trachea).  Hyperthyroidism is when the thyroid gland is too active (overactive) and produces too much of a hormone called thyroxine.  The most common cause is Graves' disease, a disorder in which your immune system attacks the thyroid gland.  Hyperthyroidism can cause various symptoms, such as unexplained weight loss, nervousness, inability to tolerate heat, or changes in your heartbeat.  Treatment may include medicine to reduce the amount of thyroid hormone your body makes, radioiodine therapy, surgery, or medicines to manage symptoms. This information is not intended to replace advice given to you by your health care provider. Make sure you discuss any questions you have with your health care provider. Document Released: 09/17/2005 Document Revised: 08/28/2017 Document Reviewed: 08/28/2017 Elsevier Interactive Patient Education  2019 [employer].

## 2018-12-19 NOTE — Progress Notes (Signed)
Patient ID: Kim Tanner, female   DOB: [birth year], 48 y.o.   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  Patient was diagnosed with thyrotoxicosis in 11/2016.  She was referred to endocrinology then, but could not come at that time, and only comes to see me today.  I reviewed pt's thyroid tests: Lab Results  Component Value Date   TSH <0.006 (L) 11/18/2018   TSH <0.006 (L) 09/16/2018   TSH <0.006 (L) 02/25/2018   TSH 0.01 (L) 08/21/2017   TSH 0.01 (L) 01/10/2017   TSH 0.01 (L) 12/26/2016   FREET4 2.14 (H) 11/18/2018   FREET4 1.69 09/16/2018   FREET4 1.63 02/25/2018   FREET4 1.4 08/21/2017   FREET4 1.3 12/26/2016   T3FREE 3.8 12/26/2016   Antithyroid antibodies: No results found for: TSI   Thyroid U/S (01/21/2017): Moderately heterogeneous thyroid of normal size, with 1 possible pseudo-nodule  Pt denies: - feeling nodules in neck - hoarseness - dysphagia - choking - SOB with lying down  She denies: - excessive sweating/heat intolerance - tremors - anxiety - palpitations - hyperdefecation - weight loss  She does have: - hair loss - started 1 year ago - lack of energy  She sees Dr. Leafy Ro in the weight management clinic.  She also has a history of prediabetes and was started on metformin by Dr. Leafy Ro in 11/2018.  Pt does not have a FH of thyroid ds. No FH of thyroid cancer. No h/o radiation tx to head or neck.  No seaweed or kelp, no recent contrast studies. No po steroid use - Advair. No herbal supplements. No Biotin use. + MVI - women's.  Pt. also has a history of HTN - on HCTZ.  ROS: Constitutional: + see HPI Eyes: no blurry vision, no xerophthalmia ENT: no sore throat, + see HPI Cardiovascular: no CP/SOB/palpitations/leg swelling Respiratory: no cough/SOB Gastrointestinal: no N/V/D/C Musculoskeletal: no muscle/joint  aches Skin: no rashes Neurological: no tremors/numbness/tingling/dizziness Psychiatric: no depression/anxiety  Past Medical History:  Diagnosis Date  . Anxiety   . Asthma   . Back pain   . Dyspnea   . HTN (hypertension)   . Joint pain   . Lower extremity edema   . Metabolic syndrome   . Morbid obesity ([place])   . Prediabetes    Past Surgical History:  Procedure Laterality Date  . BREAST REDUCTION SURGERY  2008  . CESAREAN SECTION    . DILATION AND CURETTAGE OF UTERUS    . INDUCED ABORTION    . uterine ablation     Social History   Socioeconomic History  . Marital status: Single    Spouse name: Not on file  . Number of children: 2  . Years of education: Not on file  . Highest education level: Not on file  Occupational History  . Occupation: Exceptional Presenter, broadcasting: [employer]  Social Needs  . Financial resource strain: Not on file  . Food insecurity:    Worry: Not on file    Inability: Not on file  . Transportation needs:    Medical: Not on file    Non-medical: Not on file  Tobacco Use  . Smoking status: Never Smoker  . Smokeless tobacco: Never Used  Substance and Sexual Activity  . Alcohol use: Yes    Comment: socially   . Drug use: No  . Sexual activity: Not  Currently    Birth control/protection: Condom  Lifestyle  . Physical activity:    Days per week: Not on file    Minutes per session: Not on file  . Stress: Not on file  Relationships  . Social connections:    Talks on phone: Not on file    Gets together: Not on file    Attends religious service: Not on file    Active member of club or organization: Not on file    Attends meetings of clubs or organizations: Not on file    Relationship status: Not on file  . Intimate partner violence:    Fear of current or ex partner: Not on file    Emotionally abused: Not on file    Physically abused: Not on file    Forced sexual activity: Not on file  Other Topics Concern  . Not on  file  Social History Narrative  . Not on file   Current Outpatient Medications on File Prior to Visit  Medication Sig Dispense Refill  . albuterol (PROVENTIL HFA;VENTOLIN HFA) 108 (90 Base) MCG/ACT inhaler Inhale 2 puffs into the lungs every 6 (six) hours as needed for wheezing or shortness of breath. 1 Inhaler 1  . Fluticasone-Salmeterol (ADVAIR DISKUS) 100-50 MCG/DOSE AEPB Inhale 1 puff into the lungs 2 (two) times daily. 60 each 1  . hydrochlorothiazide (HYDRODIURIL) 25 MG tablet Take 1 tablet (25 mg total) by mouth daily. 90 tablet 1  . meclizine (ANTIVERT) 25 MG tablet TAKE 1 TABLET(25 MG) BY MOUTH TWICE DAILY AS NEEDED FOR DIZZINESS 30 tablet 0  . metFORMIN (GLUCOPHAGE) 500 MG tablet Take 1 tablet (500 mg total) by mouth daily with breakfast. 30 tablet 0  . Vitamin D, Ergocalciferol, (DRISDOL) 1.25 MG (50000 UT) CAPS capsule Take 1 capsule (50,000 Units total) by mouth every 7 (seven) days. 4 capsule 0   No current facility-administered medications on file prior to visit.    Allergies  Allergen Reactions  . Compazine [Prochlorperazine Edisylate]    Family History  Problem Relation Age of Onset  . Hypertension Mother   . Obesity Mother   . Hypertension Father   . Diabetes Father   . Diabetes Sister   . Stroke Sister 100    PE: BP 126/80   Pulse 77   Temp 98.2 F (36.8 C)   Ht 5\' 1"  (1.549 m) Comment: measured  Wt 260 lb (117.9 kg)   SpO2 98%   BMI 49.13 kg/m  Wt Readings from Last 3 Encounters:  12/19/18 260 lb (117.9 kg)  12/02/18 253 lb (114.8 kg)  11/18/18 259 lb (117.5 kg)   Constitutional: overweight, in NAD Eyes: PERRLA, EOMI, no exophthalmos, no lid lag, no stare ENT: moist mucous membranes, no thyromegaly, no thyroid bruits, no cervical lymphadenopathy Cardiovascular: RRR, No RG, +1/6 SEM Respiratory: CTA B Gastrointestinal: abdomen soft, NT, ND, BS+ Musculoskeletal: no deformities, strength intact in all 4 Skin: moist, warm, no rashes; + acanthosis  nigricans on neck, + skin tags on neck Neurological: no tremor with outstretched hands, DTR normal in all 4  ASSESSMENT: 1. Thyrotoxicosis  PLAN:  1. Patient with a long history of thyrotoxicosis, since at least 2 years ago, without thyrotoxic sxs: weight loss, heat intolerance, hyperdefecation, palpitations, anxiety.  She does complain of decreased energy and hair loss for the last year, though. - she does not appear to have exogenous causes for the low TSH.  - We discussed that possible causes of thyrotoxicosis are:  Marland Kitchen Graves ds   .  Thyroiditis . toxic multinodular goiter/ toxic adenoma (I cannot feel nodules at palpation of her thyroid and a thyroid ultrasound performed 2 years ago did not show nodules, but only a pseudo-nodule). - will check the TSH, fT3 and fT4 and also add thyroid stimulating antibodies to screen for Graves' disease.  - If the tests remain abnormal, we may need an uptake and scan to differentiate between the 3 above possible etiologies  - we discussed about possible modalities of treatment for the above conditions, to include methimazole use, radioactive iodine ablation or (last resort) surgery. - I do not feel that we need to add beta blockers at this time, since she is not tachycardic or tremulous - No signs of Graves' ophthalmopathy; she does not have any double vision, blurry vision, eye pain, chemosis. - RTC in 3-4 months, but likely sooner for repeat labs  Component     Latest Ref Rng & Units 12/19/2018  TSH     0.35 - 4.50 uIU/mL <0.01 (L)  T4,Free(Direct)     0.60 - 1.60 ng/dL 1.53  Triiodothyronine,Free,Serum     2.3 - 4.2 pg/mL 5.3 (H)  TSI     <140 % baseline 386 (H)   TSH undetectably low, free T4 normal, free T3 slightly high.  TSI antibodies elevated.  Picture consistent with Graves' disease.  No need for uptake and scan at this time.  We will need to start methimazole 5 mg twice a day and I will have her back for labs in 5 weeks.  Philemon Kingdom, MD PhD [facility] Endocrinology

## 2018-12-23 ENCOUNTER — Ambulatory Visit (INDEPENDENT_AMBULATORY_CARE_PROVIDER_SITE_OTHER): Payer: BC Managed Care – PPO | Admitting: Family Medicine

## 2018-12-23 LAB — THYROID STIMULATING IMMUNOGLOBULIN: TSI: 386 % baseline — ABNORMAL HIGH (ref <140)

## 2018-12-23 MED ORDER — METHIMAZOLE 5 MG PO TABS: 5.0000 mg | ORAL_TABLET | Freq: Two times a day (BID) | ORAL | 1 refills | Status: DC

## 2018-12-25 ENCOUNTER — Encounter (INDEPENDENT_AMBULATORY_CARE_PROVIDER_SITE_OTHER): Payer: Self-pay

## 2018-12-28 ENCOUNTER — Other Ambulatory Visit: Payer: Self-pay | Admitting: Family Medicine

## 2018-12-29 ENCOUNTER — Other Ambulatory Visit: Payer: Self-pay

## 2018-12-29 ENCOUNTER — Encounter (INDEPENDENT_AMBULATORY_CARE_PROVIDER_SITE_OTHER): Payer: Self-pay | Admitting: Family Medicine

## 2018-12-29 ENCOUNTER — Ambulatory Visit (INDEPENDENT_AMBULATORY_CARE_PROVIDER_SITE_OTHER): Payer: BC Managed Care – PPO | Admitting: Family Medicine

## 2018-12-29 DIAGNOSIS — Z6841 Body Mass Index (BMI) 40.0 and over, adult: Secondary | ICD-10-CM

## 2018-12-29 DIAGNOSIS — F3289 Other specified depressive episodes: Secondary | ICD-10-CM

## 2018-12-29 DIAGNOSIS — R7303 Prediabetes: Secondary | ICD-10-CM

## 2018-12-29 MED ORDER — BUPROPION HCL ER (SR) 150 MG PO TB12: 150.0000 mg | ORAL_TABLET | Freq: Every day | ORAL | 0 refills | Status: DC

## 2018-12-30 NOTE — Progress Notes (Signed)
Office: [phone]  /  Fax: [phone] TeleHealth Visit:  Kim Tanner has consented to this TeleHealth visit today via Face Time. The patient is located at home, the provider is located at the [facility] office. The participants in this visit include the listed provider and patient.   HPI:   Chief Complaint: OBESITY Kim Tanner is here to discuss her progress with her obesity treatment plan. She is on the Category 2 plan and is following her eating plan approximately 20 % of the time. She states she is taking short walks 20 minutes 3 times per week. [name] notes increased stress eating while on COVID19 lockdown. She is struggling with her new routine and noted feeling more out of control.  We were unable to weigh the patient today for this TeleHealth visit. She feels as if she has gained weight since her last visit. She has lost 6 lbs since starting treatment with Kim Tanner.  Depression with emotional eating behaviors Kim Tanner struggled with increased stress eating and using food for comfort to the extent that it is negatively impacting her health. She is not sleeping well and notes increased irritability which has been worse in the last 2 weeks since the [place]. She is frustrated that she is giving in to her emotional eating. Kim Tanner sometimes feels she is out of control and then feels guilty that she made poor food choices. Her blood pressure has been controlled. She has been working on behavior modification techniques to help reduce her emotional eating and has been somewhat successful. She shows no sign of suicidal or homicidal ideations.  Pre-Diabetes Kim Tanner has a diagnosis of pre-diabetes based on her elevated Hgb A1c and was informed this puts her at greater risk of developing diabetes. She notes GI upset with metformin, especially when she eats poorly. She has not been taking it regularly and she is not following her diet prescription very well.  ASSESSMENT AND  PLAN:  Prediabetes  Other depression - with emotional eating - Plan: buPROPion (WELLBUTRIN SR) 150 MG 12 hr tablet  Class 3 severe obesity with serious comorbidity and body mass index (BMI) of 45.0 to 49.9 in adult, unspecified obesity type [facility])  PLAN:  Pre-Diabetes Kim Tanner will continue to work on weight loss, exercise, and decreasing simple carbohydrates in her diet to help decrease the risk of diabetes. She was informed that eating too many simple carbohydrates or too many calories at one sitting increases the likelihood of GI side effects. Kim Tanner agreed to change metformin 500 mg 1/2 tablet qAM #30 with no refills and to get back to her diet plan. Kim Tanner agreed to follow up with Kim Tanner as directed to monitor her progress in 2 to 3 weeks.   Depression with Emotional Eating Behaviors We discussed behavior modification techniques today to help [name] deal with her emotional eating and depression. She has agreed to start Wellbutrin SR 150 mg qAM #30 with no refills and agreed to follow up as directed in 2 weeks.  Obesity Kim Tanner is currently in the action stage of change. As such, her goal is to continue with weight loss efforts. She has agreed to follow the Category 2 plan. Kim Tanner has been instructed to work up to a goal of 150 minutes of combined cardio and strengthening exercise per week for weight loss and overall health benefits. We discussed the following Behavioral Modification Stratagies today: increasing lean protein intake, decreasing simple carbohydrates, work on meal planning and easy cooking plans, emotional eating strategies, ways to avoid  boredom eating, better snacking choices, and ways to avoid night time snacking.  Kim Tanner has agreed to follow up with our clinic in 2 to 3 weeks. She was informed of the importance of frequent follow up visits to maximize her success with intensive lifestyle modifications for her multiple health conditions.  ALLERGIES: Allergies  Allergen  Reactions  . Compazine [Prochlorperazine Edisylate]     MEDICATIONS: Current Outpatient Medications on File Prior to Visit  Medication Sig Dispense Refill  . albuterol (PROVENTIL HFA;VENTOLIN HFA) 108 (90 Base) MCG/ACT inhaler Inhale 2 puffs into the lungs every 6 (six) hours as needed for wheezing or shortness of breath. 1 Inhaler 1  . Fluticasone-Salmeterol (ADVAIR DISKUS) 100-50 MCG/DOSE AEPB Inhale 1 puff into the lungs 2 (two) times daily. 60 each 1  . hydrochlorothiazide (HYDRODIURIL) 25 MG tablet TAKE 1 TABLET(25 MG) BY MOUTH DAILY 90 tablet 1  . meclizine (ANTIVERT) 25 MG tablet TAKE 1 TABLET(25 MG) BY MOUTH TWICE DAILY AS NEEDED FOR DIZZINESS 30 tablet 0  . metFORMIN (GLUCOPHAGE) 500 MG tablet Take 1 tablet (500 mg total) by mouth daily with breakfast. (Patient taking differently: Take 500 mg by mouth daily with breakfast. Take 1/2 tab daily) 30 tablet 0  . methimazole (TAPAZOLE) 5 MG tablet Take 1 tablet (5 mg total) by mouth 2 (two) times daily. 120 tablet 1  . Vitamin D, Ergocalciferol, (DRISDOL) 1.25 MG (50000 UT) CAPS capsule Take 1 capsule (50,000 Units total) by mouth every 7 (seven) days. 4 capsule 0   No current facility-administered medications on file prior to visit.     PAST MEDICAL HISTORY: Past Medical History:  Diagnosis Date  . Anxiety   . Asthma   . Back pain   . Dyspnea   . HTN (hypertension)   . Joint pain   . Lower extremity edema   . Metabolic syndrome   . Morbid obesity ([place])   . Prediabetes     PAST SURGICAL HISTORY: Past Surgical History:  Procedure Laterality Date  . BREAST REDUCTION SURGERY  2008  . CESAREAN SECTION    . DILATION AND CURETTAGE OF UTERUS    . INDUCED ABORTION    . uterine ablation      SOCIAL HISTORY: Social History   Tobacco Use  . Smoking status: Never Smoker  . Smokeless tobacco: Never Used  Substance Use Topics  . Alcohol use: Yes    Comment: socially   . Drug use: No    FAMILY HISTORY: Family History   Problem Relation Age of Onset  . Hypertension Mother   . Obesity Mother   . Hypertension Father   . Diabetes Father   . Diabetes Sister   . Stroke Sister 14    ROS: Review of Systems  Psychiatric/Behavioral: Positive for depression. Negative for suicidal ideas.       Negative for homicidal ideations.    PHYSICAL EXAM: Pt in no acute distress  RECENT LABS AND TESTS: BMET    Component Value Date/Time   NA 142 11/18/2018 0900   K 4.0 11/18/2018 0900   CL 106 11/18/2018 0900   CO2 22 11/18/2018 0900   GLUCOSE 87 11/18/2018 0900   GLUCOSE 114 (H) 08/21/2017 1009   BUN 8 11/18/2018 0900   CREATININE 0.73 11/18/2018 0900   CREATININE 0.77 08/21/2017 1009   CALCIUM 9.1 11/18/2018 0900   GFRNONAA 100 11/18/2018 0900   GFRNONAA 94 08/21/2017 1009   GFRAA 115 11/18/2018 0900   GFRAA 109 08/21/2017 1009   Lab  Results  Component Value Date   HGBA1C 5.5 11/18/2018   HGBA1C 5.8 (H) 09/16/2018   HGBA1C 5.5 02/25/2018   HGBA1C 5.8% 11/11/2017   Lab Results  Component Value Date   INSULIN 17.7 11/18/2018   CBC    Component Value Date/Time   WBC 6.2 11/18/2018 0900   WBC 6.4 08/21/2017 1009   RBC 4.13 11/18/2018 0900   RBC 4.23 08/21/2017 1009   HGB 11.6 11/18/2018 0900   HCT 33.5 (L) 11/18/2018 0900   PLT 242 09/16/2018 1650   MCV 81 11/18/2018 0900   MCH 28.1 11/18/2018 0900   MCH 27.9 08/21/2017 1009   MCHC 34.6 11/18/2018 0900   MCHC 33.9 08/21/2017 1009   RDW 13.8 11/18/2018 0900   LYMPHSABS 2.1 11/18/2018 0900   MONOABS 588 12/26/2016 1525   EOSABS 0.1 11/18/2018 0900   BASOSABS 0.0 11/18/2018 0900   Iron/TIBC/Ferritin/ %Sat No results found for: IRON, TIBC, FERRITIN, IRONPCTSAT Lipid Panel     Component Value Date/Time   CHOL 155 11/18/2018 0900   TRIG 88 11/18/2018 0900   HDL 43 11/18/2018 0900   CHOLHDL 4.3 02/25/2018 0934   CHOLHDL 3.6 01/10/2017 0823   VLDL 12 01/10/2017 0823   LDLCALC 94 11/18/2018 0900   Hepatic Function Panel      Component Value Date/Time   PROT 6.1 11/18/2018 0900   ALBUMIN 3.8 11/18/2018 0900   AST 21 11/18/2018 0900   ALT 19 11/18/2018 0900   ALKPHOS 90 11/18/2018 0900   BILITOT 0.3 11/18/2018 0900      Component Value Date/Time   TSH <0.01 (L) 12/19/2018 0854   TSH <0.006 (L) 11/18/2018 0900   TSH <0.006 (L) 09/16/2018 1650   Results for TAISA, DELORIA (MRN [id number]) as of 12/30/2018 07:14  Ref. Range 11/18/2018 09:00  Vitamin D, 25-Hydroxy Latest Ref Range: 30.0 - 100.0 ng/mL 7.6 (L)     I, Marcille Blanco, CMA, am acting as transcriptionist for Starlyn Skeans, MD I have reviewed the above documentation for accuracy and completeness, and I agree with the above. -Dennard Nip, MD

## 2019-01-15 ENCOUNTER — Ambulatory Visit (INDEPENDENT_AMBULATORY_CARE_PROVIDER_SITE_OTHER): Payer: BC Managed Care – PPO | Admitting: Family Medicine

## 2019-01-15 ENCOUNTER — Other Ambulatory Visit: Payer: Self-pay

## 2019-01-15 ENCOUNTER — Encounter (INDEPENDENT_AMBULATORY_CARE_PROVIDER_SITE_OTHER): Payer: Self-pay | Admitting: Family Medicine

## 2019-01-15 DIAGNOSIS — E559 Vitamin D deficiency, unspecified: Secondary | ICD-10-CM

## 2019-01-15 DIAGNOSIS — F3289 Other specified depressive episodes: Secondary | ICD-10-CM

## 2019-01-15 DIAGNOSIS — R7303 Prediabetes: Secondary | ICD-10-CM

## 2019-01-15 DIAGNOSIS — Z6841 Body Mass Index (BMI) 40.0 and over, adult: Secondary | ICD-10-CM

## 2019-01-15 DIAGNOSIS — E66813 Obesity, class 3: Secondary | ICD-10-CM

## 2019-01-15 MED ORDER — VITAMIN D (ERGOCALCIFEROL) 1.25 MG (50000 UNIT) PO CAPS: 50000.0000 [IU] | ORAL_CAPSULE | ORAL | 0 refills | Status: DC

## 2019-01-15 MED ORDER — BUPROPION HCL ER (SR) 150 MG PO TB12: 150.0000 mg | ORAL_TABLET | Freq: Every day | ORAL | 0 refills | Status: DC

## 2019-01-15 MED ORDER — METFORMIN HCL 500 MG PO TABS: 500.0000 mg | ORAL_TABLET | Freq: Every day | ORAL | 0 refills | Status: DC

## 2019-01-15 NOTE — Progress Notes (Signed)
Office: [phone]  /  Fax: [phone] TeleHealth Visit:  Kim Tanner has verbally consented to this TeleHealth visit today. The patient is located at home, the provider is located at the [facility] office. The participants in this visit include the listed provider and patient. Kim Tanner was unable to use realtime audiovisual technology today and the Telehealth visit was conducted via telephone.  HPI:   Chief Complaint: OBESITY Kim Tanner is here to discuss her progress with her obesity treatment plan. She is on the Category 2 plan and is following her eating plan approximately 50 % of the time. She states she is walking 20 minutes 4 times per week. Kim Tanner feels that she has done well maintaining her weight while working from home. She is struggling with meal planning due to grocery shortages.  We were unable to weigh the patient today for this TeleHealth visit. She feels as if she has maintained weight since her last visit. She has lost 6 lbs since starting treatment with [name].  Vitamin D Deficiency Kim Tanner has a diagnosis of vitamin D deficiency. She is currently stable on vit D, but is not yet at goal. Kim Tanner denies nausea, vomiting, or muscle weakness.  Pre-Diabetes Kim Tanner has a diagnosis of pre-diabetes based on her elevated Hgb A1c and was informed this puts her at greater risk of developing diabetes. She is working on her diet and doing well on metformin to decrease risk of diabetes. She notes that her simple carbs are increased and she is struggling to reduce this. She denies nausea, vomiting, or hypoglycemia.   Depression with emotional eating behaviors Kim Tanner feels the Wellbutrin has helped with mood and decreased emotional eating. She feels more in control. She is struggling with emotional eating and using food for comfort to the extent that it is negatively impacting her health. She often snacks when she is not hungry. She has been working on behavior  modification techniques to help reduce her emotional eating and has been somewhat successful. She shows no sign of suicidal or homicidal ideations.  ASSESSMENT AND PLAN:  Vitamin D deficiency - Plan: Vitamin D, Ergocalciferol, (DRISDOL) 1.25 MG (50000 UT) CAPS capsule  Prediabetes - Plan: metFORMIN (GLUCOPHAGE) 500 MG tablet  Other depression - with emotional eating - Plan: buPROPion (WELLBUTRIN SR) 150 MG 12 hr tablet  Class 3 severe obesity with serious comorbidity and body mass index (BMI) of 45.0 to 49.9 in adult, unspecified obesity type ([place])  PLAN:  Vitamin D Deficiency Kim Tanner was informed that low vitamin D levels contribute to fatigue and are associated with obesity, breast, and colon cancer. Kim Tanner agrees to continue to take prescription Vit D @50 ,000 IU every week #4 with no refills and will follow up for routine testing of vitamin D, at least 2-3 times per year. She was informed of the risk of over-replacement of vitamin D and agrees to not increase her dose unless she discusses this with [name] first. Kim Tanner agrees to follow up in 2 to 3 weeks as directed.  Pre-Diabetes Kim Tanner will continue to work on weight loss, exercise, and decreasing simple carbohydrates in her diet to help decrease the risk of diabetes. She was informed that eating too many simple carbohydrates or too many calories at one sitting increases the likelihood of GI side effects. Kim Tanner agreed to continue metformin 500 mg qAM #30 with no refills and a prescription was written today. Kim Tanner agreed to follow up with [name] as directed to monitor her progress in 2 weeks.  Depression with Emotional Eating Behaviors We discussed behavior modification techniques today to help Kim Tanner deal with her emotional eating and depression. She has agreed to take Wellbutrin SR 150 mg qd #30 with no refills and agreed to follow up as directed.  Obesity Kim Tanner is currently in the action stage of change. As such, her goal is to  continue with weight loss efforts. She has agreed to follow the Category 2 plan. Kim Tanner has been instructed to work up to a goal of 150 minutes of combined cardio and strengthening exercise per week for weight loss and overall health benefits. We discussed the following Behavioral Modification Strategies today: increasing lean protein intake, decreasing simple carbohydrates, increasing vegetables, work on meal planning and easy cooking plans, and emotional eating strategies.  Kim Tanner has agreed to follow up with our clinic in 2 weeks. She was informed of the importance of frequent follow up visits to maximize her success with intensive lifestyle modifications for her multiple health conditions.  ALLERGIES: Allergies  Allergen Reactions  . Compazine [Prochlorperazine Edisylate]     MEDICATIONS: Current Outpatient Medications on File Prior to Visit  Medication Sig Dispense Refill  . albuterol (PROVENTIL HFA;VENTOLIN HFA) 108 (90 Base) MCG/ACT inhaler Inhale 2 puffs into the lungs every 6 (six) hours as needed for wheezing or shortness of breath. 1 Inhaler 1  . Fluticasone-Salmeterol (ADVAIR DISKUS) 100-50 MCG/DOSE AEPB Inhale 1 puff into the lungs 2 (two) times daily. 60 each 1  . hydrochlorothiazide (HYDRODIURIL) 25 MG tablet TAKE 1 TABLET(25 MG) BY MOUTH DAILY 90 tablet 1  . meclizine (ANTIVERT) 25 MG tablet TAKE 1 TABLET(25 MG) BY MOUTH TWICE DAILY AS NEEDED FOR DIZZINESS 30 tablet 0  . methimazole (TAPAZOLE) 5 MG tablet Take 1 tablet (5 mg total) by mouth 2 (two) times daily. 120 tablet 1   No current facility-administered medications on file prior to visit.     PAST MEDICAL HISTORY: Past Medical History:  Diagnosis Date  . Anxiety   . Asthma   . Back pain   . Dyspnea   . HTN (hypertension)   . Joint pain   . Lower extremity edema   . Metabolic syndrome   . Morbid obesity ([place])   . Prediabetes     PAST SURGICAL HISTORY: Past Surgical History:  Procedure Laterality Date   . BREAST REDUCTION SURGERY  2008  . CESAREAN SECTION    . DILATION AND CURETTAGE OF UTERUS    . INDUCED ABORTION    . uterine ablation      SOCIAL HISTORY: Social History   Tobacco Use  . Smoking status: Never Smoker  . Smokeless tobacco: Never Used  Substance Use Topics  . Alcohol use: Yes    Comment: socially   . Drug use: No    FAMILY HISTORY: Family History  Problem Relation Age of Onset  . Hypertension Mother   . Obesity Mother   . Hypertension Father   . Diabetes Father   . Diabetes Sister   . Stroke Sister 31    ROS: Review of Systems  Gastrointestinal: Negative for nausea and vomiting.  Musculoskeletal:       Negative for muscle weakness.  Endo/Heme/Allergies:       Negative for hypoglycemia.  Psychiatric/Behavioral: Positive for depression.    PHYSICAL EXAM: Pt in no acute distress  RECENT LABS AND TESTS: BMET    Component Value Date/Time   NA 142 11/18/2018 0900   K 4.0 11/18/2018 0900   CL 106 11/18/2018 0900  CO2 22 11/18/2018 0900   GLUCOSE 87 11/18/2018 0900   GLUCOSE 114 (H) 08/21/2017 1009   BUN 8 11/18/2018 0900   CREATININE 0.73 11/18/2018 0900   CREATININE 0.77 08/21/2017 1009   CALCIUM 9.1 11/18/2018 0900   GFRNONAA 100 11/18/2018 0900   GFRNONAA 94 08/21/2017 1009   GFRAA 115 11/18/2018 0900   GFRAA 109 08/21/2017 1009   Lab Results  Component Value Date   HGBA1C 5.5 11/18/2018   HGBA1C 5.8 (H) 09/16/2018   HGBA1C 5.5 02/25/2018   HGBA1C 5.8% 11/11/2017   Lab Results  Component Value Date   INSULIN 17.7 11/18/2018   CBC    Component Value Date/Time   WBC 6.2 11/18/2018 0900   WBC 6.4 08/21/2017 1009   RBC 4.13 11/18/2018 0900   RBC 4.23 08/21/2017 1009   HGB 11.6 11/18/2018 0900   HCT 33.5 (L) 11/18/2018 0900   PLT 242 09/16/2018 1650   MCV 81 11/18/2018 0900   MCH 28.1 11/18/2018 0900   MCH 27.9 08/21/2017 1009   MCHC 34.6 11/18/2018 0900   MCHC 33.9 08/21/2017 1009   RDW 13.8 11/18/2018 0900   LYMPHSABS  2.1 11/18/2018 0900   MONOABS 588 12/26/2016 1525   EOSABS 0.1 11/18/2018 0900   BASOSABS 0.0 11/18/2018 0900   Iron/TIBC/Ferritin/ %Sat No results found for: IRON, TIBC, FERRITIN, IRONPCTSAT Lipid Panel     Component Value Date/Time   CHOL 155 11/18/2018 0900   TRIG 88 11/18/2018 0900   HDL 43 11/18/2018 0900   CHOLHDL 4.3 02/25/2018 0934   CHOLHDL 3.6 01/10/2017 0823   VLDL 12 01/10/2017 0823   LDLCALC 94 11/18/2018 0900   Hepatic Function Panel     Component Value Date/Time   PROT 6.1 11/18/2018 0900   ALBUMIN 3.8 11/18/2018 0900   AST 21 11/18/2018 0900   ALT 19 11/18/2018 0900   ALKPHOS 90 11/18/2018 0900   BILITOT 0.3 11/18/2018 0900      Component Value Date/Time   TSH <0.01 (L) 12/19/2018 0854   TSH <0.006 (L) 11/18/2018 0900   TSH <0.006 (L) 09/16/2018 1650    Results for ANISHA, STARLIPER (MRN [id number]) as of 01/15/2019 10:17  Ref. Range 11/18/2018 09:00  Vitamin D, 25-Hydroxy Latest Ref Range: 30.0 - 100.0 ng/mL 7.6 (L)    I, Marcille Blanco, CMA, am acting as transcriptionist for Starlyn Skeans, MD I have reviewed the above documentation for accuracy and completeness, and I agree with the above. -Dennard Nip, MD

## 2019-02-04 ENCOUNTER — Encounter (INDEPENDENT_AMBULATORY_CARE_PROVIDER_SITE_OTHER): Payer: Self-pay | Admitting: Family Medicine

## 2019-02-04 ENCOUNTER — Ambulatory Visit (INDEPENDENT_AMBULATORY_CARE_PROVIDER_SITE_OTHER): Payer: BC Managed Care – PPO | Admitting: Family Medicine

## 2019-02-04 ENCOUNTER — Other Ambulatory Visit: Payer: Self-pay

## 2019-02-04 DIAGNOSIS — Z6841 Body Mass Index (BMI) 40.0 and over, adult: Secondary | ICD-10-CM | POA: Diagnosis not present

## 2019-02-04 DIAGNOSIS — F3289 Other specified depressive episodes: Secondary | ICD-10-CM | POA: Diagnosis not present

## 2019-02-04 MED ORDER — BUPROPION HCL ER (SR) 200 MG PO TB12: 200.0000 mg | ORAL_TABLET | Freq: Every day | ORAL | 0 refills | Status: DC

## 2019-02-04 NOTE — Progress Notes (Signed)
Office: [phone]  /  Fax: [phone] TeleHealth Visit:  Kim Tanner has verbally consented to this TeleHealth visit today. The patient is located at home, the provider is located at the [employer] [facility] office. The participants in this visit include the listed provider and patient. The visit was conducted today via Face Time.  HPI:   Chief Complaint: OBESITY Kim Tanner is here to discuss her progress with her obesity treatment plan. She is on the Category 2 plan and is following her eating plan approximately 50 % of the time. She states she is exercising 0 minutes 0 times per week. Kim Tanner feels that she is doing well maintaining her weight, but she has not been exercising. She has been feeling more drained lately. She hasn't been following her plan closely, but she has been more mindful and is still eating healthier than she used to.  We were unable to weigh the patient today for this TeleHealth visit. She feels as if she has maintained weight since her last visit. She has lost 6 lbs since starting treatment with [name].  Depression with emotional eating behaviors Kim Tanner feels that her mood is not as good. She notes more fatigue, worsening insomnia, feels more achy and bloated. This has worsened in the last month since COVID19 isolation. She has been working on behavior modification techniques to help reduce her emotional eating and has been somewhat successful.   Depression screen Kim Tanner 2/9 11/18/2018 02/25/2018 06/27/2017  Decreased Interest 1 0 0  Down, Depressed, Hopeless 1 0 0  PHQ - 2 Score 2 0 0  Altered sleeping 2 - -  Tired, decreased energy 3 - -  Change in appetite 2 - -  Feeling bad or failure about yourself  0 - -  Trouble concentrating 1 - -  Moving slowly or fidgety/restless 1 - -  Suicidal thoughts 0 - -  PHQ-9 Score 11 - -  Difficult doing work/chores Somewhat difficult - -   ASSESSMENT AND PLAN:  Other depression - with emotional eating - Plan: buPROPion  (WELLBUTRIN SR) 200 MG 12 hr tablet  Class 3 severe obesity with serious comorbidity and body mass index (BMI) of 45.0 to 49.9 in adult, unspecified obesity type ([facility])  PLAN:  Depression with Emotional Eating Behaviors We discussed behavior modification techniques today to help Kim Tanner deal with her emotional eating and depression. She has agreed to increase Wellbutrin SR 200 mg qAM #30 with no refills and agreed to follow up as directed in 2 weeks.  Obesity Kim Tanner is currently in the action stage of change. As such, her goal is to continue with weight loss efforts. She has agreed to follow the Category 2 plan. Kim Tanner has been instructed to work up to a goal of 150 minutes of combined cardio and strengthening exercise per week for weight loss and overall health benefits. We discussed the following Behavioral Modification Strategies today: increasing vegetables  Kim Tanner has agreed to follow up with our clinic in 2 weeks. She was informed of the importance of frequent follow up visits to maximize her success with intensive lifestyle modifications for her multiple health conditions.  ALLERGIES: Allergies  Allergen Reactions  . Compazine [Prochlorperazine Edisylate]     MEDICATIONS: Current Outpatient Medications on File Prior to Visit  Medication Sig Dispense Refill  . albuterol (PROVENTIL HFA;VENTOLIN HFA) 108 (90 Base) MCG/ACT inhaler Inhale 2 puffs into the lungs every 6 (six) hours as needed for wheezing or shortness of breath. 1 Inhaler 1  . Fluticasone-Salmeterol (  ADVAIR DISKUS) 100-50 MCG/DOSE AEPB Inhale 1 puff into the lungs 2 (two) times daily. 60 each 1  . hydrochlorothiazide (HYDRODIURIL) 25 MG tablet TAKE 1 TABLET(25 MG) BY MOUTH DAILY 90 tablet 1  . meclizine (ANTIVERT) 25 MG tablet TAKE 1 TABLET(25 MG) BY MOUTH TWICE DAILY AS NEEDED FOR DIZZINESS 30 tablet 0  . metFORMIN (GLUCOPHAGE) 500 MG tablet Take 1 tablet (500 mg total) by mouth daily with breakfast. 30 tablet 0  .  methimazole (TAPAZOLE) 5 MG tablet Take 1 tablet (5 mg total) by mouth 2 (two) times daily. 120 tablet 1  . Vitamin D, Ergocalciferol, (DRISDOL) 1.25 MG (50000 UT) CAPS capsule Take 1 capsule (50,000 Units total) by mouth every 7 (seven) days. 4 capsule 0   No current facility-administered medications on file prior to visit.     PAST MEDICAL HISTORY: Past Medical History:  Diagnosis Date  . Anxiety   . Asthma   . Back pain   . Dyspnea   . HTN (hypertension)   . Joint pain   . Lower extremity edema   . Metabolic syndrome   . Morbid obesity ([place])   . Prediabetes     PAST SURGICAL HISTORY: Past Surgical History:  Procedure Laterality Date  . BREAST REDUCTION SURGERY  2008  . CESAREAN SECTION    . DILATION AND CURETTAGE OF UTERUS    . INDUCED ABORTION    . uterine ablation      SOCIAL HISTORY: Social History   Tobacco Use  . Smoking status: Never Smoker  . Smokeless tobacco: Never Used  Substance Use Topics  . Alcohol use: Yes    Comment: socially   . Drug use: No    FAMILY HISTORY: Family History  Problem Relation Age of Onset  . Hypertension Mother   . Obesity Mother   . Hypertension Father   . Diabetes Father   . Diabetes Sister   . Stroke Sister 47    ROS: Review of Systems  Constitutional: Positive for malaise/fatigue.  Psychiatric/Behavioral: The patient has insomnia.     PHYSICAL EXAM: Pt in no acute distress  RECENT LABS AND TESTS: BMET    Component Value Date/Time   NA 142 11/18/2018 0900   K 4.0 11/18/2018 0900   CL 106 11/18/2018 0900   CO2 22 11/18/2018 0900   GLUCOSE 87 11/18/2018 0900   GLUCOSE 114 (H) 08/21/2017 1009   BUN 8 11/18/2018 0900   CREATININE 0.73 11/18/2018 0900   CREATININE 0.77 08/21/2017 1009   CALCIUM 9.1 11/18/2018 0900   GFRNONAA 100 11/18/2018 0900   GFRNONAA 94 08/21/2017 1009   GFRAA 115 11/18/2018 0900   GFRAA 109 08/21/2017 1009   Lab Results  Component Value Date   HGBA1C 5.5 11/18/2018   HGBA1C  5.8 (H) 09/16/2018   HGBA1C 5.5 02/25/2018   HGBA1C 5.8% 11/11/2017   Lab Results  Component Value Date   INSULIN 17.7 11/18/2018   CBC    Component Value Date/Time   WBC 6.2 11/18/2018 0900   WBC 6.4 08/21/2017 1009   RBC 4.13 11/18/2018 0900   RBC 4.23 08/21/2017 1009   HGB 11.6 11/18/2018 0900   HCT 33.5 (L) 11/18/2018 0900   PLT 242 09/16/2018 1650   MCV 81 11/18/2018 0900   MCH 28.1 11/18/2018 0900   MCH 27.9 08/21/2017 1009   MCHC 34.6 11/18/2018 0900   MCHC 33.9 08/21/2017 1009   RDW 13.8 11/18/2018 0900   LYMPHSABS 2.1 11/18/2018 0900   MONOABS 588  12/26/2016 1525   EOSABS 0.1 11/18/2018 0900   BASOSABS 0.0 11/18/2018 0900   Iron/TIBC/Ferritin/ %Sat No results found for: IRON, TIBC, FERRITIN, IRONPCTSAT Lipid Panel     Component Value Date/Time   CHOL 155 11/18/2018 0900   TRIG 88 11/18/2018 0900   HDL 43 11/18/2018 0900   CHOLHDL 4.3 02/25/2018 0934   CHOLHDL 3.6 01/10/2017 0823   VLDL 12 01/10/2017 0823   LDLCALC 94 11/18/2018 0900   Hepatic Function Panel     Component Value Date/Time   PROT 6.1 11/18/2018 0900   ALBUMIN 3.8 11/18/2018 0900   AST 21 11/18/2018 0900   ALT 19 11/18/2018 0900   ALKPHOS 90 11/18/2018 0900   BILITOT 0.3 11/18/2018 0900      Component Value Date/Time   TSH <0.01 (L) 12/19/2018 0854   TSH <0.006 (L) 11/18/2018 0900   TSH <0.006 (L) 09/16/2018 1650   Results for YOLANDE, SKODA (MRN [id number]) as of 02/04/2019 14:52  Ref. Range 11/18/2018 09:00  Vitamin D, 25-Hydroxy Latest Ref Range: 30.0 - 100.0 ng/mL 7.6 (L)    I, Marcille Blanco, CMA, am acting as transcriptionist for Starlyn Skeans, MD I have reviewed the above documentation for accuracy and completeness, and I agree with the above. -Dennard Nip, MD

## 2019-02-18 ENCOUNTER — Encounter (INDEPENDENT_AMBULATORY_CARE_PROVIDER_SITE_OTHER): Payer: Self-pay | Admitting: Family Medicine

## 2019-02-18 ENCOUNTER — Ambulatory Visit (INDEPENDENT_AMBULATORY_CARE_PROVIDER_SITE_OTHER): Payer: BC Managed Care – PPO | Admitting: Family Medicine

## 2019-02-18 ENCOUNTER — Other Ambulatory Visit: Payer: Self-pay

## 2019-02-18 DIAGNOSIS — Z6841 Body Mass Index (BMI) 40.0 and over, adult: Secondary | ICD-10-CM

## 2019-02-18 DIAGNOSIS — E559 Vitamin D deficiency, unspecified: Secondary | ICD-10-CM | POA: Diagnosis not present

## 2019-02-18 DIAGNOSIS — R7303 Prediabetes: Secondary | ICD-10-CM | POA: Diagnosis not present

## 2019-02-18 MED ORDER — VITAMIN D (ERGOCALCIFEROL) 1.25 MG (50000 UNIT) PO CAPS: 50000.0000 [IU] | ORAL_CAPSULE | ORAL | 0 refills | Status: DC

## 2019-02-19 NOTE — Progress Notes (Signed)
Office: [phone]  /  Fax: [phone] TeleHealth Visit:  Kim Tanner has verbally consented to this TeleHealth visit today. The patient is located at home, the provider is located at the [facility] office. The participants in this visit include the listed provider and patient. The visit was conducted today via Face Time.  HPI:   Chief Complaint: OBESITY Kim Tanner is here to discuss her progress with her obesity treatment plan. She is on the Category 2 plan and is following her eating plan approximately 50 % of the time. She states she is walking and using YouTube videos 15 to 30 minutes 2 times per week. Kim Tanner has done well maintaining her weight. She is deviating from her plan more as she is looking for more creative meals for her family and the calories are starting to creep up.  We were unable to weigh the patient today for this TeleHealth visit. She has not weighed since her last visit. She has lost 6 lbs since starting treatment with [name].  Vitamin D Deficiency Kim Tanner has a diagnosis of vitamin D deficiency. She is currently stable on vit D, but is not yet at goal. Kim Tanner denies nausea, vomiting, or muscle weakness.  Pre-Diabetes Kim Tanner has a diagnosis of pre-diabetes based on her elevated Hgb A1c and was informed this puts her at greater risk of developing diabetes. Her metformin was decreased to half a pill every morning, but she still has mild GI upset. She is working on diet and exercise to decrease risk of diabetes.   ASSESSMENT AND PLAN:  Vitamin D deficiency - Plan: Vitamin D, Ergocalciferol, (DRISDOL) 1.25 MG (50000 UT) CAPS capsule  Prediabetes  Class 3 severe obesity with serious comorbidity and body mass index (BMI) of 45.0 to 49.9 in adult, unspecified obesity type ([place])  PLAN:  Vitamin D Deficiency Kim Tanner was informed that low vitamin D levels contribute to fatigue and are associated with obesity, breast, and colon cancer. Kim Tanner agrees  to continue to take prescription Vit D @50 ,000 IU every week #4 with no refills and will follow up for routine testing of vitamin D, at least 2-3 times per year. She was informed of the risk of over-replacement of vitamin D and agrees to not increase her dose unless she discusses this with [name] first. Kim Tanner agrees to follow up in 2 weeks as directed.  Pre-Diabetes Kim Tanner will continue to work on weight loss, exercise, and decreasing simple carbohydrates in her diet to help decrease the risk of diabetes. She was informed that eating too many simple carbohydrates or too many calories at one sitting increases the likelihood of GI side effects. Kim Tanner agreed to change metformin to half the dose (250 mg) qhs with food. If there is no improvement, we will discontinue metformin. Kim Tanner agreed to follow up with [name] as directed to monitor her progress in 2 weeks.   Obesity Kim Tanner is currently in the action stage of change. As such, her goal is to continue with weight loss efforts. She has agreed to follow the Category 2 plan. Kim Tanner has been instructed to work up to a goal of 150 minutes of combined cardio and strengthening exercise per week for weight loss and overall health benefits. We discussed the following Behavioral Modification Strategies today: work on meal planning and easy cooking plans, better snacking choices, and dealing with family or coworker sabotage.  Kim Tanner has agreed to follow up with our clinic in 2 weeks. She was informed of the importance of frequent follow up  visits to maximize her success with intensive lifestyle modifications for her multiple health conditions.  ALLERGIES: Allergies  Allergen Reactions  . Compazine [Prochlorperazine Edisylate]     MEDICATIONS: Current Outpatient Medications on File Prior to Visit  Medication Sig Dispense Refill  . albuterol (PROVENTIL HFA;VENTOLIN HFA) 108 (90 Base) MCG/ACT inhaler Inhale 2 puffs into the lungs every 6 (six) hours as  needed for wheezing or shortness of breath. 1 Inhaler 1  . buPROPion (WELLBUTRIN SR) 200 MG 12 hr tablet Take 1 tablet (200 mg total) by mouth daily. 30 tablet 0  . Fluticasone-Salmeterol (ADVAIR DISKUS) 100-50 MCG/DOSE AEPB Inhale 1 puff into the lungs 2 (two) times daily. 60 each 1  . hydrochlorothiazide (HYDRODIURIL) 25 MG tablet TAKE 1 TABLET(25 MG) BY MOUTH DAILY 90 tablet 1  . meclizine (ANTIVERT) 25 MG tablet TAKE 1 TABLET(25 MG) BY MOUTH TWICE DAILY AS NEEDED FOR DIZZINESS 30 tablet 0  . metFORMIN (GLUCOPHAGE) 500 MG tablet Take 1 tablet (500 mg total) by mouth daily with breakfast. 30 tablet 0  . methimazole (TAPAZOLE) 5 MG tablet Take 1 tablet (5 mg total) by mouth 2 (two) times daily. 120 tablet 1   No current facility-administered medications on file prior to visit.     PAST MEDICAL HISTORY: Past Medical History:  Diagnosis Date  . Anxiety   . Asthma   . Back pain   . Dyspnea   . HTN (hypertension)   . Joint pain   . Lower extremity edema   . Metabolic syndrome   . Morbid obesity ([place])   . Prediabetes     PAST SURGICAL HISTORY: Past Surgical History:  Procedure Laterality Date  . BREAST REDUCTION SURGERY  2008  . CESAREAN SECTION    . DILATION AND CURETTAGE OF UTERUS    . INDUCED ABORTION    . uterine ablation      SOCIAL HISTORY: Social History   Tobacco Use  . Smoking status: Never Smoker  . Smokeless tobacco: Never Used  Substance Use Topics  . Alcohol use: Yes    Comment: socially   . Drug use: No    FAMILY HISTORY: Family History  Problem Relation Age of Onset  . Hypertension Mother   . Obesity Mother   . Hypertension Father   . Diabetes Father   . Diabetes Sister   . Stroke Sister 48    ROS: Review of Systems  Gastrointestinal: Negative for nausea and vomiting.  Musculoskeletal:       Negative for muscle weakness.    PHYSICAL EXAM: Pt in no acute distress  RECENT LABS AND TESTS: BMET    Component Value Date/Time   NA 142  11/18/2018 0900   K 4.0 11/18/2018 0900   CL 106 11/18/2018 0900   CO2 22 11/18/2018 0900   GLUCOSE 87 11/18/2018 0900   GLUCOSE 114 (H) 08/21/2017 1009   BUN 8 11/18/2018 0900   CREATININE 0.73 11/18/2018 0900   CREATININE 0.77 08/21/2017 1009   CALCIUM 9.1 11/18/2018 0900   GFRNONAA 100 11/18/2018 0900   GFRNONAA 94 08/21/2017 1009   GFRAA 115 11/18/2018 0900   GFRAA 109 08/21/2017 1009   Lab Results  Component Value Date   HGBA1C 5.5 11/18/2018   HGBA1C 5.8 (H) 09/16/2018   HGBA1C 5.5 02/25/2018   HGBA1C 5.8% 11/11/2017   Lab Results  Component Value Date   INSULIN 17.7 11/18/2018   CBC    Component Value Date/Time   WBC 6.2 11/18/2018 0900   WBC  6.4 08/21/2017 1009   RBC 4.13 11/18/2018 0900   RBC 4.23 08/21/2017 1009   HGB 11.6 11/18/2018 0900   HCT 33.5 (L) 11/18/2018 0900   PLT 242 09/16/2018 1650   MCV 81 11/18/2018 0900   MCH 28.1 11/18/2018 0900   MCH 27.9 08/21/2017 1009   MCHC 34.6 11/18/2018 0900   MCHC 33.9 08/21/2017 1009   RDW 13.8 11/18/2018 0900   LYMPHSABS 2.1 11/18/2018 0900   MONOABS 588 12/26/2016 1525   EOSABS 0.1 11/18/2018 0900   BASOSABS 0.0 11/18/2018 0900   Iron/TIBC/Ferritin/ %Sat No results found for: IRON, TIBC, FERRITIN, IRONPCTSAT Lipid Panel     Component Value Date/Time   CHOL 155 11/18/2018 0900   TRIG 88 11/18/2018 0900   HDL 43 11/18/2018 0900   CHOLHDL 4.3 02/25/2018 0934   CHOLHDL 3.6 01/10/2017 0823   VLDL 12 01/10/2017 0823   LDLCALC 94 11/18/2018 0900   Hepatic Function Panel     Component Value Date/Time   PROT 6.1 11/18/2018 0900   ALBUMIN 3.8 11/18/2018 0900   AST 21 11/18/2018 0900   ALT 19 11/18/2018 0900   ALKPHOS 90 11/18/2018 0900   BILITOT 0.3 11/18/2018 0900      Component Value Date/Time   TSH <0.01 (L) 12/19/2018 0854   TSH <0.006 (L) 11/18/2018 0900   TSH <0.006 (L) 09/16/2018 1650   Results for CIANA, SIMMON (MRN [id number]) as of 02/19/2019 12:23  Ref. Range 11/18/2018 09:00   Vitamin D, 25-Hydroxy Latest Ref Range: 30.0 - 100.0 ng/mL 7.6 (L)     I, Marcille Blanco, CMA, am acting as transcriptionist for Starlyn Skeans, MD I have reviewed the above documentation for accuracy and completeness, and I agree with the above. -Dennard Nip, MD

## 2019-03-05 ENCOUNTER — Other Ambulatory Visit: Payer: Self-pay

## 2019-03-05 ENCOUNTER — Ambulatory Visit (INDEPENDENT_AMBULATORY_CARE_PROVIDER_SITE_OTHER): Payer: BC Managed Care – PPO | Admitting: Family Medicine

## 2019-03-05 ENCOUNTER — Encounter (INDEPENDENT_AMBULATORY_CARE_PROVIDER_SITE_OTHER): Payer: Self-pay | Admitting: Family Medicine

## 2019-03-05 DIAGNOSIS — Z6841 Body Mass Index (BMI) 40.0 and over, adult: Secondary | ICD-10-CM

## 2019-03-05 DIAGNOSIS — I10 Essential (primary) hypertension: Secondary | ICD-10-CM | POA: Diagnosis not present

## 2019-03-05 DIAGNOSIS — F3289 Other specified depressive episodes: Secondary | ICD-10-CM | POA: Diagnosis not present

## 2019-03-05 MED ORDER — BUPROPION HCL ER (SR) 200 MG PO TB12: 200.0000 mg | ORAL_TABLET | Freq: Every day | ORAL | 0 refills | Status: DC

## 2019-03-05 NOTE — Progress Notes (Signed)
Office: [phone]  /  Fax: [phone] TeleHealth Visit:  Kim Tanner has verbally consented to this TeleHealth visit today. The patient is located at home, the provider is located at the [employer] [facility] office. The participants in this visit include the listed provider and patient. The visit was conducted today via Face Time.  HPI:   Chief Complaint: OBESITY Kim Tanner is here to discuss her progress with her obesity treatment plan. She is on the Category 2 plan and is following her eating plan approximately 70 % of the time. She states she is walking 30 minutes 4 times per week. Norena feels that she is doing better with her eating plan. Her mood has improved and so had her energy and activity level. She is working on being more creative with her food choices and is meal planning a bit more.  We were unable to weigh the patient today for this TeleHealth visit. She feels as if she has lost weight since her last visit. She has lost 6 lbs since starting treatment with [name].  Hypertension Kim Tanner is a 48 y.o. female with hypertension. [name]'s blood pressure has currently been well controlled on HCTZ with diet and exercise. She is working on weight loss to help control her blood pressure with the goal of decreasing her risk of heart attack and stroke. Letetia denies chest pain or headache.  Depression with emotional eating behaviors Kim Tanner's mood has improved on Wellbutrin and feels that she is in more control of her emotional eating and using food for comfort to the extent that it is negatively impacting her health. She often snacks when she is not hungry. Her fatigue has improved and she denies insomnia and palpitations. She has been working on behavior modification techniques to help reduce her emotional eating and has been somewhat successful.   Depression screen [facility] 2/9 11/18/2018 02/25/2018 06/27/2017  Decreased Interest 1 0 0  Down, Depressed, Hopeless 1 0 0  PHQ  - 2 Score 2 0 0  Altered sleeping 2 - -  Tired, decreased energy 3 - -  Change in appetite 2 - -  Feeling bad or failure about yourself  0 - -  Trouble concentrating 1 - -  Moving slowly or fidgety/restless 1 - -  Suicidal thoughts 0 - -  PHQ-9 Score 11 - -  Difficult doing work/chores Somewhat difficult - -   ASSESSMENT AND PLAN:  Essential hypertension  Other depression - with emotional eating - Plan: buPROPion (WELLBUTRIN SR) 200 MG 12 hr tablet  Class 3 severe obesity with serious comorbidity and body mass index (BMI) of 45.0 to 49.9 in adult, unspecified obesity type ([facility])  PLAN:  Hypertension We discussed sodium restriction, working on healthy weight loss, and a regular exercise program as the means to achieve improved blood pressure control. We will continue to monitor her blood pressure as well as her progress with the above lifestyle modifications. She will continue her diet and HCTZ as prescribed and will watch for signs of hypotension as she continues her lifestyle modifications. We will check her blood pressure when she comes back into the office. [name] agreed with this plan and agreed to follow up as directed in 2 weeks.  Depression with Emotional Eating Behaviors We discussed behavior modification techniques today to help Kim Tanner deal with her emotional eating and depression. She has agreed to take Wellbutrin SR 100 mg qd #30 with no refills and agreed to follow up as directed.  Obesity Kim Tanner is currently  in the action stage of change. As such, her goal is to continue with weight loss efforts. She has agreed to follow the Category 2 plan. Kim Tanner has been instructed to work up to a goal of 150 minutes of combined cardio and strengthening exercise per week for weight loss and overall health benefits. We discussed the following Behavioral Modification Strategies today: increasing lean protein intake, work on meal planning and easy cooking plans, and ways to avoid  boredom eating.  Kim Tanner has agreed to follow up with our clinic in 2 weeks. She was informed of the importance of frequent follow up visits to maximize her success with intensive lifestyle modifications for her multiple health conditions.  ALLERGIES: Allergies  Allergen Reactions  . Compazine [Prochlorperazine Edisylate]     MEDICATIONS: Current Outpatient Medications on File Prior to Visit  Medication Sig Dispense Refill  . albuterol (PROVENTIL HFA;VENTOLIN HFA) 108 (90 Base) MCG/ACT inhaler Inhale 2 puffs into the lungs every 6 (six) hours as needed for wheezing or shortness of breath. 1 Inhaler 1  . buPROPion (WELLBUTRIN SR) 200 MG 12 hr tablet Take 1 tablet (200 mg total) by mouth daily. 30 tablet 0  . Fluticasone-Salmeterol (ADVAIR DISKUS) 100-50 MCG/DOSE AEPB Inhale 1 puff into the lungs 2 (two) times daily. 60 each 1  . hydrochlorothiazide (HYDRODIURIL) 25 MG tablet TAKE 1 TABLET(25 MG) BY MOUTH DAILY 90 tablet 1  . meclizine (ANTIVERT) 25 MG tablet TAKE 1 TABLET(25 MG) BY MOUTH TWICE DAILY AS NEEDED FOR DIZZINESS 30 tablet 0  . metFORMIN (GLUCOPHAGE) 500 MG tablet Take 1 tablet (500 mg total) by mouth daily with breakfast. 30 tablet 0  . methimazole (TAPAZOLE) 5 MG tablet Take 1 tablet (5 mg total) by mouth 2 (two) times daily. 120 tablet 1  . Vitamin D, Ergocalciferol, (DRISDOL) 1.25 MG (50000 UT) CAPS capsule Take 1 capsule (50,000 Units total) by mouth every 7 (seven) days. 4 capsule 0   No current facility-administered medications on file prior to visit.     PAST MEDICAL HISTORY: Past Medical History:  Diagnosis Date  . Anxiety   . Asthma   . Back pain   . Dyspnea   . HTN (hypertension)   . Joint pain   . Lower extremity edema   . Metabolic syndrome   . Morbid obesity ([place])   . Prediabetes     PAST SURGICAL HISTORY: Past Surgical History:  Procedure Laterality Date  . BREAST REDUCTION SURGERY  2008  . CESAREAN SECTION    . DILATION AND CURETTAGE OF UTERUS     . INDUCED ABORTION    . uterine ablation      SOCIAL HISTORY: Social History   Tobacco Use  . Smoking status: Never Smoker  . Smokeless tobacco: Never Used  Substance Use Topics  . Alcohol use: Yes    Comment: socially   . Drug use: No    FAMILY HISTORY: Family History  Problem Relation Age of Onset  . Hypertension Mother   . Obesity Mother   . Hypertension Father   . Diabetes Father   . Diabetes Sister   . Stroke Sister 59    ROS: Review of Systems  Constitutional: Positive for malaise/fatigue.  Cardiovascular: Negative for chest pain and palpitations.  Neurological: Negative for headaches.  Psychiatric/Behavioral: Positive for depression. The patient does not have insomnia.     PHYSICAL EXAM: Pt in no acute distress  RECENT LABS AND TESTS: BMET    Component Value Date/Time   NA 142  11/18/2018 0900   K 4.0 11/18/2018 0900   CL 106 11/18/2018 0900   CO2 22 11/18/2018 0900   GLUCOSE 87 11/18/2018 0900   GLUCOSE 114 (H) 08/21/2017 1009   BUN 8 11/18/2018 0900   CREATININE 0.73 11/18/2018 0900   CREATININE 0.77 08/21/2017 1009   CALCIUM 9.1 11/18/2018 0900   GFRNONAA 100 11/18/2018 0900   GFRNONAA 94 08/21/2017 1009   GFRAA 115 11/18/2018 0900   GFRAA 109 08/21/2017 1009   Lab Results  Component Value Date   HGBA1C 5.5 11/18/2018   HGBA1C 5.8 (H) 09/16/2018   HGBA1C 5.5 02/25/2018   HGBA1C 5.8% 11/11/2017   Lab Results  Component Value Date   INSULIN 17.7 11/18/2018   CBC    Component Value Date/Time   WBC 6.2 11/18/2018 0900   WBC 6.4 08/21/2017 1009   RBC 4.13 11/18/2018 0900   RBC 4.23 08/21/2017 1009   HGB 11.6 11/18/2018 0900   HCT 33.5 (L) 11/18/2018 0900   PLT 242 09/16/2018 1650   MCV 81 11/18/2018 0900   MCH 28.1 11/18/2018 0900   MCH 27.9 08/21/2017 1009   MCHC 34.6 11/18/2018 0900   MCHC 33.9 08/21/2017 1009   RDW 13.8 11/18/2018 0900   LYMPHSABS 2.1 11/18/2018 0900   MONOABS 588 12/26/2016 1525   EOSABS 0.1 11/18/2018  0900   BASOSABS 0.0 11/18/2018 0900   Iron/TIBC/Ferritin/ %Sat No results found for: IRON, TIBC, FERRITIN, IRONPCTSAT Lipid Panel     Component Value Date/Time   CHOL 155 11/18/2018 0900   TRIG 88 11/18/2018 0900   HDL 43 11/18/2018 0900   CHOLHDL 4.3 02/25/2018 0934   CHOLHDL 3.6 01/10/2017 0823   VLDL 12 01/10/2017 0823   LDLCALC 94 11/18/2018 0900   Hepatic Function Panel     Component Value Date/Time   PROT 6.1 11/18/2018 0900   ALBUMIN 3.8 11/18/2018 0900   AST 21 11/18/2018 0900   ALT 19 11/18/2018 0900   ALKPHOS 90 11/18/2018 0900   BILITOT 0.3 11/18/2018 0900      Component Value Date/Time   TSH <0.01 (L) 12/19/2018 0854   TSH <0.006 (L) 11/18/2018 0900   TSH <0.006 (L) 09/16/2018 1650   Results for ALVIE, SPELTZ (MRN [id number]) as of 03/05/2019 11:17  Ref. Range 11/18/2018 09:00  Vitamin D, 25-Hydroxy Latest Ref Range: 30.0 - 100.0 ng/mL 7.6 (L)     I, Marcille Blanco, CMA, am acting as transcriptionist for Starlyn Skeans, MD I have reviewed the above documentation for accuracy and completeness, and I agree with the above. -Dennard Nip, MD

## 2019-03-19 ENCOUNTER — Encounter (INDEPENDENT_AMBULATORY_CARE_PROVIDER_SITE_OTHER): Payer: Self-pay | Admitting: Family Medicine

## 2019-03-19 ENCOUNTER — Ambulatory Visit (INDEPENDENT_AMBULATORY_CARE_PROVIDER_SITE_OTHER): Payer: BC Managed Care – PPO | Admitting: Family Medicine

## 2019-03-19 ENCOUNTER — Other Ambulatory Visit: Payer: Self-pay

## 2019-03-19 DIAGNOSIS — E559 Vitamin D deficiency, unspecified: Secondary | ICD-10-CM

## 2019-03-19 DIAGNOSIS — Z6841 Body Mass Index (BMI) 40.0 and over, adult: Secondary | ICD-10-CM | POA: Diagnosis not present

## 2019-03-19 MED ORDER — VITAMIN D (ERGOCALCIFEROL) 1.25 MG (50000 UNIT) PO CAPS: 50000.0000 [IU] | ORAL_CAPSULE | ORAL | 0 refills | Status: DC

## 2019-03-23 NOTE — Progress Notes (Signed)
Office: [phone]  /  Fax: [phone] TeleHealth Visit:  Kim Tanner has verbally consented to this TeleHealth visit today. The patient is located at home, the provider is located at the [employer] [facility] office. The participants in this visit include the listed provider and patient. The visit was conducted today via face time.  HPI:   Chief Complaint: OBESITY Kim Tanner is here to discuss her progress with her obesity treatment plan. She is on the Category 2 plan and is following her eating plan approximately 80 % of the time. She states she is walking and exercising with zumba videos for 30 minutes 4 times per week. Adiya feels she is doing better with weight loss and her eating plan. She is struggling with eating her breakfast some mornings due to decreased hunger, and less time to meal prep.  We were unable to weigh the patient today for this TeleHealth visit. She feels as if she has lost weight since her last visit. She has lost 6 lbs since starting treatment with [name].  Vitamin D Deficiency Kim Tanner has a diagnosis of vitamin D deficiency. She is stable on prescription Vit D, but level is not yet at goal. She denies nausea, vomiting or muscle weakness, and notes fatigue is improving.  ASSESSMENT AND PLAN:  Vitamin D deficiency - Plan: Vitamin D, Ergocalciferol, (DRISDOL) 1.25 MG (50000 UT) CAPS capsule  Class 3 severe obesity with serious comorbidity and body mass index (BMI) of 45.0 to 49.9 in adult, unspecified obesity type (Kim Tanner)  PLAN:  Vitamin D Deficiency Kim Tanner was informed that low vitamin D levels contributes to fatigue and are associated with obesity, breast, and colon cancer. Sweet agrees to continue taking prescription Vit D 50,000 IU every week #4 and we will refill for 1 month. She will follow up for routine testing of vitamin D, at least 2-3 times per year. She was informed of the risk of over-replacement of vitamin D and agrees to not increase her  dose unless she discusses this with [name] first. Kim Tanner agrees to follow up with our clinic in 2 weeks.  Obesity Kim Tanner is currently in the action stage of change. As such, her goal is to continue with weight loss efforts She has agreed to follow the Category 2 plan with breakfast options Kim Tanner has been instructed to work up to a goal of 150 minutes of combined cardio and strengthening exercise per week for weight loss and overall health benefits. We discussed the following Behavioral Modification Strategies today: no skipping meals   Kim Tanner has agreed to follow up with our clinic in 2 weeks. She was informed of the importance of frequent follow up visits to maximize her success with intensive lifestyle modifications for her multiple health conditions.  ALLERGIES: Allergies  Allergen Reactions  . Compazine [Prochlorperazine Edisylate]     MEDICATIONS: Current Outpatient Medications on File Prior to Visit  Medication Sig Dispense Refill  . albuterol (PROVENTIL HFA;VENTOLIN HFA) 108 (90 Base) MCG/ACT inhaler Inhale 2 puffs into the lungs every 6 (six) hours as needed for wheezing or shortness of breath. 1 Inhaler 1  . buPROPion (WELLBUTRIN SR) 200 MG 12 hr tablet Take 1 tablet (200 mg total) by mouth daily. 30 tablet 0  . Fluticasone-Salmeterol (ADVAIR DISKUS) 100-50 MCG/DOSE AEPB Inhale 1 puff into the lungs 2 (two) times daily. 60 each 1  . hydrochlorothiazide (HYDRODIURIL) 25 MG tablet TAKE 1 TABLET(25 MG) BY MOUTH DAILY 90 tablet 1  . meclizine (ANTIVERT) 25 MG tablet TAKE 1  TABLET(25 MG) BY MOUTH TWICE DAILY AS NEEDED FOR DIZZINESS 30 tablet 0  . metFORMIN (GLUCOPHAGE) 500 MG tablet Take 1 tablet (500 mg total) by mouth daily with breakfast. 30 tablet 0  . methimazole (TAPAZOLE) 5 MG tablet Take 1 tablet (5 mg total) by mouth 2 (two) times daily. 120 tablet 1   No current facility-administered medications on file prior to visit.     PAST MEDICAL HISTORY: Past Medical History:   Diagnosis Date  . Anxiety   . Asthma   . Back pain   . Dyspnea   . HTN (hypertension)   . Joint pain   . Lower extremity edema   . Metabolic syndrome   . Morbid obesity ([place])   . Prediabetes     PAST SURGICAL HISTORY: Past Surgical History:  Procedure Laterality Date  . BREAST REDUCTION SURGERY  2008  . CESAREAN SECTION    . DILATION AND CURETTAGE OF UTERUS    . INDUCED ABORTION    . uterine ablation      SOCIAL HISTORY: Social History   Tobacco Use  . Smoking status: Never Smoker  . Smokeless tobacco: Never Used  Substance Use Topics  . Alcohol use: Yes    Comment: socially   . Drug use: No    FAMILY HISTORY: Family History  Problem Relation Age of Onset  . Hypertension Mother   . Obesity Mother   . Hypertension Father   . Diabetes Father   . Diabetes Sister   . Stroke Sister 55    ROS: Review of Systems  Constitutional: Positive for malaise/fatigue and weight loss.  Gastrointestinal: Negative for nausea and vomiting.  Musculoskeletal:       Negative muscle weakness    PHYSICAL EXAM: Pt in no acute distress  RECENT LABS AND TESTS: BMET    Component Value Date/Time   NA 142 11/18/2018 0900   K 4.0 11/18/2018 0900   CL 106 11/18/2018 0900   CO2 22 11/18/2018 0900   GLUCOSE 87 11/18/2018 0900   GLUCOSE 114 (H) 08/21/2017 1009   BUN 8 11/18/2018 0900   CREATININE 0.73 11/18/2018 0900   CREATININE 0.77 08/21/2017 1009   CALCIUM 9.1 11/18/2018 0900   GFRNONAA 100 11/18/2018 0900   GFRNONAA 94 08/21/2017 1009   GFRAA 115 11/18/2018 0900   GFRAA 109 08/21/2017 1009   Lab Results  Component Value Date   HGBA1C 5.5 11/18/2018   HGBA1C 5.8 (H) 09/16/2018   HGBA1C 5.5 02/25/2018   HGBA1C 5.8% 11/11/2017   Lab Results  Component Value Date   INSULIN 17.7 11/18/2018   CBC    Component Value Date/Time   WBC 6.2 11/18/2018 0900   WBC 6.4 08/21/2017 1009   RBC 4.13 11/18/2018 0900   RBC 4.23 08/21/2017 1009   HGB 11.6 11/18/2018 0900    HCT 33.5 (L) 11/18/2018 0900   PLT 242 09/16/2018 1650   MCV 81 11/18/2018 0900   MCH 28.1 11/18/2018 0900   MCH 27.9 08/21/2017 1009   MCHC 34.6 11/18/2018 0900   MCHC 33.9 08/21/2017 1009   RDW 13.8 11/18/2018 0900   LYMPHSABS 2.1 11/18/2018 0900   MONOABS 588 12/26/2016 1525   EOSABS 0.1 11/18/2018 0900   BASOSABS 0.0 11/18/2018 0900   Iron/TIBC/Ferritin/ %Sat No results found for: IRON, TIBC, FERRITIN, IRONPCTSAT Lipid Panel     Component Value Date/Time   CHOL 155 11/18/2018 0900   TRIG 88 11/18/2018 0900   HDL 43 11/18/2018 0900   CHOLHDL 4.3  02/25/2018 0934   CHOLHDL 3.6 01/10/2017 0823   VLDL 12 01/10/2017 0823   LDLCALC 94 11/18/2018 0900   Hepatic Function Panel     Component Value Date/Time   PROT 6.1 11/18/2018 0900   ALBUMIN 3.8 11/18/2018 0900   AST 21 11/18/2018 0900   ALT 19 11/18/2018 0900   ALKPHOS 90 11/18/2018 0900   BILITOT 0.3 11/18/2018 0900      Component Value Date/Time   TSH <0.01 (L) 12/19/2018 0854   TSH <0.006 (L) 11/18/2018 0900   TSH <0.006 (L) 09/16/2018 1650      I, Trixie Dredge, am acting as transcriptionist for Dennard Nip, MD I have reviewed the above documentation for accuracy and completeness, and I agree with the above. -Dennard Nip, MD

## 2019-04-06 ENCOUNTER — Encounter (INDEPENDENT_AMBULATORY_CARE_PROVIDER_SITE_OTHER): Payer: Self-pay | Admitting: Family Medicine

## 2019-04-07 ENCOUNTER — Ambulatory Visit (INDEPENDENT_AMBULATORY_CARE_PROVIDER_SITE_OTHER): Payer: BC Managed Care – PPO | Admitting: Physician Assistant

## 2019-04-16 ENCOUNTER — Other Ambulatory Visit: Payer: Self-pay

## 2019-04-16 ENCOUNTER — Encounter (INDEPENDENT_AMBULATORY_CARE_PROVIDER_SITE_OTHER): Payer: Self-pay | Admitting: Family Medicine

## 2019-04-16 ENCOUNTER — Telehealth (INDEPENDENT_AMBULATORY_CARE_PROVIDER_SITE_OTHER): Payer: BC Managed Care – PPO | Admitting: Family Medicine

## 2019-04-16 DIAGNOSIS — R7303 Prediabetes: Secondary | ICD-10-CM

## 2019-04-16 DIAGNOSIS — E559 Vitamin D deficiency, unspecified: Secondary | ICD-10-CM

## 2019-04-16 DIAGNOSIS — Z6841 Body Mass Index (BMI) 40.0 and over, adult: Secondary | ICD-10-CM | POA: Diagnosis not present

## 2019-04-20 ENCOUNTER — Ambulatory Visit: Payer: BC Managed Care – PPO | Admitting: Internal Medicine

## 2019-04-21 ENCOUNTER — Encounter: Payer: BC Managed Care – PPO | Admitting: Family Medicine

## 2019-04-21 NOTE — Progress Notes (Signed)
Office: [phone]  /  Fax: [phone] TeleHealth Visit:  Kim Tanner has verbally consented to this TeleHealth visit today. The patient is located at home, the provider is located at the [employer] [facility] office. The participants in this visit include the listed provider and patient. The visit was conducted today via FaceTime.  HPI:   Chief Complaint: OBESITY Kim Tanner is here to discuss her progress with her obesity treatment plan. She is on the Category 2 plan and is following her eating plan approximately 80% of the time. She states she is walking 35-40 minutes 3-4 times per week and exercise videos 35 minutes 2 times per week. Kim Tanner has done well following the plan for the last few weeks - just doesn't feel she has taken in enough protein for her meals. She has been fairly active with her son. She is not weighing at home but is able to fit into clothes she couldn't fit into for the past 2 summers. We were unable to weigh the patient today for this TeleHealth visit. She feels as if she has lost weight since her last visit. She has lost 6 lbs since starting treatment with [name].  Pre-Diabetes Kim Tanner has a diagnosis of prediabetes based on her elevated Hgb A1c and was informed this puts her at greater risk of developing diabetes. Her last A1c was 5.5 on 11/18/2018. She is taking metformin currently and continues to work on diet and exercise to decrease risk of diabetes. She denies GI side effects of metformin.  Vitamin D deficiency Kim Tanner has a diagnosis of Vitamin D deficiency. She is currently taking prescription Vit D and denies nausea, vomiting or muscle weakness but does report fatigue.  ASSESSMENT AND PLAN:  Prediabetes  Vitamin D deficiency  Class 3 severe obesity with serious comorbidity and body mass index (BMI) of 45.0 to 49.9 in adult, unspecified obesity type [facility])  PLAN:  Pre-Diabetes Kim Tanner will continue to work on weight loss, exercise, and  decreasing simple carbohydrates in her diet to help decrease the risk of diabetes. We dicussed metformin including benefits and risks. She was informed that eating too many simple carbohydrates or too many calories at one sitting increases the likelihood of GI side effects. Zeriyah will continue metformin and follow-up with [name] as directed to monitor her progress.  Vitamin D Deficiency Kim Tanner was informed that low Vitamin D levels contributes to fatigue and are associated with obesity, breast, and colon cancer. She agrees to continue taking prescription Vit D (no refill needed) and will follow-up for routine testing of Vitamin D, at least 2-3 times per year. She was informed of the risk of over-replacement of Vitamin D and agrees to not increase her dose unless she discusses this with [name] first. Kim Tanner agrees to follow-up with our clinic in 2 weeks.  Obesity Kim Tanner is currently in the action stage of change. As such, her goal is to continue with weight loss efforts. She has agreed to follow the Category 2 plan and journal 1150-1300 calories and 80+ grams of protein daily. Kim Tanner has been instructed to work up to a goal of 150 minutes of combined cardio and strengthening exercise per week for weight loss and overall health benefits. We discussed the following Behavioral Modification Strategies today: increasing lean protein intake, increasing vegetables, work on meal planning and easy cooking plans, keeping healthy foods in the home, and planning for success.  Kim Tanner has agreed to follow-up with our clinic in 2 weeks. She was informed of the importance of  frequent follow-up visits to maximize her success with intensive lifestyle modifications for her multiple health conditions.  ALLERGIES: Allergies  Allergen Reactions  . Compazine [Prochlorperazine Edisylate]     MEDICATIONS: Current Outpatient Medications on File Prior to Visit  Medication Sig Dispense Refill  . albuterol (PROVENTIL  HFA;VENTOLIN HFA) 108 (90 Base) MCG/ACT inhaler Inhale 2 puffs into the lungs every 6 (six) hours as needed for wheezing or shortness of breath. 1 Inhaler 1  . buPROPion (WELLBUTRIN SR) 200 MG 12 hr tablet Take 1 tablet (200 mg total) by mouth daily. 30 tablet 0  . Fluticasone-Salmeterol (ADVAIR DISKUS) 100-50 MCG/DOSE AEPB Inhale 1 puff into the lungs 2 (two) times daily. 60 each 1  . hydrochlorothiazide (HYDRODIURIL) 25 MG tablet TAKE 1 TABLET(25 MG) BY MOUTH DAILY 90 tablet 1  . meclizine (ANTIVERT) 25 MG tablet TAKE 1 TABLET(25 MG) BY MOUTH TWICE DAILY AS NEEDED FOR DIZZINESS 30 tablet 0  . methimazole (TAPAZOLE) 5 MG tablet Take 1 tablet (5 mg total) by mouth 2 (two) times daily. 120 tablet 1  . Vitamin D, Ergocalciferol, (DRISDOL) 1.25 MG (50000 UT) CAPS capsule Take 1 capsule (50,000 Units total) by mouth every 7 (seven) days. 4 capsule 0  . metFORMIN (GLUCOPHAGE) 500 MG tablet Take 1 tablet (500 mg total) by mouth daily with breakfast. (Patient not taking: Reported on 04/16/2019) 30 tablet 0   No current facility-administered medications on file prior to visit.     PAST MEDICAL HISTORY: Past Medical History:  Diagnosis Date  . Anxiety   . Asthma   . Back pain   . Dyspnea   . HTN (hypertension)   . Joint pain   . Lower extremity edema   . Metabolic syndrome   . Morbid obesity ([place])   . Prediabetes     PAST SURGICAL HISTORY: Past Surgical History:  Procedure Laterality Date  . BREAST REDUCTION SURGERY  2008  . CESAREAN SECTION    . DILATION AND CURETTAGE OF UTERUS    . INDUCED ABORTION    . uterine ablation      SOCIAL HISTORY: Social History   Tobacco Use  . Smoking status: Never Smoker  . Smokeless tobacco: Never Used  Substance Use Topics  . Alcohol use: Yes    Comment: socially   . Drug use: No    FAMILY HISTORY: Family History  Problem Relation Age of Onset  . Hypertension Mother   . Obesity Mother   . Hypertension Father   . Diabetes Father   .  Diabetes Sister   . Stroke Sister 59   ROS: Review of Systems  Constitutional: Positive for malaise/fatigue.  Gastrointestinal: Negative for nausea and vomiting.  Musculoskeletal:       Negative for muscle weakness.   PHYSICAL EXAM: Pt in no acute distress  RECENT LABS AND TESTS: BMET    Component Value Date/Time   NA 142 11/18/2018 0900   K 4.0 11/18/2018 0900   CL 106 11/18/2018 0900   CO2 22 11/18/2018 0900   GLUCOSE 87 11/18/2018 0900   GLUCOSE 114 (H) 08/21/2017 1009   BUN 8 11/18/2018 0900   CREATININE 0.73 11/18/2018 0900   CREATININE 0.77 08/21/2017 1009   CALCIUM 9.1 11/18/2018 0900   GFRNONAA 100 11/18/2018 0900   GFRNONAA 94 08/21/2017 1009   GFRAA 115 11/18/2018 0900   GFRAA 109 08/21/2017 1009   Lab Results  Component Value Date   HGBA1C 5.5 11/18/2018   HGBA1C 5.8 (H) 09/16/2018   HGBA1C  5.5 02/25/2018   HGBA1C 5.8% 11/11/2017   Lab Results  Component Value Date   INSULIN 17.7 11/18/2018   CBC    Component Value Date/Time   WBC 6.2 11/18/2018 0900   WBC 6.4 08/21/2017 1009   RBC 4.13 11/18/2018 0900   RBC 4.23 08/21/2017 1009   HGB 11.6 11/18/2018 0900   HCT 33.5 (L) 11/18/2018 0900   PLT 242 09/16/2018 1650   MCV 81 11/18/2018 0900   MCH 28.1 11/18/2018 0900   MCH 27.9 08/21/2017 1009   MCHC 34.6 11/18/2018 0900   MCHC 33.9 08/21/2017 1009   RDW 13.8 11/18/2018 0900   LYMPHSABS 2.1 11/18/2018 0900   MONOABS 588 12/26/2016 1525   EOSABS 0.1 11/18/2018 0900   BASOSABS 0.0 11/18/2018 0900   Iron/TIBC/Ferritin/ %Sat No results found for: IRON, TIBC, FERRITIN, IRONPCTSAT Lipid Panel     Component Value Date/Time   CHOL 155 11/18/2018 0900   TRIG 88 11/18/2018 0900   HDL 43 11/18/2018 0900   CHOLHDL 4.3 02/25/2018 0934   CHOLHDL 3.6 01/10/2017 0823   VLDL 12 01/10/2017 0823   LDLCALC 94 11/18/2018 0900   Hepatic Function Panel     Component Value Date/Time   PROT 6.1 11/18/2018 0900   ALBUMIN 3.8 11/18/2018 0900   AST 21  11/18/2018 0900   ALT 19 11/18/2018 0900   ALKPHOS 90 11/18/2018 0900   BILITOT 0.3 11/18/2018 0900      Component Value Date/Time   TSH <0.01 (L) 12/19/2018 0854   TSH <0.006 (L) 11/18/2018 0900   TSH <0.006 (L) 09/16/2018 1650   Results for STARLEEN, TRUSSELL (MRN [id number]) as of 04/21/2019 06:45  Ref. Range 11/18/2018 09:00  Vitamin D, 25-Hydroxy Latest Ref Range: 30.0 - 100.0 ng/mL 7.6 (L)   I, Michaelene Song, am acting as Location manager for Ilene Qua, MD   I have reviewed the above documentation for accuracy and completeness, and I agree with the above. - Ilene Qua, MD

## 2019-04-30 ENCOUNTER — Other Ambulatory Visit: Payer: Self-pay

## 2019-04-30 ENCOUNTER — Encounter (INDEPENDENT_AMBULATORY_CARE_PROVIDER_SITE_OTHER): Payer: Self-pay | Admitting: Family Medicine

## 2019-04-30 ENCOUNTER — Telehealth (INDEPENDENT_AMBULATORY_CARE_PROVIDER_SITE_OTHER): Payer: BC Managed Care – PPO | Admitting: Family Medicine

## 2019-04-30 DIAGNOSIS — F3289 Other specified depressive episodes: Secondary | ICD-10-CM

## 2019-04-30 DIAGNOSIS — E559 Vitamin D deficiency, unspecified: Secondary | ICD-10-CM | POA: Diagnosis not present

## 2019-04-30 DIAGNOSIS — Z6841 Body Mass Index (BMI) 40.0 and over, adult: Secondary | ICD-10-CM | POA: Diagnosis not present

## 2019-04-30 MED ORDER — BUPROPION HCL ER (SR) 200 MG PO TB12: 200.0000 mg | ORAL_TABLET | Freq: Every day | ORAL | 0 refills | Status: DC

## 2019-04-30 MED ORDER — VITAMIN D (ERGOCALCIFEROL) 1.25 MG (50000 UNIT) PO CAPS: 50000.0000 [IU] | ORAL_CAPSULE | ORAL | 0 refills | Status: DC

## 2019-05-03 NOTE — Progress Notes (Signed)
Office: [phone]  /  Fax: [phone] TeleHealth Visit:  Kim Tanner has verbally consented to this TeleHealth visit today. The patient is located at home, the provider is located at the [employer] [facility] office. The participants in this visit include the listed provider and patient. The visit was conducted today via face time.  HPI:   Chief Complaint: OBESITY Kim Tanner is here to discuss her progress with her obesity treatment plan. She is on the keep a food journal with 1150-1300 calories and 80+ grams of protein daily or follow the Category 2 plan and is following her eating plan approximately 75 % of the time. She states she is walking and doing zumba for 30-40 minutes 4 times per week. Mccartney voices the last few days have been not the best, secondary to menstrual cycle. She notes increase in hunger and cravings the last few days. Snack wise, she is controlling the number of snacks.  We were unable to weigh the patient today for this TeleHealth visit. She feels as if she has maintained her weight since her last visit. She has lost 6 lbs since starting treatment with [name].  Vitamin D Deficiency [name] has a diagnosis of vitamin D deficiency. She is currently taking prescription Vit D. She notes fatigue and denies nausea, vomiting or muscle weakness.  Depression with Emotional Eating Behaviors [name] notes improvement of symptoms with Wellbutrin. [name] struggles with emotional eating and using food for comfort to the extent that it is negatively impacting her health. She often snacks when she is not hungry. [name] sometimes feels she is out of control and then feels guilty that she made poor food choices. She has been working on behavior modification techniques to help reduce her emotional eating and has been somewhat successful. She shows no sign of suicidal or homicidal ideations.  Depression screen [facility] 2/9 11/18/2018 02/25/2018 06/27/2017  Decreased Interest 1 0 0  Down,  Depressed, Hopeless 1 0 0  PHQ - 2 Score 2 0 0  Altered sleeping 2 - -  Tired, decreased energy 3 - -  Change in appetite 2 - -  Feeling bad or failure about yourself  0 - -  Trouble concentrating 1 - -  Moving slowly or fidgety/restless 1 - -  Suicidal thoughts 0 - -  PHQ-9 Score 11 - -  Difficult doing work/chores Somewhat difficult - -    ASSESSMENT AND PLAN:  Class 3 severe obesity with serious comorbidity and body mass index (BMI) of 45.0 to 49.9 in adult, unspecified obesity type ([facility])  Vitamin D deficiency - Plan: Vitamin D, Ergocalciferol, (DRISDOL) 1.25 MG (50000 UT) CAPS capsule  Other depression - with emotional eating - Plan: buPROPion (WELLBUTRIN SR) 200 MG 12 hr tablet  PLAN:  Vitamin D Deficiency [name] was informed that low vitamin D levels contributes to fatigue and are associated with obesity, breast, and colon cancer. [name] agrees to continue taking prescription Vit D 50,000 IU every week #4 and we will refill for 1 month. She will follow up for routine testing of vitamin D, at least 2-3 times per year. She was informed of the risk of over-replacement of vitamin D and agrees to not increase her dose unless she discusses this with [name] first. [name] agrees to follow up with our clinic in 3 weeks.  Depression with Emotional Eating Behaviors We discussed behavior modification techniques today to help [name] deal with her emotional eating and depression. [name] agrees to continue taking Wellbutrin SR 200 mg PO  daily #30 and we will refill for 1 month. [name] agrees to follow up with our clinic in 3 weeks.  Obesity [name] is currently in the action stage of change. As such, her goal is to continue with weight loss efforts She has agreed to follow the Category 2 plan [name] has been instructed to work up to a goal of 150 minutes of combined cardio and strengthening exercise per week for weight loss and overall health benefits. We discussed the following  Behavioral Modification Strategies today: increasing lean protein intake, increasing vegetables and work on meal planning and easy cooking plans, keeping healthy foods in the home, and planning for success   [name] has agreed to follow up with our clinic in 3 weeks. She was informed of the importance of frequent follow up visits to maximize her success with intensive lifestyle modifications for her multiple health conditions.  ALLERGIES: Allergies  Allergen Reactions  . Compazine [Prochlorperazine Edisylate]     MEDICATIONS: Current Outpatient Medications on File Prior to Visit  Medication Sig Dispense Refill  . albuterol (PROVENTIL HFA;VENTOLIN HFA) 108 (90 Base) MCG/ACT inhaler Inhale 2 puffs into the lungs every 6 (six) hours as needed for wheezing or shortness of breath. 1 Inhaler 1  . Fluticasone-Salmeterol (ADVAIR DISKUS) 100-50 MCG/DOSE AEPB Inhale 1 puff into the lungs 2 (two) times daily. 60 each 1  . hydrochlorothiazide (HYDRODIURIL) 25 MG tablet TAKE 1 TABLET(25 MG) BY MOUTH DAILY 90 tablet 1  . meclizine (ANTIVERT) 25 MG tablet TAKE 1 TABLET(25 MG) BY MOUTH TWICE DAILY AS NEEDED FOR DIZZINESS 30 tablet 0  . metFORMIN (GLUCOPHAGE) 500 MG tablet Take 1 tablet (500 mg total) by mouth daily with breakfast. (Patient not taking: Reported on 04/16/2019) 30 tablet 0  . methimazole (TAPAZOLE) 5 MG tablet Take 1 tablet (5 mg total) by mouth 2 (two) times daily. 120 tablet 1   No current facility-administered medications on file prior to visit.     PAST MEDICAL HISTORY: Past Medical History:  Diagnosis Date  . Anxiety   . Asthma   . Back pain   . Dyspnea   . HTN (hypertension)   . Joint pain   . Lower extremity edema   . Metabolic syndrome   . Morbid obesity ([place])   . Prediabetes     PAST SURGICAL HISTORY: Past Surgical History:  Procedure Laterality Date  . BREAST REDUCTION SURGERY  2008  . CESAREAN SECTION    . DILATION AND CURETTAGE OF UTERUS    . INDUCED ABORTION     . uterine ablation      SOCIAL HISTORY: Social History   Tobacco Use  . Smoking status: Never Smoker  . Smokeless tobacco: Never Used  Substance Use Topics  . Alcohol use: Yes    Comment: socially   . Drug use: No    FAMILY HISTORY: Family History  Problem Relation Age of Onset  . Hypertension Mother   . Obesity Mother   . Hypertension Father   . Diabetes Father   . Diabetes Sister   . Stroke Sister 7    ROS: Review of Systems  Constitutional: Positive for malaise/fatigue. Negative for weight loss.  Gastrointestinal: Negative for nausea and vomiting.  Musculoskeletal:       Negative muscle weakness  Psychiatric/Behavioral: Positive for depression. Negative for suicidal ideas.    PHYSICAL EXAM: Pt in no acute distress  RECENT LABS AND TESTS: BMET    Component Value Date/Time   NA 142 11/18/2018 0900  K 4.0 11/18/2018 0900   CL 106 11/18/2018 0900   CO2 22 11/18/2018 0900   GLUCOSE 87 11/18/2018 0900   GLUCOSE 114 (H) 08/21/2017 1009   BUN 8 11/18/2018 0900   CREATININE 0.73 11/18/2018 0900   CREATININE 0.77 08/21/2017 1009   CALCIUM 9.1 11/18/2018 0900   GFRNONAA 100 11/18/2018 0900   GFRNONAA 94 08/21/2017 1009   GFRAA 115 11/18/2018 0900   GFRAA 109 08/21/2017 1009   Lab Results  Component Value Date   HGBA1C 5.5 11/18/2018   HGBA1C 5.8 (H) 09/16/2018   HGBA1C 5.5 02/25/2018   HGBA1C 5.8% 11/11/2017   Lab Results  Component Value Date   INSULIN 17.7 11/18/2018   CBC    Component Value Date/Time   WBC 6.2 11/18/2018 0900   WBC 6.4 08/21/2017 1009   RBC 4.13 11/18/2018 0900   RBC 4.23 08/21/2017 1009   HGB 11.6 11/18/2018 0900   HCT 33.5 (L) 11/18/2018 0900   PLT 242 09/16/2018 1650   MCV 81 11/18/2018 0900   MCH 28.1 11/18/2018 0900   MCH 27.9 08/21/2017 1009   MCHC 34.6 11/18/2018 0900   MCHC 33.9 08/21/2017 1009   RDW 13.8 11/18/2018 0900   LYMPHSABS 2.1 11/18/2018 0900   MONOABS 588 12/26/2016 1525   EOSABS 0.1 11/18/2018  0900   BASOSABS 0.0 11/18/2018 0900   Iron/TIBC/Ferritin/ %Sat No results found for: IRON, TIBC, FERRITIN, IRONPCTSAT Lipid Panel     Component Value Date/Time   CHOL 155 11/18/2018 0900   TRIG 88 11/18/2018 0900   HDL 43 11/18/2018 0900   CHOLHDL 4.3 02/25/2018 0934   CHOLHDL 3.6 01/10/2017 0823   VLDL 12 01/10/2017 0823   LDLCALC 94 11/18/2018 0900   Hepatic Function Panel     Component Value Date/Time   PROT 6.1 11/18/2018 0900   ALBUMIN 3.8 11/18/2018 0900   AST 21 11/18/2018 0900   ALT 19 11/18/2018 0900   ALKPHOS 90 11/18/2018 0900   BILITOT 0.3 11/18/2018 0900      Component Value Date/Time   TSH <0.01 (L) 12/19/2018 0854   TSH <0.006 (L) 11/18/2018 0900   TSH <0.006 (L) 09/16/2018 1650      I, Trixie Dredge, am acting as transcriptionist for Ilene Qua, MD   I have reviewed the above documentation for accuracy and completeness, and I agree with the above. - Ilene Qua, MD

## 2019-05-11 ENCOUNTER — Ambulatory Visit (INDEPENDENT_AMBULATORY_CARE_PROVIDER_SITE_OTHER): Payer: BC Managed Care – PPO | Admitting: Family Medicine

## 2019-05-11 ENCOUNTER — Encounter: Payer: Self-pay | Admitting: Family Medicine

## 2019-05-11 ENCOUNTER — Other Ambulatory Visit: Payer: Self-pay

## 2019-05-11 VITALS — BP 120/84 | HR 78 | Temp 98.5°F | Ht 61.0 in | Wt 257.3 lb

## 2019-05-11 DIAGNOSIS — Z1239 Encounter for other screening for malignant neoplasm of breast: Secondary | ICD-10-CM | POA: Diagnosis not present

## 2019-05-11 DIAGNOSIS — J4520 Mild intermittent asthma, uncomplicated: Secondary | ICD-10-CM | POA: Diagnosis not present

## 2019-05-11 DIAGNOSIS — R7303 Prediabetes: Secondary | ICD-10-CM

## 2019-05-11 DIAGNOSIS — I10 Essential (primary) hypertension: Secondary | ICD-10-CM

## 2019-05-11 DIAGNOSIS — E559 Vitamin D deficiency, unspecified: Secondary | ICD-10-CM | POA: Insufficient documentation

## 2019-05-11 DIAGNOSIS — E0590 Thyrotoxicosis, unspecified without thyrotoxic crisis or storm: Secondary | ICD-10-CM

## 2019-05-11 DIAGNOSIS — Z0000 Encounter for general adult medical examination without abnormal findings: Secondary | ICD-10-CM

## 2019-05-11 DIAGNOSIS — E7800 Pure hypercholesterolemia, unspecified: Secondary | ICD-10-CM | POA: Insufficient documentation

## 2019-05-11 MED ORDER — FLUTICASONE-SALMETEROL 100-50 MCG/DOSE IN AEPB: 1.0000 | INHALATION_SPRAY | Freq: Two times a day (BID) | RESPIRATORY_TRACT | 1 refills | Status: DC

## 2019-05-11 NOTE — Patient Instructions (Addendum)
Your blood pressure is elevated today.   Make sure your are eating a low sodium diet (no more than 2,300 mg per day).   Check your BP at home and if your readings are not consistently less than 130/80 then let me know.   Continue on your current medications.   I will check labs today and send the results to Dr. Cruzita Lederer and to Dr. Leafy Ro.  Follow up with them at your regularly scheduled times.   Call the [facility] and schedule your mammogram.   Follow up here in 6 months or sooner if your blood pressures are not well controlled.     Preventive Care 49-72 Years Old, Female Preventive care refers to visits with your health care provider and lifestyle choices that can promote health and wellness. This includes:  A yearly physical exam. This may also be called an annual well check.  Regular dental visits and eye exams.  Immunizations.  Screening for certain conditions.  Healthy lifestyle choices, such as eating a healthy diet, getting regular exercise, not using drugs or products that contain nicotine and tobacco, and limiting alcohol use. What can I expect for my preventive care visit? Physical exam Your health care provider will check your:  Height and weight. This may be used to calculate body mass index (BMI), which tells if you are at a healthy weight.  Heart rate and blood pressure.  Skin for abnormal spots. Counseling Your health care provider may ask you questions about your:  Alcohol, tobacco, and drug use.  Emotional well-being.  Home and relationship well-being.  Sexual activity.  Eating habits.  Work and work Statistician.  Method of birth control.  Menstrual cycle.  Pregnancy history. What immunizations do I need?  Influenza (flu) vaccine  This is recommended every year. Tetanus, diphtheria, and pertussis (Tdap) vaccine  You may need a Td booster every 10 years. Varicella (chickenpox) vaccine  You may need this if you have not been  vaccinated. Zoster (shingles) vaccine  You may need this after age 56. Measles, mumps, and rubella (MMR) vaccine  You may need at least one dose of MMR if you were born in 1957 or later. You may also need a second dose. Pneumococcal conjugate (PCV13) vaccine  You may need this if you have certain conditions and were not previously vaccinated. Pneumococcal polysaccharide (PPSV23) vaccine  You may need one or two doses if you smoke cigarettes or if you have certain conditions. Meningococcal conjugate (MenACWY) vaccine  You may need this if you have certain conditions. Hepatitis A vaccine  You may need this if you have certain conditions or if you travel or work in places where you may be exposed to hepatitis A. Hepatitis B vaccine  You may need this if you have certain conditions or if you travel or work in places where you may be exposed to hepatitis B. Haemophilus influenzae type b (Hib) vaccine  You may need this if you have certain conditions. Human papillomavirus (HPV) vaccine  If recommended by your health care provider, you may need three doses over 6 months. You may receive vaccines as individual doses or as more than one vaccine together in one shot (combination vaccines). Talk with your health care provider about the risks and benefits of combination vaccines. What tests do I need? Blood tests  Lipid and cholesterol levels. These may be checked every 5 years, or more frequently if you are over 66 years old.  Hepatitis C test.  Hepatitis B test.  Screening  Lung cancer screening. You may have this screening every year starting at age 63 if you have a 30-pack-year history of smoking and currently smoke or have quit within the past 15 years.  Colorectal cancer screening. All adults should have this screening starting at age 40 and continuing until age 8. Your health care provider may recommend screening at age 17 if you are at increased risk. You will have tests every  1-10 years, depending on your results and the type of screening test.  Diabetes screening. This is done by checking your blood sugar (glucose) after you have not eaten for a while (fasting). You may have this done every 1-3 years.  Mammogram. This may be done every 1-2 years. Talk with your health care provider about when you should start having regular mammograms. This may depend on whether you have a family history of breast cancer.  BRCA-related cancer screening. This may be done if you have a family history of breast, ovarian, tubal, or peritoneal cancers.  Pelvic exam and Pap test. This may be done every 3 years starting at age 26. Starting at age 24, this may be done every 5 years if you have a Pap test in combination with an HPV test. Other tests  Sexually transmitted disease (STD) testing.  Bone density scan. This is done to screen for osteoporosis. You may have this scan if you are at high risk for osteoporosis. Follow these instructions at home: Eating and drinking  Eat a diet that includes fresh fruits and vegetables, whole grains, lean protein, and low-fat dairy.  Take vitamin and mineral supplements as recommended by your health care provider.  Do not drink alcohol if: ? Your health care provider tells you not to drink. ? You are pregnant, may be pregnant, or are planning to become pregnant.  If you drink alcohol: ? Limit how much you have to 0-1 drink a day. ? Be aware of how much alcohol is in your drink. In the [place] one drink equals one 12 oz bottle of beer (355 mL), one 5 oz glass of wine (148 mL), or one 1 oz glass of hard liquor (44 mL). Lifestyle  Take daily care of your teeth and gums.  Stay active. Exercise for at least 30 minutes on 5 or more days each week.  Do not use any products that contain nicotine or tobacco, such as cigarettes, e-cigarettes, and chewing tobacco. If you need help quitting, ask your health care provider.  If you are sexually active,  practice safe sex. Use a condom or other form of birth control (contraception) in order to prevent pregnancy and STIs (sexually transmitted infections).  If told by your health care provider, take low-dose aspirin daily starting at age 63. What's next?  Visit your health care provider once a year for a well check visit.  Ask your health care provider how often you should have your eyes and teeth checked.  Stay up to date on all vaccines. This information is not intended to replace advice given to you by your health care provider. Make sure you discuss any questions you have with your health care provider. Document Released: 10/14/2015 Document Revised: 05/29/2018 Document Reviewed: 05/29/2018 Elsevier Patient Education  2020 [employer].

## 2019-05-11 NOTE — Progress Notes (Signed)
Subjective:    Patient ID: Kim Tanner, female    DOB: Jan 25, 1971, 48 y.o.   MRN: [id number]  HPI Chief Complaint  Patient presents with  . Annual Exam  . Hypertension    follow up    She is here for a complete physical exam and to follow up on chronic health conditions.   Other providers: Endocrinology- Dr. Cruzita Lederer Dr. Vonita Moss at [facility]    Asthma- she was having issues with chest tightness when walking outside and was not on her preventive med but started using Advair again 2 weeks ago and this has resolved. Asthma well controlled again. Is not needing albuterol now that she is on her preventive.   HTN- HCTZ - taking this everyday. Does not check BP at home. Her initial BP was elevated today 142/84 and repeat BP prior to her discharge was much improved. She denies using salt and states she used a "no salt". Did not know this was high in potassium.   Vitamin D def- taking prescription strength vitamin D. This is being managed by Dr. Leafy Ro. Patient requests that I check her vitamin D level here today since she is only wanting to do virtual visits with Dr. Leafy Ro for now.   Dr. Cruzita Lederer is managing her thyroid disorder. Patient requests that I check her thyroid function here today and forward this to Dr. Cruzita Lederer. States she is taking   She is being treated for depression and emotional eating behaviors by Dr. Leafy Ro. Increased Wellbutrin dose in May 2020.   Social history: Lives with children, her oldest son is moving out soon, she is single, works as Pharmacist, hospital for [employer]  Denies smoking, drinking alcohol, drug use  Diet: fairly healthy  Excerise: 3 days per week, in her house doing [employer]. Walking   Immunizations: Tdap 2019   Health maintenance:  Mammogram: overdue for at least 5 years  Colonoscopy: 2016 per patient  Last Gynecological Exam: 2019 at [facility]. Pap smear UTD per patient. Declines pelvic or STD testing.   Last Menstrual cycle: ablation in 2017  Last Dental Exam: years ago  Last Eye Exam: visual acuity today is normal   Wears seatbelt always, smoke detectors in home and functioning, does not text while driving and feels safe in home environment.   Reviewed allergies, medications, past medical, surgical, family, and social history.    Review of Systems Review of Systems Constitutional: -fever, -chills, -sweats, -unexpected weight change,-fatigue ENT: -runny nose, -ear pain, -sore throat Cardiology:  -chest pain, -palpitations, -edema Respiratory: -cough, -shortness of breath, -wheezing Gastroenterology: -abdominal pain, -nausea, -vomiting, -diarrhea, -constipation  Hematology: -bleeding or bruising problems Musculoskeletal: -arthralgias, -myalgias, -joint swelling, -back pain Ophthalmology: -vision changes Urology: -dysuria, -difficulty urinating, -hematuria, -urinary frequency, -urgency Neurology: -headache, -weakness, -tingling, -numbness       Objective:   Physical Exam BP 120/84   Pulse 78   Temp 98.5 F (36.9 C) (Oral)   Ht 5\' 1"  (1.549 m)   Wt 257 lb 4.8 oz (116.7 kg)   SpO2 98%   BMI 48.62 kg/m   General Appearance:    Alert, cooperative, no distress, appears stated age  Head:    Normocephalic, without obvious abnormality, atraumatic  Eyes:    PERRL, conjunctiva/corneas clear, EOM's intact, fundi    benign  Ears:    Normal TM's and external ear canals  Nose:   Mask in place   Throat:   Mask in place   Neck:   Supple,  no lymphadenopathy;  thyroid:  no   enlargement/tenderness/nodules; no carotid   bruit or JVD  Back:    Spine nontender, no curvature, ROM normal, no CVA     tenderness  Lungs:     Clear to auscultation bilaterally without wheezes, rales or     ronchi; respirations unlabored  Chest Wall:    No tenderness or deformity   Heart:    Regular rate and rhythm, S1 and S2 normal, no murmur, rub   or gallop  Breast Exam:    Refused   Abdomen:     Soft,  non-tender, nondistended, normoactive bowel sounds,    no masses, no hepatosplenomegaly  Genitalia:    Refused      Extremities:   No clubbing, cyanosis or edema  Pulses:   2+ and symmetric all extremities  Skin:   Skin color, texture, turgor normal, no rashes or lesions  Lymph nodes:   Cervical, supraclavicular, and axillary nodes normal  Neurologic:   CNII-XII intact, normal strength, sensation and gait; reflexes 2+ and symmetric throughout          Psych:   Normal mood, affect, hygiene and grooming.          Assessment & Plan:  Routine general medical examination at a health care facility - Plan: CBC with Differential/Platelet, Comprehensive metabolic panel, TSH, T4, free, T3. She appears to be in her usual state of health today. No new concerns. Immunization counseling done. Health promotion and safety discussed. She will continue to follow up with her specialists.   Morbid obesity ([place]) - Plan: continue seeing Dr. Leafy Ro or her colleagues at [facility]. Encouraged her to continue with healthy lifestyle.   Prediabetes - Plan: Hemoglobin A1c, follow up pending Hgb A1c. She stopped Metformin due to GI side effects at least 2 months ago.   Essential hypertension - Plan: BP very close to goal range today on recheck prior to end of visit. Continue on HCTZ  Mild intermittent asthma without complication - Plan: Fluticasone-Salmeterol (ADVAIR DISKUS) 100-50 MCG/DOSE AEPB, controlled with Advair. Continue on preventive.   Vitamin D deficiency - Plan: VITAMIN D 25 Hydroxy (Vit-D Deficiency, Fractures), managed by Dr. Leafy Ro. I will check labs and forward per patient request since in office today.   Pure hypercholesterolemia - Plan: Lipid panel, counseling on low cholesterol diet. Check lipids and follow up  Screening for breast cancer - Plan: MM DIGITAL SCREENING BILATERAL, her last mammogram was in [place] more than 5 years ago. Refused breast exam. Advised she could have  lump/mass and not know due to lack of screening. She is aware.   Hyperthyroidism - Plan: TSH, T4, free, T3, check labs and forward to Dr. Cruzita Lederer

## 2019-05-12 LAB — CBC WITH DIFFERENTIAL/PLATELET
Basophils Absolute: 0 10*3/uL (ref 0.0–0.2)
Basos: 1 %
EOS (ABSOLUTE): 0.1 10*3/uL (ref 0.0–0.4)
Eos: 2 %
Hematocrit: 35.5 % (ref 34.0–46.6)
Hemoglobin: 12.2 g/dL (ref 11.1–15.9)
Immature Grans (Abs): 0 10*3/uL (ref 0.0–0.1)
Immature Granulocytes: 0 %
Lymphocytes Absolute: 1.8 10*3/uL (ref 0.7–3.1)
Lymphs: 29 %
MCH: 28.2 pg (ref 26.6–33.0)
MCHC: 34.4 g/dL (ref 31.5–35.7)
MCV: 82 fL (ref 79–97)
Monocytes Absolute: 0.4 10*3/uL (ref 0.1–0.9)
Monocytes: 7 %
Neutrophils Absolute: 4 10*3/uL (ref 1.4–7.0)
Neutrophils: 61 %
Platelets: 264 10*3/uL (ref 150–450)
RBC: 4.33 x10E6/uL (ref 3.77–5.28)
RDW: 14.1 % (ref 11.7–15.4)
WBC: 6.4 10*3/uL (ref 3.4–10.8)

## 2019-05-12 LAB — COMPREHENSIVE METABOLIC PANEL
ALT: 20 IU/L (ref 0–32)
AST: 19 IU/L (ref 0–40)
Albumin/Globulin Ratio: 1.5 (ref 1.2–2.2)
Albumin: 4.1 g/dL (ref 3.8–4.8)
Alkaline Phosphatase: 116 IU/L (ref 39–117)
BUN/Creatinine Ratio: 10 (ref 9–23)
BUN: 9 mg/dL (ref 6–24)
Bilirubin Total: 0.4 mg/dL (ref 0.0–1.2)
CO2: 25 mmol/L (ref 20–29)
Calcium: 9.6 mg/dL (ref 8.7–10.2)
Chloride: 99 mmol/L (ref 96–106)
Creatinine, Ser: 0.88 mg/dL (ref 0.57–1.00)
GFR calc Af Amer: 92 mL/min/{1.73_m2} (ref >59)
GFR calc non Af Amer: 80 mL/min/{1.73_m2} (ref >59)
Globulin, Total: 2.7 g/dL (ref 1.5–4.5)
Glucose: 107 mg/dL — ABNORMAL HIGH (ref 65–99)
Potassium: 3.8 mmol/L (ref 3.5–5.2)
Sodium: 139 mmol/L (ref 134–144)
Total Protein: 6.8 g/dL (ref 6.0–8.5)

## 2019-05-12 LAB — T4, FREE: Free T4: 2.37 ng/dL — ABNORMAL HIGH (ref 0.82–1.77)

## 2019-05-12 LAB — VITAMIN D 25 HYDROXY (VIT D DEFICIENCY, FRACTURES): Vit D, 25-Hydroxy: 25.6 ng/mL — ABNORMAL LOW (ref 30.0–100.0)

## 2019-05-12 LAB — HEMOGLOBIN A1C
Est. average glucose Bld gHb Est-mCnc: 114 mg/dL
Hgb A1c MFr Bld: 5.6 % (ref 4.8–5.6)

## 2019-05-12 LAB — LIPID PANEL
Chol/HDL Ratio: 4.3 ratio (ref 0.0–4.4)
Cholesterol, Total: 182 mg/dL (ref 100–199)
HDL: 42 mg/dL (ref >39)
LDL Calculated: 119 mg/dL — ABNORMAL HIGH (ref 0–99)
Triglycerides: 103 mg/dL (ref 0–149)
VLDL Cholesterol Cal: 21 mg/dL (ref 5–40)

## 2019-05-12 LAB — T3: T3, Total: 225 ng/dL — ABNORMAL HIGH (ref 71–180)

## 2019-05-12 LAB — TSH: TSH: <0.005 u[IU]/mL — ABNORMAL LOW (ref 0.450–4.500)

## 2019-05-13 ENCOUNTER — Telehealth: Payer: Self-pay | Admitting: Family Medicine

## 2019-05-13 NOTE — Telephone Encounter (Signed)
lmom for patient to call back for message from vickie

## 2019-05-13 NOTE — Telephone Encounter (Signed)
Please call and find out what her job responsibilities are and what her work setting will look like. She should wear proper PPE (mask), wash her hands often and social distance per CDC guidelines. This will help minimize her risk of contracting Covid-19. Find out more information from her please.

## 2019-05-13 NOTE — Telephone Encounter (Signed)
Pt called and wanted to know if she can get a note for work stating that she has a health condition. Pt has been working from home and would like to continue to do so. She was told they have to return in office soon.

## 2019-05-14 ENCOUNTER — Telehealth: Payer: Self-pay

## 2019-05-14 NOTE — Telephone Encounter (Signed)
lmom asking patient call the office for a message from vickie

## 2019-05-14 NOTE — Telephone Encounter (Signed)
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 The county is allowing staff members to bing their children to the school as well if they don't have daycare for them. Patient does not feel comfortable being at the school as she will be sharing a classroom with one other assistant and the teacher. Patient was advised on wearing the proper PPE and social distancing. Patient has also expressed that she feels like she's high risk because she has asthma. She stated there was some staff members at the school who were not wearing a mask on yesterday.

## 2019-05-14 NOTE — Telephone Encounter (Signed)
I spoke with my supervising physician regarding her situation.  Please let her know that I really cannot legally write a letter requiring her to work from home.  As long as she is wearing her mask, washing her hands frequently and is able to social distance properly then her risk of contracting COVID-19 is no greater than anyone else and should be low. If her situation changes, have her call and let me know.

## 2019-05-14 NOTE — Telephone Encounter (Signed)
Left message for patient to return our call at [phone].  

## 2019-05-14 NOTE — Telephone Encounter (Signed)
-----   Message from Philemon Kingdom, MD sent at 05/14/2019 12:14 PM EDT ----- Lenna Sciara, can you please call pt:  Her TFTs are worse - did she miss MMI doses? If not, let's increase to 10 mg 2x a day (can you please change this on her med list) and order a TSH, fT4 and fT3 for ~5 weeks from now?

## 2019-05-14 NOTE — Telephone Encounter (Signed)
I spoke with the patient regarding her results and message from Dr. Cruzita Lederer.  Patient states she has not been taking the PM dose because she forgets.  Patient is wondering if she can take her dose in one pill a day or does it have to be a morning and nightly pill? Can patient do labs in October when she has her follow up visit?

## 2019-05-14 NOTE — Telephone Encounter (Signed)
Patient has been informed of providers message.  

## 2019-05-17 NOTE — Telephone Encounter (Signed)
Yes, take the whole day in 1 dose, if she prefers and we can check labs in October.

## 2019-05-21 ENCOUNTER — Encounter (INDEPENDENT_AMBULATORY_CARE_PROVIDER_SITE_OTHER): Payer: Self-pay | Admitting: Family Medicine

## 2019-05-21 ENCOUNTER — Other Ambulatory Visit: Payer: Self-pay

## 2019-05-21 ENCOUNTER — Telehealth (INDEPENDENT_AMBULATORY_CARE_PROVIDER_SITE_OTHER): Payer: BC Managed Care – PPO | Admitting: Family Medicine

## 2019-05-21 DIAGNOSIS — E0590 Thyrotoxicosis, unspecified without thyrotoxic crisis or storm: Secondary | ICD-10-CM | POA: Diagnosis not present

## 2019-05-21 DIAGNOSIS — R7303 Prediabetes: Secondary | ICD-10-CM

## 2019-05-21 DIAGNOSIS — Z6841 Body Mass Index (BMI) 40.0 and over, adult: Secondary | ICD-10-CM

## 2019-05-26 NOTE — Progress Notes (Signed)
Office: [phone]  /  Fax: [phone] TeleHealth Visit:  Kim Tanner has verbally consented to this TeleHealth visit today. The patient is located at home, the provider is located at the [employer] [facility] office. The participants in this visit include the listed provider and patient. The visit was conducted today via face time.  HPI:   Chief Complaint: OBESITY Kim Tanner is here to discuss her progress with her obesity treatment plan. She is on the Category 2 plan and is following her eating plan approximately 75 % of the time. She states she is walking for 30 minutes 3-5 times per week. Kim Tanner voices the past few weeks have been stressful with returning to work. 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 She is feeling frustrated and questions whether now is a good time to continue the program.  We were unable to weigh the patient today for this TeleHealth visit. She feels as if she has maintained her weight since her last visit. She has lost 6 lbs since starting treatment with [name].  Pre-Diabetes Kim Tanner has a diagnosis of pre-diabetes based on her elevated Hgb A1c and was informed this puts her at greater risk of developing diabetes. Last Hgb A1c was 5.6 on 05/11/2019, and no insulin level from that visit. She is taking metformin currently and continues to work on diet and exercise to decrease risk of diabetes. She denies nausea or hypoglycemia.  Hyperthyroidism Kim Tanner has a diagnosis of hyperthyroidism. She sees Dr. Cruzita Lederer (Endocrinologist). Her last thyroid function on labs were abnormal.  ASSESSMENT AND PLAN:  Prediabetes  Hyperthyroidism  Class 3 severe obesity with serious comorbidity and body mass index (BMI) of 45.0 to 49.9 in adult, unspecified obesity type [facility])  PLAN:  Pre-Diabetes Kim Tanner will continue to work on weight loss, exercise, and decreasing simple carbohydrates in her diet to help decrease  the risk of diabetes. We dicussed metformin including benefits and risks. She was informed that eating too many simple carbohydrates or too many calories at one sitting increases the likelihood of GI side effects. Kim Tanner agrees to continue taking metformin, and we will repeat labs in November. Kim Tanner agrees to follow up with our clinic in 3 weeks as directed to monitor her progress.  Hyperthyroidism Kim Tanner was informed of the importance of good thyroid control to help with weight loss efforts. She was also informed that supertheraputic thyroid levels are dangerous and will not improve weight loss results. Kim Tanner is to follow up with Dr. Cruzita Lederer, she is now taking medications once daily. Labs planned for repeat in 5 weeks. Kim Tanner agrees to follow up with our clinic in 3 weeks.  Obesity Kim Tanner is currently in the action stage of change. As such, her goal is to continue with weight loss efforts She has agreed to follow the Category 2 plan Kim Tanner has been instructed to work up to a goal of 150 minutes of combined cardio and strengthening exercise per week for weight loss and overall health benefits. We discussed the following Behavioral Modification Strategies today: increasing lean protein intake, increasing vegetables and work on meal planning and easy cooking plans, keeping healthy foods in the home, and planning for success   Kim Tanner has agreed to follow up with our clinic in 3 weeks. She was informed of the importance of frequent follow up visits to maximize her success with intensive lifestyle modifications for her multiple health conditions.  ALLERGIES: Allergies  Allergen Reactions  .  Compazine [Prochlorperazine Edisylate]     MEDICATIONS: Current Outpatient Medications on File Prior to Visit  Medication Sig Dispense Refill  . albuterol (PROVENTIL HFA;VENTOLIN HFA) 108 (90 Base) MCG/ACT inhaler Inhale 2 puffs into the lungs every 6 (six) hours as needed for wheezing or shortness of  breath. 1 Inhaler 1  . buPROPion (WELLBUTRIN SR) 200 MG 12 hr tablet Take 1 tablet (200 mg total) by mouth daily. 30 tablet 0  . Fluticasone-Salmeterol (ADVAIR DISKUS) 100-50 MCG/DOSE AEPB Inhale 1 puff into the lungs 2 (two) times daily. 60 each 1  . hydrochlorothiazide (HYDRODIURIL) 25 MG tablet TAKE 1 TABLET(25 MG) BY MOUTH DAILY 90 tablet 1  . meclizine (ANTIVERT) 25 MG tablet TAKE 1 TABLET(25 MG) BY MOUTH TWICE DAILY AS NEEDED FOR DIZZINESS 30 tablet 0  . methimazole (TAPAZOLE) 5 MG tablet Take 1 tablet (5 mg total) by mouth 2 (two) times daily. 120 tablet 1  . Vitamin D, Ergocalciferol, (DRISDOL) 1.25 MG (50000 UT) CAPS capsule Take 1 capsule (50,000 Units total) by mouth every 7 (seven) days. 4 capsule 0   No current facility-administered medications on file prior to visit.     PAST MEDICAL HISTORY: Past Medical History:  Diagnosis Date  . Anxiety   . Asthma   . Back pain   . Dyspnea   . HTN (hypertension)   . Joint pain   . Lower extremity edema   . Metabolic syndrome   . Morbid obesity ([place])   . Prediabetes     PAST SURGICAL HISTORY: Past Surgical History:  Procedure Laterality Date  . BREAST REDUCTION SURGERY  2008  . CESAREAN SECTION    . DILATION AND CURETTAGE OF UTERUS    . INDUCED ABORTION    . uterine ablation      SOCIAL HISTORY: Social History   Tobacco Use  . Smoking status: Never Smoker  . Smokeless tobacco: Never Used  Substance Use Topics  . Alcohol use: Not Currently    Comment: socially   . Drug use: No    FAMILY HISTORY: Family History  Problem Relation Age of Onset  . Hypertension Mother   . Obesity Mother   . Hypertension Father   . Diabetes Father   . Diabetes Sister   . Stroke Sister 60    ROS: Review of Systems  Constitutional: Negative for weight loss.  Gastrointestinal: Negative for nausea.  Endo/Heme/Allergies:       Negative hypoglycemia    PHYSICAL EXAM: Pt in no acute distress  RECENT LABS AND TESTS: BMET     Component Value Date/Time   NA 139 05/11/2019 0900   K 3.8 05/11/2019 0900   CL 99 05/11/2019 0900   CO2 25 05/11/2019 0900   GLUCOSE 107 (H) 05/11/2019 0900   GLUCOSE 114 (H) 08/21/2017 1009   BUN 9 05/11/2019 0900   CREATININE 0.88 05/11/2019 0900   CREATININE 0.77 08/21/2017 1009   CALCIUM 9.6 05/11/2019 0900   GFRNONAA 80 05/11/2019 0900   GFRNONAA 94 08/21/2017 1009   GFRAA 92 05/11/2019 0900   GFRAA 109 08/21/2017 1009   Lab Results  Component Value Date   HGBA1C 5.6 05/11/2019   HGBA1C 5.5 11/18/2018   HGBA1C 5.8 (H) 09/16/2018   HGBA1C 5.5 02/25/2018   HGBA1C 5.8% 11/11/2017   Lab Results  Component Value Date   INSULIN 17.7 11/18/2018   CBC    Component Value Date/Time   WBC 6.4 05/11/2019 0900   WBC 6.4 08/21/2017 1009   RBC 4.33  05/11/2019 0900   RBC 4.23 08/21/2017 1009   HGB 12.2 05/11/2019 0900   HCT 35.5 05/11/2019 0900   PLT 264 05/11/2019 0900   MCV 82 05/11/2019 0900   MCH 28.2 05/11/2019 0900   MCH 27.9 08/21/2017 1009   MCHC 34.4 05/11/2019 0900   MCHC 33.9 08/21/2017 1009   RDW 14.1 05/11/2019 0900   LYMPHSABS 1.8 05/11/2019 0900   MONOABS 588 12/26/2016 1525   EOSABS 0.1 05/11/2019 0900   BASOSABS 0.0 05/11/2019 0900   Iron/TIBC/Ferritin/ %Sat No results found for: IRON, TIBC, FERRITIN, IRONPCTSAT Lipid Panel     Component Value Date/Time   CHOL 182 05/11/2019 0900   TRIG 103 05/11/2019 0900   HDL 42 05/11/2019 0900   CHOLHDL 4.3 05/11/2019 0900   CHOLHDL 3.6 01/10/2017 0823   VLDL 12 01/10/2017 0823   LDLCALC 119 (H) 05/11/2019 0900   Hepatic Function Panel     Component Value Date/Time   PROT 6.8 05/11/2019 0900   ALBUMIN 4.1 05/11/2019 0900   AST 19 05/11/2019 0900   ALT 20 05/11/2019 0900   ALKPHOS 116 05/11/2019 0900   BILITOT 0.4 05/11/2019 0900      Component Value Date/Time   TSH <0.005 (L) 05/11/2019 0900   TSH <0.01 (L) 12/19/2018 0854   TSH <0.006 (L) 11/18/2018 0900      I, Trixie Dredge, am acting as  transcriptionist for Ilene Qua, MD  I have reviewed the above documentation for accuracy and completeness, and I agree with the above. - Ilene Qua, MD

## 2019-06-11 ENCOUNTER — Encounter (INDEPENDENT_AMBULATORY_CARE_PROVIDER_SITE_OTHER): Payer: Self-pay | Admitting: Family Medicine

## 2019-06-11 ENCOUNTER — Telehealth (INDEPENDENT_AMBULATORY_CARE_PROVIDER_SITE_OTHER): Payer: BC Managed Care – PPO | Admitting: Family Medicine

## 2019-06-11 ENCOUNTER — Other Ambulatory Visit: Payer: Self-pay

## 2019-06-11 DIAGNOSIS — I10 Essential (primary) hypertension: Secondary | ICD-10-CM | POA: Diagnosis not present

## 2019-06-11 DIAGNOSIS — E559 Vitamin D deficiency, unspecified: Secondary | ICD-10-CM

## 2019-06-11 DIAGNOSIS — Z6841 Body Mass Index (BMI) 40.0 and over, adult: Secondary | ICD-10-CM

## 2019-06-11 MED ORDER — VITAMIN D (ERGOCALCIFEROL) 1.25 MG (50000 UNIT) PO CAPS: 50000.0000 [IU] | ORAL_CAPSULE | ORAL | 0 refills | Status: DC

## 2019-06-15 NOTE — Progress Notes (Signed)
Office: [phone]  /  Fax: [phone] TeleHealth Visit:  Kim Tanner has verbally consented to this TeleHealth visit today. The patient is located at home, the provider is located at the [employer] [facility] office. The participants in this visit include the listed provider and patient. The visit was conducted today via face time.  HPI:   Chief Complaint: OBESITY Kim Tanner is here to discuss her progress with her obesity treatment plan. She is on the Category 2 plan and is following her eating plan approximately 60 % of the time. She states she is walking for 120 minutes 2 times per week. Kim Tanner is not weighing herself at home, but she reports her pants are feeling looser. She is still not eating bread or drinking soda, but did indulge in soda this past weekend. She has been walking at the [facility] in the morning.  We were unable to weigh the patient today for this TeleHealth visit. She feels as if she has lost weight since her last visit. She has lost 6 lbs since starting treatment with [name].  Vitamin D Deficiency Kim Tanner has a diagnosis of vitamin D deficiency. She is currently taking prescription Vit D. She notes fatigue and denies nausea, vomiting or muscle weakness.  Hypertension Kim Tanner is a 48 y.o. female with hypertension. Kim Tanner's blood pressure is controlled at home. She denies chest pain, chest pressure, or headaches. She is working on weight loss to help control her blood pressure with the goal of decreasing her risk of heart attack and stroke.   ASSESSMENT AND PLAN:  Essential hypertension  Vitamin D deficiency - Plan: Vitamin D, Ergocalciferol, (DRISDOL) 1.25 MG (50000 UT) CAPS capsule  Class 3 severe obesity with serious comorbidity and body mass index (BMI) of 45.0 to 49.9 in adult, unspecified obesity type ([place])  PLAN:  Vitamin D Deficiency Kim Tanner was informed that low vitamin D levels contributes to fatigue and are associated with obesity,  breast, and colon cancer. Kim Tanner agrees to continue taking prescription Vit D 50,000 IU every week #4 and we will refill for 1 month. She will follow up for routine testing of vitamin D, at least 2-3 times per year. She was informed of the risk of over-replacement of vitamin D and agrees to not increase her dose unless she discusses this with [name] first. Kim Tanner agrees to follow up with our clinic in 2 weeks.  Hypertension We discussed sodium restriction, working on healthy weight loss, and a regular exercise program as the means to achieve improved blood pressure control. Kim Tanner agreed with this plan and agreed to follow up as directed. We will continue to monitor her blood pressure as well as her progress with the above lifestyle modifications. Kim Tanner agrees to continue her current medications, no change in doses. She will watch for signs of hypotension as she continues her lifestyle modifications. Kim Tanner agrees to follow up with our clinic in 2 weeks.  Obesity Kim Tanner is currently in the action stage of change. As such, her goal is to continue with weight loss efforts She has agreed to follow the Category 2 plan Kim Tanner has been instructed to work up to a goal of 150 minutes of combined cardio and strengthening exercise per week for weight loss and overall health benefits. We discussed the following Behavioral Modification Strategies today: increasing lean protein intake, increasing vegetables and work on meal planning and easy cooking plans, keeping healthy foods in the home, and planning for success   Kim Tanner has agreed to follow up  with our clinic in 2 weeks. She was informed of the importance of frequent follow up visits to maximize her success with intensive lifestyle modifications for her multiple health conditions.  ALLERGIES: Allergies  Allergen Reactions  . Compazine [Prochlorperazine Edisylate]     MEDICATIONS: Current Outpatient Medications on File Prior to Visit  Medication  Sig Dispense Refill  . albuterol (PROVENTIL HFA;VENTOLIN HFA) 108 (90 Base) MCG/ACT inhaler Inhale 2 puffs into the lungs every 6 (six) hours as needed for wheezing or shortness of breath. 1 Inhaler 1  . buPROPion (WELLBUTRIN SR) 200 MG 12 hr tablet Take 1 tablet (200 mg total) by mouth daily. 30 tablet 0  . Fluticasone-Salmeterol (ADVAIR DISKUS) 100-50 MCG/DOSE AEPB Inhale 1 puff into the lungs 2 (two) times daily. 60 each 1  . hydrochlorothiazide (HYDRODIURIL) 25 MG tablet TAKE 1 TABLET(25 MG) BY MOUTH DAILY 90 tablet 1  . meclizine (ANTIVERT) 25 MG tablet TAKE 1 TABLET(25 MG) BY MOUTH TWICE DAILY AS NEEDED FOR DIZZINESS 30 tablet 0  . methimazole (TAPAZOLE) 5 MG tablet Take 1 tablet (5 mg total) by mouth 2 (two) times daily. 120 tablet 1   No current facility-administered medications on file prior to visit.     PAST MEDICAL HISTORY: Past Medical History:  Diagnosis Date  . Anxiety   . Asthma   . Back pain   . Dyspnea   . HTN (hypertension)   . Joint pain   . Lower extremity edema   . Metabolic syndrome   . Morbid obesity ([place])   . Prediabetes     PAST SURGICAL HISTORY: Past Surgical History:  Procedure Laterality Date  . BREAST REDUCTION SURGERY  2008  . CESAREAN SECTION    . DILATION AND CURETTAGE OF UTERUS    . INDUCED ABORTION    . uterine ablation      SOCIAL HISTORY: Social History   Tobacco Use  . Smoking status: Never Smoker  . Smokeless tobacco: Never Used  Substance Use Topics  . Alcohol use: Not Currently    Comment: socially   . Drug use: No    FAMILY HISTORY: Family History  Problem Relation Age of Onset  . Hypertension Mother   . Obesity Mother   . Hypertension Father   . Diabetes Father   . Diabetes Sister   . Stroke Sister 61    ROS: Review of Systems  Constitutional: Positive for malaise/fatigue and weight loss.  Cardiovascular: Negative for chest pain.       Negative chest pressure  Gastrointestinal: Negative for nausea and vomiting.   Musculoskeletal:       Negative muscle weakness  Neurological: Negative for headaches.    PHYSICAL EXAM: Pt in no acute distress  RECENT LABS AND TESTS: BMET    Component Value Date/Time   NA 139 05/11/2019 0900   K 3.8 05/11/2019 0900   CL 99 05/11/2019 0900   CO2 25 05/11/2019 0900   GLUCOSE 107 (H) 05/11/2019 0900   GLUCOSE 114 (H) 08/21/2017 1009   BUN 9 05/11/2019 0900   CREATININE 0.88 05/11/2019 0900   CREATININE 0.77 08/21/2017 1009   CALCIUM 9.6 05/11/2019 0900   GFRNONAA 80 05/11/2019 0900   GFRNONAA 94 08/21/2017 1009   GFRAA 92 05/11/2019 0900   GFRAA 109 08/21/2017 1009   Lab Results  Component Value Date   HGBA1C 5.6 05/11/2019   HGBA1C 5.5 11/18/2018   HGBA1C 5.8 (H) 09/16/2018   HGBA1C 5.5 02/25/2018   HGBA1C 5.8% 11/11/2017  Lab Results  Component Value Date   INSULIN 17.7 11/18/2018   CBC    Component Value Date/Time   WBC 6.4 05/11/2019 0900   WBC 6.4 08/21/2017 1009   RBC 4.33 05/11/2019 0900   RBC 4.23 08/21/2017 1009   HGB 12.2 05/11/2019 0900   HCT 35.5 05/11/2019 0900   PLT 264 05/11/2019 0900   MCV 82 05/11/2019 0900   MCH 28.2 05/11/2019 0900   MCH 27.9 08/21/2017 1009   MCHC 34.4 05/11/2019 0900   MCHC 33.9 08/21/2017 1009   RDW 14.1 05/11/2019 0900   LYMPHSABS 1.8 05/11/2019 0900   MONOABS 588 12/26/2016 1525   EOSABS 0.1 05/11/2019 0900   BASOSABS 0.0 05/11/2019 0900   Iron/TIBC/Ferritin/ %Sat No results found for: IRON, TIBC, FERRITIN, IRONPCTSAT Lipid Panel     Component Value Date/Time   CHOL 182 05/11/2019 0900   TRIG 103 05/11/2019 0900   HDL 42 05/11/2019 0900   CHOLHDL 4.3 05/11/2019 0900   CHOLHDL 3.6 01/10/2017 0823   VLDL 12 01/10/2017 0823   LDLCALC 119 (H) 05/11/2019 0900   Hepatic Function Panel     Component Value Date/Time   PROT 6.8 05/11/2019 0900   ALBUMIN 4.1 05/11/2019 0900   AST 19 05/11/2019 0900   ALT 20 05/11/2019 0900   ALKPHOS 116 05/11/2019 0900   BILITOT 0.4 05/11/2019 0900       Component Value Date/Time   TSH <0.005 (L) 05/11/2019 0900   TSH <0.01 (L) 12/19/2018 0854   TSH <0.006 (L) 11/18/2018 0900      I, Trixie Dredge, am acting as transcriptionist for Ilene Qua, MD  I have reviewed the above documentation for accuracy and completeness, and I agree with the above. - Ilene Qua, MD

## 2019-06-25 ENCOUNTER — Ambulatory Visit
Admission: RE | Admit: 2019-06-25 | Discharge: 2019-06-25 | Disposition: A | Discharge status: Home / Self Care | Payer: BC Managed Care – PPO | Source: Ambulatory Visit | Attending: Family Medicine | Admitting: Family Medicine

## 2019-06-25 ENCOUNTER — Other Ambulatory Visit: Payer: Self-pay

## 2019-06-25 DIAGNOSIS — Z1239 Encounter for other screening for malignant neoplasm of breast: Secondary | ICD-10-CM

## 2019-06-29 ENCOUNTER — Other Ambulatory Visit: Payer: Self-pay

## 2019-06-29 ENCOUNTER — Ambulatory Visit (INDEPENDENT_AMBULATORY_CARE_PROVIDER_SITE_OTHER): Payer: BC Managed Care – PPO | Admitting: Family Medicine

## 2019-06-29 ENCOUNTER — Encounter (INDEPENDENT_AMBULATORY_CARE_PROVIDER_SITE_OTHER): Payer: Self-pay | Admitting: Family Medicine

## 2019-06-29 DIAGNOSIS — Z6841 Body Mass Index (BMI) 40.0 and over, adult: Secondary | ICD-10-CM

## 2019-06-29 DIAGNOSIS — E559 Vitamin D deficiency, unspecified: Secondary | ICD-10-CM

## 2019-06-29 DIAGNOSIS — E0590 Thyrotoxicosis, unspecified without thyrotoxic crisis or storm: Secondary | ICD-10-CM

## 2019-06-29 MED ORDER — VITAMIN D (ERGOCALCIFEROL) 1.25 MG (50000 UNIT) PO CAPS: 50000.0000 [IU] | ORAL_CAPSULE | ORAL | 0 refills | Status: DC

## 2019-06-29 NOTE — Progress Notes (Signed)
Office: [phone]  /  Fax: [phone] TeleHealth Visit:  Kim Tanner has verbally consented to this TeleHealth visit today. The patient is located at home, the provider is located at the [employer] [facility] office. The participants in this visit include the listed provider and patient. The visit was conducted today via Face Time.  HPI:   Chief Complaint: OBESITY Kim Tanner is here to discuss her progress with her obesity treatment plan. She is keeping a food journal with 1250 calories and following the Category 2 plan and is following her eating plan approximately 75 % of the time. She states she is walking 30 minutes 2 times per week and walking trails for 60 minutes 2 times per week. Kim Tanner voices that she has been journaling more to keep track of calories and keeping control of cholesterol. She is doing the Category 2 and journaling mix. Kim Tanner feels that she has lost weight, but didn't weigh today.  We were unable to weigh the patient today for this TeleHealth visit. She feels as if she has lost weight since her last visit. She has lost 6 lbs since starting treatment with [name].  Vitamin D deficiency Kim Tanner has a diagnosis of vitamin D deficiency. She is currently taking vit D. She admits fatigue and denies nausea, vomiting, or muscle weakness.  Thyrotoxicosis Kim Tanner is on methimazole for thyrotoxicosis in which she sees Dr. Renne Crigler.  ASSESSMENT AND PLAN:  Thyrotoxicosis without thyroid storm, unspecified thyrotoxicosis type  Vitamin D deficiency - Plan: Vitamin D, Ergocalciferol, (DRISDOL) 1.25 MG (50000 UT) CAPS capsule  Class 3 severe obesity with serious comorbidity and body mass index (BMI) of 45.0 to 49.9 in adult, unspecified obesity type ([place])  PLAN:  Vitamin D Deficiency Kim Tanner was informed that low vitamin D levels contribute to fatigue and are associated with obesity, breast, and colon cancer. Kim Tanner agrees to continue to take prescription Vit D @50 ,000  IU every week #4 with no refills and will follow up for routine testing of vitamin D, at least 2-3 times per year. She was informed of the risk of over-replacement of vitamin D and agrees to not increase her dose unless she discusses this with [name] first. Meleia agrees to follow up in 2 weeks as directed.  Thyrotoxicosis Kim Tanner agrees to follow up with Dr Renne Crigler and with our office as directed.   Obesity Kim Tanner is currently in the action stage of change. As such, her goal is to continue with weight loss efforts. She has agreed to keep a food journal with 1250 calories and 85+ grams of protein and follow the Category 2 plan. [name] has been instructed to work up to a goal of 150 minutes of combined cardio and strengthening exercise per week for weight loss and overall health benefits. We discussed the following Behavioral Modification Strategies today: increasing lean protein intake, increasing vegetables, keeping healthy foods in the home, planning for success, and work on meal planning and easy cooking plans.  Kim Tanner has agreed to follow up with our clinic in 2 weeks. She was informed of the importance of frequent follow up visits to maximize her success with intensive lifestyle modifications for her multiple health conditions.  ALLERGIES: Allergies  Allergen Reactions   Compazine [Prochlorperazine Edisylate]     MEDICATIONS: Current Outpatient Medications on File Prior to Visit  Medication Sig Dispense Refill   albuterol (PROVENTIL HFA;VENTOLIN HFA) 108 (90 Base) MCG/ACT inhaler Inhale 2 puffs into the lungs every 6 (six) hours as needed for wheezing or shortness of  breath. 1 Inhaler 1   buPROPion (WELLBUTRIN SR) 200 MG 12 hr tablet Take 1 tablet (200 mg total) by mouth daily. 30 tablet 0   Fluticasone-Salmeterol (ADVAIR DISKUS) 100-50 MCG/DOSE AEPB Inhale 1 puff into the lungs 2 (two) times daily. 60 each 1   hydrochlorothiazide (HYDRODIURIL) 25 MG tablet TAKE 1 TABLET(25 MG) BY  MOUTH DAILY 90 tablet 1   meclizine (ANTIVERT) 25 MG tablet TAKE 1 TABLET(25 MG) BY MOUTH TWICE DAILY AS NEEDED FOR DIZZINESS 30 tablet 0   methimazole (TAPAZOLE) 5 MG tablet Take 1 tablet (5 mg total) by mouth 2 (two) times daily. 120 tablet 1   No current facility-administered medications on file prior to visit.     PAST MEDICAL HISTORY: Past Medical History:  Diagnosis Date   Anxiety    Asthma    Back pain    Dyspnea    HTN (hypertension)    Joint pain    Lower extremity edema    Metabolic syndrome    Morbid obesity ([place])    Prediabetes     PAST SURGICAL HISTORY: Past Surgical History:  Procedure Laterality Date   BREAST REDUCTION SURGERY  2008   CESAREAN SECTION     DILATION AND CURETTAGE OF UTERUS     INDUCED ABORTION     REDUCTION MAMMAPLASTY     uterine ablation      SOCIAL HISTORY: Social History   Tobacco Use   Smoking status: Never Smoker   Smokeless tobacco: Never Used  Substance Use Topics   Alcohol use: Not Currently    Comment: socially    Drug use: No    FAMILY HISTORY: Family History  Problem Relation Age of Onset   Hypertension Mother    Obesity Mother    Hypertension Father    Diabetes Father    Diabetes Sister    Stroke Sister 60   Breast cancer Neg Hx     ROS: Review of Systems  Constitutional: Positive for malaise/fatigue.  Gastrointestinal: Negative for nausea and vomiting.  Musculoskeletal:       Negative for muscle weakness.    PHYSICAL EXAM: Pt in no acute distress  RECENT LABS AND TESTS: BMET    Component Value Date/Time   NA 139 05/11/2019 0900   K 3.8 05/11/2019 0900   CL 99 05/11/2019 0900   CO2 25 05/11/2019 0900   GLUCOSE 107 (H) 05/11/2019 0900   GLUCOSE 114 (H) 08/21/2017 1009   BUN 9 05/11/2019 0900   CREATININE 0.88 05/11/2019 0900   CREATININE 0.77 08/21/2017 1009   CALCIUM 9.6 05/11/2019 0900   GFRNONAA 80 05/11/2019 0900   GFRNONAA 94 08/21/2017 1009   GFRAA 92  05/11/2019 0900   GFRAA 109 08/21/2017 1009   Lab Results  Component Value Date   HGBA1C 5.6 05/11/2019   HGBA1C 5.5 11/18/2018   HGBA1C 5.8 (H) 09/16/2018   HGBA1C 5.5 02/25/2018   HGBA1C 5.8% 11/11/2017   Lab Results  Component Value Date   INSULIN 17.7 11/18/2018   CBC    Component Value Date/Time   WBC 6.4 05/11/2019 0900   WBC 6.4 08/21/2017 1009   RBC 4.33 05/11/2019 0900   RBC 4.23 08/21/2017 1009   HGB 12.2 05/11/2019 0900   HCT 35.5 05/11/2019 0900   PLT 264 05/11/2019 0900   MCV 82 05/11/2019 0900   MCH 28.2 05/11/2019 0900   MCH 27.9 08/21/2017 1009   MCHC 34.4 05/11/2019 0900   MCHC 33.9 08/21/2017 1009   RDW 14.1  05/11/2019 0900   LYMPHSABS 1.8 05/11/2019 0900   MONOABS 588 12/26/2016 1525   EOSABS 0.1 05/11/2019 0900   BASOSABS 0.0 05/11/2019 0900   Iron/TIBC/Ferritin/ %Sat No results found for: IRON, TIBC, FERRITIN, IRONPCTSAT Lipid Panel     Component Value Date/Time   CHOL 182 05/11/2019 0900   TRIG 103 05/11/2019 0900   HDL 42 05/11/2019 0900   CHOLHDL 4.3 05/11/2019 0900   CHOLHDL 3.6 01/10/2017 0823   VLDL 12 01/10/2017 0823   LDLCALC 119 (H) 05/11/2019 0900   Hepatic Function Panel     Component Value Date/Time   PROT 6.8 05/11/2019 0900   ALBUMIN 4.1 05/11/2019 0900   AST 19 05/11/2019 0900   ALT 20 05/11/2019 0900   ALKPHOS 116 05/11/2019 0900   BILITOT 0.4 05/11/2019 0900      Component Value Date/Time   TSH <0.005 (L) 05/11/2019 0900   TSH <0.01 (L) 12/19/2018 0854   TSH <0.006 (L) 11/18/2018 0900   Results for NITZIA, HALING (MRN [id number]) as of 06/29/2019 16:30  Ref. Range 05/11/2019 09:00  Vitamin D, 25-Hydroxy Latest Ref Range: 30.0 - 100.0 ng/mL 25.6 (L)    I, Marcille Blanco, CMA, am acting as transcriptionist for Eber Jones, MD  I have reviewed the above documentation for accuracy and completeness, and I agree with the above. - Ilene Qua, MD

## 2019-07-10 ENCOUNTER — Other Ambulatory Visit: Payer: Self-pay

## 2019-07-14 ENCOUNTER — Encounter: Payer: Self-pay | Admitting: Internal Medicine

## 2019-07-14 ENCOUNTER — Ambulatory Visit (INDEPENDENT_AMBULATORY_CARE_PROVIDER_SITE_OTHER): Payer: BC Managed Care – PPO | Admitting: Internal Medicine

## 2019-07-14 ENCOUNTER — Other Ambulatory Visit: Payer: Self-pay

## 2019-07-14 VITALS — BP 118/60 | HR 82 | Ht 61.0 in | Wt 252.0 lb

## 2019-07-14 DIAGNOSIS — E041 Nontoxic single thyroid nodule: Secondary | ICD-10-CM

## 2019-07-14 DIAGNOSIS — E0500 Thyrotoxicosis with diffuse goiter without thyrotoxic crisis or storm: Secondary | ICD-10-CM | POA: Diagnosis not present

## 2019-07-14 NOTE — Progress Notes (Addendum)
Patient ID: Kim Tanner, female   DOB: September 13, 1971, 48 y.o.   MRN: [id number]    HPI  Kim Tanner is a 48 y.o.-year-old female, initially referred by her PCP,Henson, Vickie L, NP-C, returning for follow-up for thyrotoxicosis, diagnosed as Graves' disease after last visit.  Last visit 6 months ago.  Reviewed and addended history: Patient was diagnosed with thyrotoxicosis in 11/2016.  She was referred to endocrinology then, but could not come at that time, and only got to see me for the first time in 11/2018.  At last visit, her TFTs were still thyrotoxic and her TSI antibodies were elevated, confirming Graves' disease.  We started methimazole 5 mg twice a day, but she did not come back for labs 1.5 months later...  She had another set of TFTs by PCP in 05/2019 and these appeared worse.    She tells me that before her labs in 05/2019, she was missing 2nd dose of MMI at the day.  We contacted her to start taking the medication consistently.  She is currently taking 5 mg twice a day.  Reviewed her TFTs: Lab Results  Component Value Date   TSH <0.005 (L) 05/11/2019   TSH <0.01 (L) 12/19/2018   TSH <0.006 (L) 11/18/2018   TSH <0.006 (L) 09/16/2018   TSH <0.006 (L) 02/25/2018   TSH 0.01 (L) 08/21/2017   TSH 0.01 (L) 01/10/2017   TSH 0.01 (L) 12/26/2016   FREET4 2.37 (H) 05/11/2019   FREET4 1.53 12/19/2018   FREET4 2.14 (H) 11/18/2018   FREET4 1.69 09/16/2018   FREET4 1.63 02/25/2018   FREET4 1.4 08/21/2017   FREET4 1.3 12/26/2016   T3FREE 5.3 (H) 12/19/2018   T3FREE 3.8 12/26/2016   Her TSI antibodies were elevated at last visit, confirming the diagnosis of Graves' disease: Lab Results  Component Value Date   TSI 386 (H) 12/19/2018    Thyroid U/S (01/21/2017): Moderately heterogeneous thyroid of normal size, with 1 possible pseudonodule  Pt denies: - feeling nodules in neck - hoarseness - dysphagia - choking - SOB with lying down  She feels pain on L side of neck  when she lays down at night and this is bothersome for her.  She has anxiety.  She sees Dr. Adair Patter in the weight management clinic.  She has a history of prediabetes and was started on Metformin by Dr. Leafy Ro 11/2018.  Pt does not have a FH of thyroid ds. No FH of thyroid cancer. No h/o radiation tx to head or neck.  No seaweed or kelp. No recent contrast studies. No herbal supplements. No Biotin use. No recent steroids use.   She has a history of HTN-on HCTZ.  ROS: Constitutional: no weight gain/no weight loss, no fatigue, no subjective hyperthermia, no subjective hypothermia Eyes: no blurry vision, no xerophthalmia ENT: no sore throat, + see HPI Cardiovascular: no CP/no SOB/no palpitations/no leg swelling Respiratory: no cough/no SOB/no wheezing Gastrointestinal: no N/no V/no D/no C/no acid reflux Musculoskeletal: no muscle aches/no joint aches Skin: no rashes, no hair loss Neurological: no tremors/no numbness/no tingling/no dizziness  I reviewed pt's medications, allergies, PMH, social hx, family hx, and changes were documented in the history of present illness. Otherwise, unchanged from my initial visit note.  Past Medical History:  Diagnosis Date  . Anxiety   . Asthma   . Back pain   . Dyspnea   . HTN (hypertension)   . Joint pain   . Lower extremity edema   . Metabolic syndrome   .  Morbid obesity ([place])   . Prediabetes    Past Surgical History:  Procedure Laterality Date  . BREAST REDUCTION SURGERY  2008  . CESAREAN SECTION    . DILATION AND CURETTAGE OF UTERUS    . INDUCED ABORTION    . REDUCTION MAMMAPLASTY    . uterine ablation     Social History   Socioeconomic History  . Marital status: Single    Spouse name: Not on file  . Number of children: 2  . Years of education: Not on file  . Highest education level: Not on file  Occupational History  . Occupation: Exceptional Presenter, broadcasting: [employer]  Social Needs  . Financial  resource strain: Not on file  . Food insecurity    Worry: Not on file    Inability: Not on file  . Transportation needs    Medical: Not on file    Non-medical: Not on file  Tobacco Use  . Smoking status: Never Smoker  . Smokeless tobacco: Never Used  Substance and Sexual Activity  . Alcohol use: Not Currently    Comment: socially   . Drug use: No  . Sexual activity: Not Currently    Birth control/protection: Condom  Lifestyle  . Physical activity    Days per week: Not on file    Minutes per session: Not on file  . Stress: Not on file  Relationships  . Social Herbalist on phone: Not on file    Gets together: Not on file    Attends religious service: Not on file    Active member of club or organization: Not on file    Attends meetings of clubs or organizations: Not on file    Relationship status: Not on file  . Intimate partner violence    Fear of current or ex partner: Not on file    Emotionally abused: Not on file    Physically abused: Not on file    Forced sexual activity: Not on file  Other Topics Concern  . Not on file  Social History Narrative  . Not on file   Current Outpatient Medications on File Prior to Visit  Medication Sig Dispense Refill  . albuterol (PROVENTIL HFA;VENTOLIN HFA) 108 (90 Base) MCG/ACT inhaler Inhale 2 puffs into the lungs every 6 (six) hours as needed for wheezing or shortness of breath. 1 Inhaler 1  . buPROPion (WELLBUTRIN SR) 200 MG 12 hr tablet Take 1 tablet (200 mg total) by mouth daily. 30 tablet 0  . Fluticasone-Salmeterol (ADVAIR DISKUS) 100-50 MCG/DOSE AEPB Inhale 1 puff into the lungs 2 (two) times daily. 60 each 1  . hydrochlorothiazide (HYDRODIURIL) 25 MG tablet TAKE 1 TABLET(25 MG) BY MOUTH DAILY 90 tablet 1  . meclizine (ANTIVERT) 25 MG tablet TAKE 1 TABLET(25 MG) BY MOUTH TWICE DAILY AS NEEDED FOR DIZZINESS 30 tablet 0  . methimazole (TAPAZOLE) 5 MG tablet Take 1 tablet (5 mg total) by mouth 2 (two) times daily. 120  tablet 1  . Vitamin D, Ergocalciferol, (DRISDOL) 1.25 MG (50000 UT) CAPS capsule Take 1 capsule (50,000 Units total) by mouth every 7 (seven) days. 4 capsule 0   No current facility-administered medications on file prior to visit.    Allergies  Allergen Reactions  . Compazine [Prochlorperazine Edisylate]    Family History  Problem Relation Age of Onset  . Hypertension Mother   . Obesity Mother   . Hypertension Father   . Diabetes Father   .  Diabetes Sister   . Stroke Sister 48  . Breast cancer Neg Hx     PE: BP 118/60   Pulse 82   Ht 5\' 1"  (1.549 m)   Wt 252 lb (114.3 kg)   SpO2 97%   BMI 47.61 kg/m  Wt Readings from Last 3 Encounters:  07/14/19 252 lb (114.3 kg)  05/11/19 257 lb 4.8 oz (116.7 kg)  12/19/18 260 lb (117.9 kg)   Constitutional: overweight, in NAD Eyes: PERRLA, EOMI, no exophthalmos, no lid lag, no stare ENT: moist mucous membranes, + mild symmetric thyromegaly, no thyroid bruits, no cervical lymphadenopathy Cardiovascular: RRR, No RG, +1/6 SEM Respiratory: CTA B Gastrointestinal: abdomen soft, NT, ND, BS+ Musculoskeletal: no deformities, strength intact in all 4 Skin: moist, warm, no rashes, + acanthosis nigricans on neck, + skin tags on neck Neurological: no tremor with outstretched hands, DTR normal in all 4  ASSESSMENT: 1.  Graves' disease  PLAN:  1. Patient with a long history of untreated thyrotoxicosis since at least 2018, without thyrotoxic symptoms: Weight loss, heat intolerance, hyper defecation, palpitations, anxiety.  However, at last visit, she did complain of decreased energy and hair loss.  At that time, we checked her TFTs and they were still quite thyrotoxic and TSI's were elevated pointing towards Graves' disease.  Therefore, we bypassed a thyroid uptake and scan and started her on methimazole 5 mg twice a day with plans to check labs in 1.5 months after starting the medication.  However, she did not present for these and she finally had  labs in 05/2019 by PCP.  These were worse.  At that time, I advised her to increase methimazole to 5 mg twice a day as she admitted to missing her second dose of methimazole of the day.  She continues on this dose now. -We discussed that every time we change the methimazole dose, we will need to have her back for labs in 5 to 6 weeks -At this visit, we discussed about her diagnosis of Graves' disease since we diagnosed this after I saw her last.  I explained that this is an autoimmune disease in which her immune system attacks her own thyroid. -We again discussed about possible modalities of treatment for her Graves' disease, to include continuing methimazole, RAI treatment, and, last resort, surgery. She has neck pain, which bothers her at night, and we discussed the methimazole will likely not help with this, I suggested RAI treatment.  I explained in detail about the protocol for RAI treatment, and preceding investigation and the risk of developing hypothyroidism afterwards.  She does agree to proceed with this.  -At this visit, we will check her TSH, free T4, free T3 and see if we need to adjust methimazole dose.  I advised her to stop the methimazole before the thyroid uptake and scan (required for RAI treatment), then restart it and, finally, stop it definitively 5 days prior to her RAI treatment. -I do not feel we need to add beta-blockers at this time, since she is not tachycardic or tremulous -No signs of Graves' ophthalmopathy: She denies double vision, blurry vision, eye pain, chemosis. -I will see her back in 3 months, but will need labs 1 month after her RAI treatment.  - time spent with the patient: 25 minutes, of which >50% was spent in obtaining information about her symptoms, reviewing her previous labs, evaluations, and treatments, counseling her about her condition (please see the discussed topics above), and developing a plan to further investigate  and treat it; she had a number of  questions which I addressed.I will see her back in 4 months, but likely sooner for repeat labs.  Needs refills.  Component     Latest Ref Rng & Units 07/14/2019  TSH     0.35 - 4.50 uIU/mL <0.01 (L)  T4,Free(Direct)     0.60 - 1.60 ng/dL 1.34  Triiodothyronine,Free,Serum     2.3 - 4.2 pg/mL 3.8   TSH is still undetectable, while the free T4 and free T3 are now normal.  I would still suggest to go ahead with RAI treatment due to long history of thyrotoxicosis, neck discomfort, and h/o noncompliance with methimazole.  Narrative & Impression    CLINICAL DATA:  Graves disease, thyrotoxicosis, difficulty sleeping, nervousness and anxiousness for 2 years, neck swelling and tenderness, increased appetite, irritability and mood venous, increased thirst cyst, heart palpitations and fluttering, hypertension  EXAM: THYROID SCAN AND UPTAKE - 4 AND 24 HOURS  TECHNIQUE: Following oral administration of I-123 capsule, anterior planar imaging was acquired at 24 hours. Thyroid uptake was calculated with a thyroid probe at 4-6 hours and 24 hours.  RADIOPHARMACEUTICALS:  449 uCi I-123 sodium iodide p.o.  COMPARISON:  None  FINDINGS: Decreased resolution of imaging due to claustrophobia, camera placed over 1 foot from patient. Homogeneous increased tracer accumulation in both thyroid lobes. No focal areas of increased or decreased tracer localization.  4 hour I-123 uptake = 39%% (normal 5-20%) 24 hour I-123 uptake = 60%% (normal 10-30%)  IMPRESSION: Elevated 4 hour and 24 hour radio iodine uptakes consistent with hyperthyroidism. No focal scintigraphic abnormalities on imaging. Findings consistent with Graves disease.  Electronically Signed   By: Lavonia Dana M.D.   On: 07/28/2019 13:35    We will go ahead with RAI treatment for her Graves' disease, as discussed.  Philemon Kingdom, MD PhD [facility]

## 2019-07-14 NOTE — Patient Instructions (Addendum)
Please stop at the lab.  Please stay on Methimazole 5 mg 2x a day for now.  Plan is for a Thyroid Uptake and scan next, and then RAI treatment.  Before the Scan, please stop Methimazole for 5 days. Resume it after the scan, but stop it again before the treatment.   We will need thyroid labs in 4-5 weeks after the RAI treatment.  Please return in 3 months.

## 2019-07-15 LAB — T3, FREE: T3, Free: 3.8 pg/mL (ref 2.3–4.2)

## 2019-07-15 LAB — TSH: TSH: <0.01 u[IU]/mL — ABNORMAL LOW (ref 0.35–4.50)

## 2019-07-15 LAB — T4, FREE: Free T4: 1.34 ng/dL (ref 0.60–1.60)

## 2019-07-16 ENCOUNTER — Telehealth (INDEPENDENT_AMBULATORY_CARE_PROVIDER_SITE_OTHER): Payer: BC Managed Care – PPO | Admitting: Family Medicine

## 2019-07-16 ENCOUNTER — Other Ambulatory Visit: Payer: Self-pay

## 2019-07-16 DIAGNOSIS — E0590 Thyrotoxicosis, unspecified without thyrotoxic crisis or storm: Secondary | ICD-10-CM | POA: Diagnosis not present

## 2019-07-16 DIAGNOSIS — Z6841 Body Mass Index (BMI) 40.0 and over, adult: Secondary | ICD-10-CM | POA: Diagnosis not present

## 2019-07-16 DIAGNOSIS — E559 Vitamin D deficiency, unspecified: Secondary | ICD-10-CM | POA: Diagnosis not present

## 2019-07-20 NOTE — Progress Notes (Signed)
Office: [phone]  /  Fax: [phone] TeleHealth Visit:  Kim Tanner has verbally consented to this TeleHealth visit today. The patient is located at home, the provider is located at the [employer] [facility] office. The participants in this visit include the listed provider and patient. The visit was conducted today via face time.  HPI:   Chief Complaint: OBESITY Kim Tanner is here to discuss her progress with her obesity treatment plan. She is on the keep a food journal with 1250 calories and 85+ grams of protein daily or follow the Category 2 plan and is following her eating plan approximately 70 % of the time. She states she is walking for 90 minutes 2 times per week, and workout videos for 20 minutes 3 times per weeks. Kim Tanner is working from home only sporadically. She was on the plan until last weekend (birthday weekend), and overindulged in fried food and cake. She did have stomach upset the following day. She is not doing any bread or eggs, but making substitutes for previously not focusing on protein amount. She has radioactive iodine treatments planned.  We were unable to weigh the patient today for this TeleHealth visit. She feels as if she has maintained her weight since her last visit. She has lost 6 lbs since starting treatment with [name].  Thyrotoxicosis without Thyroid Kim Tanner sees Dr. Cruzita Lederer. She has plans for radioactive iodine treatments in the upcoming month. She is taking medications consistently now.  Vitamin D Deficiency Kim Tanner has a diagnosis of vitamin D deficiency. She is currently taking prescription Vit D. She notes fatigue and denies nausea, vomiting or muscle weakness.  ASSESSMENT AND PLAN:  Thyrotoxicosis without thyroid storm, unspecified thyrotoxicosis type  Vitamin D deficiency  Class 3 severe obesity with serious comorbidity and body mass index (BMI) of 45.0 to 49.9 in adult, unspecified obesity type [facility])  PLAN:  Thyrotoxicosis  without Thyroid Kim Tanner is to follow up with Dr. Cruzita Lederer after her scans on October 28th and 29th. Kim Tanner agrees to follow up with our clinic in 3 weeks.  Vitamin D Deficiency Kim Tanner was informed that low vitamin D levels contributes to fatigue and are associated with obesity, breast, and colon cancer. Kim Tanner agrees to continue taking prescription Vit D 50,000 IU every week, no refill needed. She will follow up for routine testing of vitamin D, at least 2-3 times per year. She was informed of the risk of over-replacement of vitamin D and agrees to not increase her dose unless she discusses this with [name] first. Kim Tanner agrees to follow up with our clinic in 3 weeks.  Obesity Kim Tanner is currently in the action stage of change. As such, her goal is to continue with weight loss efforts She has agreed to follow the Category 2 plan Kim Tanner has been instructed to work up to a goal of 150 minutes of combined cardio and strengthening exercise per week for weight loss and overall health benefits. We discussed the following Behavioral Modification Strategies today: increasing lean protein intake, increasing vegetables and work on meal planning and easy cooking plans, keeping healthy foods in the home, and planning for success   Kim Tanner has agreed to follow up with our clinic in 3 weeks. She was informed of the importance of frequent follow up visits to maximize her success with intensive lifestyle modifications for her multiple health conditions.  ALLERGIES: Allergies  Allergen Reactions  . Compazine [Prochlorperazine Edisylate]     MEDICATIONS: Current Outpatient Medications on File Prior to Visit  Medication Sig Dispense Refill  . albuterol (PROVENTIL HFA;VENTOLIN HFA) 108 (90 Base) MCG/ACT inhaler Inhale 2 puffs into the lungs every 6 (six) hours as needed for wheezing or shortness of breath. 1 Inhaler 1  . Fluticasone-Salmeterol (ADVAIR DISKUS) 100-50 MCG/DOSE AEPB Inhale 1 puff into the  lungs 2 (two) times daily. 60 each 1  . hydrochlorothiazide (HYDRODIURIL) 25 MG tablet TAKE 1 TABLET(25 MG) BY MOUTH DAILY 90 tablet 1  . meclizine (ANTIVERT) 25 MG tablet TAKE 1 TABLET(25 MG) BY MOUTH TWICE DAILY AS NEEDED FOR DIZZINESS 30 tablet 0  . methimazole (TAPAZOLE) 5 MG tablet Take 1 tablet (5 mg total) by mouth 2 (two) times daily. 120 tablet 1  . Vitamin D, Ergocalciferol, (DRISDOL) 1.25 MG (50000 UT) CAPS capsule Take 1 capsule (50,000 Units total) by mouth every 7 (seven) days. 4 capsule 0   No current facility-administered medications on file prior to visit.     PAST MEDICAL HISTORY: Past Medical History:  Diagnosis Date  . Anxiety   . Asthma   . Back pain   . Dyspnea   . HTN (hypertension)   . Joint pain   . Lower extremity edema   . Metabolic syndrome   . Morbid obesity ([place])   . Prediabetes     PAST SURGICAL HISTORY: Past Surgical History:  Procedure Laterality Date  . BREAST REDUCTION SURGERY  2008  . CESAREAN SECTION    . DILATION AND CURETTAGE OF UTERUS    . INDUCED ABORTION    . REDUCTION MAMMAPLASTY    . uterine ablation      SOCIAL HISTORY: Social History   Tobacco Use  . Smoking status: Never Smoker  . Smokeless tobacco: Never Used  Substance Use Topics  . Alcohol use: Not Currently    Comment: socially   . Drug use: No    FAMILY HISTORY: Family History  Problem Relation Age of Onset  . Hypertension Mother   . Obesity Mother   . Hypertension Father   . Diabetes Father   . Diabetes Sister   . Stroke Sister 70  . Breast cancer Neg Hx     ROS: Review of Systems  Constitutional: Positive for malaise/fatigue. Negative for weight loss.  Gastrointestinal: Negative for nausea and vomiting.  Musculoskeletal:       Negative muscle weakness    PHYSICAL EXAM: Pt in no acute distress  RECENT LABS AND TESTS: BMET    Component Value Date/Time   NA 139 05/11/2019 0900   K 3.8 05/11/2019 0900   CL 99 05/11/2019 0900   CO2 25  05/11/2019 0900   GLUCOSE 107 (H) 05/11/2019 0900   GLUCOSE 114 (H) 08/21/2017 1009   BUN 9 05/11/2019 0900   CREATININE 0.88 05/11/2019 0900   CREATININE 0.77 08/21/2017 1009   CALCIUM 9.6 05/11/2019 0900   GFRNONAA 80 05/11/2019 0900   GFRNONAA 94 08/21/2017 1009   GFRAA 92 05/11/2019 0900   GFRAA 109 08/21/2017 1009   Lab Results  Component Value Date   HGBA1C 5.6 05/11/2019   HGBA1C 5.5 11/18/2018   HGBA1C 5.8 (H) 09/16/2018   HGBA1C 5.5 02/25/2018   HGBA1C 5.8% 11/11/2017   Lab Results  Component Value Date   INSULIN 17.7 11/18/2018   CBC    Component Value Date/Time   WBC 6.4 05/11/2019 0900   WBC 6.4 08/21/2017 1009   RBC 4.33 05/11/2019 0900   RBC 4.23 08/21/2017 1009   HGB 12.2 05/11/2019 0900   HCT 35.5 05/11/2019 0900  PLT 264 05/11/2019 0900   MCV 82 05/11/2019 0900   MCH 28.2 05/11/2019 0900   MCH 27.9 08/21/2017 1009   MCHC 34.4 05/11/2019 0900   MCHC 33.9 08/21/2017 1009   RDW 14.1 05/11/2019 0900   LYMPHSABS 1.8 05/11/2019 0900   MONOABS 588 12/26/2016 1525   EOSABS 0.1 05/11/2019 0900   BASOSABS 0.0 05/11/2019 0900   Iron/TIBC/Ferritin/ %Sat No results found for: IRON, TIBC, FERRITIN, IRONPCTSAT Lipid Panel     Component Value Date/Time   CHOL 182 05/11/2019 0900   TRIG 103 05/11/2019 0900   HDL 42 05/11/2019 0900   CHOLHDL 4.3 05/11/2019 0900   CHOLHDL 3.6 01/10/2017 0823   VLDL 12 01/10/2017 0823   LDLCALC 119 (H) 05/11/2019 0900   Hepatic Function Panel     Component Value Date/Time   PROT 6.8 05/11/2019 0900   ALBUMIN 4.1 05/11/2019 0900   AST 19 05/11/2019 0900   ALT 20 05/11/2019 0900   ALKPHOS 116 05/11/2019 0900   BILITOT 0.4 05/11/2019 0900      Component Value Date/Time   TSH <0.01 (L) 07/14/2019 1550   TSH <0.005 (L) 05/11/2019 0900   TSH <0.01 (L) 12/19/2018 0854      I, Trixie Dredge, am acting as transcriptionist for Ilene Qua, MD  I have reviewed the above documentation for accuracy and  completeness, and I agree with the above. - Ilene Qua, MD

## 2019-07-27 ENCOUNTER — Ambulatory Visit (HOSPITAL_COMMUNITY)
Admission: RE | Admit: 2019-07-27 | Discharge: 2019-07-27 | Disposition: A | Discharge status: Home / Self Care | Payer: BC Managed Care – PPO | Source: Ambulatory Visit | Attending: Internal Medicine | Admitting: Internal Medicine

## 2019-07-27 ENCOUNTER — Other Ambulatory Visit: Payer: Self-pay

## 2019-07-27 ENCOUNTER — Encounter (HOSPITAL_COMMUNITY)
Admission: RE | Admit: 2019-07-27 | Discharge: 2019-07-27 | Disposition: A | Discharge status: Home / Self Care | Payer: BC Managed Care – PPO | Source: Ambulatory Visit | Attending: Internal Medicine | Admitting: Internal Medicine

## 2019-07-27 DIAGNOSIS — E0500 Thyrotoxicosis with diffuse goiter without thyrotoxic crisis or storm: Secondary | ICD-10-CM

## 2019-07-27 DIAGNOSIS — I10 Essential (primary) hypertension: Secondary | ICD-10-CM | POA: Diagnosis not present

## 2019-07-27 MED ORDER — SODIUM IODIDE I-123 7.4 MBQ CAPS
449.0000 | ORAL_CAPSULE | Freq: Once | ORAL | Status: AC
  Administered 2019-07-27: 09:00:00 449 via ORAL

## 2019-07-28 ENCOUNTER — Encounter (HOSPITAL_COMMUNITY)
Admission: RE | Admit: 2019-07-28 | Discharge: 2019-07-28 | Disposition: A | Discharge status: Home / Self Care | Payer: BC Managed Care – PPO | Source: Ambulatory Visit | Attending: Internal Medicine | Admitting: Internal Medicine

## 2019-07-28 ENCOUNTER — Telehealth: Payer: Self-pay | Admitting: Internal Medicine

## 2019-07-28 NOTE — Telephone Encounter (Signed)
Patient called to confirm that out going referral had been set up. Confirmed and advised 7-10 business

## 2019-07-28 NOTE — Addendum Note (Signed)
Addended by: Philemon Kingdom on: 07/28/2019 01:45 PM   Modules accepted: Orders

## 2019-08-21 ENCOUNTER — Ambulatory Visit (HOSPITAL_COMMUNITY)
Admission: RE | Admit: 2019-08-21 | Discharge: 2019-08-21 | Disposition: A | Discharge status: Home / Self Care | Payer: BC Managed Care – PPO | Source: Ambulatory Visit | Attending: Internal Medicine | Admitting: Internal Medicine

## 2019-08-21 ENCOUNTER — Other Ambulatory Visit: Payer: Self-pay

## 2019-08-21 DIAGNOSIS — E0500 Thyrotoxicosis with diffuse goiter without thyrotoxic crisis or storm: Secondary | ICD-10-CM

## 2019-08-21 LAB — HCG, SERUM, QUALITATIVE: Preg, Serum: NEGATIVE

## 2019-08-21 MED ORDER — SODIUM IODIDE I 131 CAPSULE
16.1000 | Freq: Once | INTRAVENOUS | Status: AC | PRN
  Administered 2019-08-21: 16.1 via ORAL

## 2019-10-14 ENCOUNTER — Ambulatory Visit: Payer: BC Managed Care – PPO | Admitting: Family Medicine

## 2019-10-20 ENCOUNTER — Encounter: Payer: Self-pay | Admitting: Internal Medicine

## 2019-10-20 ENCOUNTER — Ambulatory Visit (INDEPENDENT_AMBULATORY_CARE_PROVIDER_SITE_OTHER): Payer: BC Managed Care – PPO | Admitting: Internal Medicine

## 2019-10-20 ENCOUNTER — Other Ambulatory Visit: Payer: Self-pay

## 2019-10-20 VITALS — BP 128/88 | HR 80 | Ht 61.0 in | Wt 257.0 lb

## 2019-10-20 DIAGNOSIS — E0500 Thyrotoxicosis with diffuse goiter without thyrotoxic crisis or storm: Secondary | ICD-10-CM | POA: Diagnosis not present

## 2019-10-20 NOTE — Patient Instructions (Signed)
Please stop at the lab.  Stay off Methimazole.  Please return in 3-4 months.

## 2019-10-20 NOTE — Progress Notes (Signed)
Patient ID: Kim Tanner, female   DOB: [birth year], 49 y.o.   MRN: [id number]   This visit occurred during the SARS-CoV-2 public health emergency.  Safety protocols were in place, including screening questions prior to the visit, additional usage of staff PPE, and extensive cleaning of exam room while observing appropriate contact time as indicated for disinfecting solutions.   HPI  Kim Tanner is a 49 y.o.-year-old female, initially referred by her PCP,Henson, Vickie L, NP-C, returning for follow-up for thyrotoxicosis, diagnosed as Graves' disease after last visit.  Last visit 3 months ago.  Reviewed and addended history: Patient was diagnosed with thyrotoxicosis in 11/2016.  She was referred to endocrinology then, but could not come at that time, and only got to see me for the first time in 11/2018.  At last visit, her TFTs were still thyrotoxic and her TSI antibodies were elevated, confirming Graves' disease.  We started methimazole 5 mg twice a day, but she did not come back for labs 1.5 months later...  She had another set of TFTs by PCP in 05/2019 and these appeared worse.    She tells me that before her labs in 05/2019, she was missing 2nd dose of MMI at the day.  We contacted her to start taking the medication consistently.  At last visit she was taking 5 mg twice a day.  Since then, we checked a thyroid uptake and scan (07/28/2019) and this was positive for Graves' disease: 4 hour I-123 uptake = 39%% (normal 5-20%) 24 hour I-123 uptake = 60%% (normal 10-30%)  IMPRESSION: Elevated 4 hour and 24 hour radio iodine uptakes consistent with hyperthyroidism. No focal scintigraphic abnormalities on imaging. Findings consistent with Graves disease.  She had RAI treatment (08/31/2019).  Reviewed her TFTs: Lab Results  Component Value Date   TSH <0.01 (L) 07/14/2019   TSH <0.005 (L) 05/11/2019   TSH <0.01 (L) 12/19/2018   TSH <0.006 (L) 11/18/2018   TSH <0.006 (L) 09/16/2018    TSH <0.006 (L) 02/25/2018   TSH 0.01 (L) 08/21/2017   TSH 0.01 (L) 01/10/2017   TSH 0.01 (L) 12/26/2016   FREET4 1.34 07/14/2019   FREET4 2.37 (H) 05/11/2019   FREET4 1.53 12/19/2018   FREET4 2.14 (H) 11/18/2018   FREET4 1.69 09/16/2018   FREET4 1.63 02/25/2018   FREET4 1.4 08/21/2017   FREET4 1.3 12/26/2016   T3FREE 3.8 07/14/2019   T3FREE 5.3 (H) 12/19/2018   T3FREE 3.8 12/26/2016   Her TSI's were elevated, confirming Graves' disease Lab Results  Component Value Date   TSI 386 (H) 12/19/2018    Thyroid U/S (01/21/2017): Moderately heterogeneous thyroid of normal size, with 1.0 cm nodule  Pt mentions: - pressure in neck - after RAI tx  No - hoarseness - dysphagia - choking - SOB with lying down  No FH of thyroid disease or thyroid cancer. No h/o radiation tx to head or neck other than RAI treatment, as mentioned above.  No herbal supplements. No Biotin use. No recent steroids use.   She feels pain on L side of neck when she lays down at night and this is bothersome for her.  She has a history of anxiety.  She sees Dr. Adair Patter in the weight management clinic.  She has a history of prediabetes and was started on Metformin by Dr. Leafy Ro 11/2018.  She has a history of HTN-on HCTZ.  ROS: Constitutional: no weight gain/no weight loss, no fatigue, no subjective hyperthermia, no subjective hypothermia Eyes: no blurry vision, no  xerophthalmia ENT: no sore throat, + see HPI Cardiovascular: no CP/no SOB/no palpitations/no leg swelling Respiratory: no cough/no SOB/no wheezing Gastrointestinal: no N/no V/no D/no C/no acid reflux Musculoskeletal: no muscle aches/no joint aches Skin: no rashes, no hair loss Neurological: no tremors/no numbness/no tingling/no dizziness  I reviewed pt's medications, allergies, PMH, social hx, family hx, and changes were documented in the history of present illness. Otherwise, unchanged from my initial visit note.  Past Medical History:   Diagnosis Date  . Anxiety   . Asthma   . Back pain   . Dyspnea   . HTN (hypertension)   . Joint pain   . Lower extremity edema   . Metabolic syndrome   . Morbid obesity ([place])   . Prediabetes    Past Surgical History:  Procedure Laterality Date  . BREAST REDUCTION SURGERY  2008  . CESAREAN SECTION    . DILATION AND CURETTAGE OF UTERUS    . INDUCED ABORTION    . REDUCTION MAMMAPLASTY    . uterine ablation     Social History   Socioeconomic History  . Marital status: Single    Spouse name: Not on file  . Number of children: 2  . Years of education: Not on file  . Highest education level: Not on file  Occupational History  . 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    Employer: [employer]  Tobacco Use  . Smoking status: Never Smoker  . Smokeless tobacco: Never Used  Substance and Sexual Activity  . Alcohol use: Not Currently    Comment: socially   . Drug use: No  . Sexual activity: Not Currently    Birth control/protection: Condom  Other Topics Concern  . Not on file  Social History Narrative  . Not on file   Social Determinants of Health   Financial Resource Strain:   . Difficulty of Paying Living Expenses: Not on file  Food Insecurity:   . Worried About Charity fundraiser in the Last Year: Not on file  . Ran Out of Food in the Last Year: Not on file  Transportation Needs:   . Lack of Transportation (Medical): Not on file  . Lack of Transportation (Non-Medical): Not on file  Physical Activity:   . Days of Exercise per Week: Not on file  . Minutes of Exercise per Session: Not on file  Stress:   . Feeling of Stress : Not on file  Social Connections:   . Frequency of Communication with Friends and Family: Not on file  . Frequency of Social Gatherings with Friends and Family: Not on file  . Attends Religious Services: Not on file  . Active Member of Clubs or Organizations: Not on file  . Attends Archivist Meetings: Not on file  .  Marital Status: Not on file  Intimate Partner Violence:   . Fear of Current or Ex-Partner: Not on file  . Emotionally Abused: Not on file  . Physically Abused: Not on file  . Sexually Abused: Not on file   Current Outpatient Medications on File Prior to Visit  Medication Sig Dispense Refill  . albuterol (PROVENTIL HFA;VENTOLIN HFA) 108 (90 Base) MCG/ACT inhaler Inhale 2 puffs into the lungs every 6 (six) hours as needed for wheezing or shortness of breath. 1 Inhaler 1  . Fluticasone-Salmeterol (ADVAIR DISKUS) 100-50 MCG/DOSE AEPB Inhale 1 puff into the lungs 2 (two) times daily. 60 each 1  . hydrochlorothiazide (HYDRODIURIL) 25 MG tablet TAKE 1 TABLET(25 MG) BY MOUTH  DAILY 90 tablet 1  . meclizine (ANTIVERT) 25 MG tablet TAKE 1 TABLET(25 MG) BY MOUTH TWICE DAILY AS NEEDED FOR DIZZINESS 30 tablet 0  . methimazole (TAPAZOLE) 5 MG tablet Take 1 tablet (5 mg total) by mouth 2 (two) times daily. 120 tablet 1  . Vitamin D, Ergocalciferol, (DRISDOL) 1.25 MG (50000 UT) CAPS capsule Take 1 capsule (50,000 Units total) by mouth every 7 (seven) days. 4 capsule 0   No current facility-administered medications on file prior to visit.   Allergies  Allergen Reactions  . Compazine [Prochlorperazine Edisylate]    Family History  Problem Relation Age of Onset  . Hypertension Mother   . Obesity Mother   . Hypertension Father   . Diabetes Father   . Diabetes Sister   . Stroke Sister 82  . Breast cancer Neg Hx    PE: BP 128/88   Pulse 80   Ht 5\' 1"  (1.549 m)   Wt 257 lb (116.6 kg)   SpO2 97%   BMI 48.56 kg/m  Wt Readings from Last 3 Encounters:  10/20/19 257 lb (116.6 kg)  07/14/19 252 lb (114.3 kg)  05/11/19 257 lb 4.8 oz (116.7 kg)   Constitutional: overweight, in NAD Eyes: PERRLA, EOMI, no exophthalmos, no lid lag, no stare ENT: moist mucous membranes, + mild symmetric thyromegaly, no thyroid bruits, no cervical lymphadenopathy Cardiovascular: RRR, No RG, 1/6 SEM Respiratory: CTA  B Gastrointestinal: abdomen soft, NT, ND, BS+ Musculoskeletal: no deformities, strength intact in all 4 Skin: moist, warm, no rashes, + acanthosis nigricans on neck, + skin tags on neck Neurological: no tremor with outstretched hands, DTR normal in all 4  ASSESSMENT: 1.  Graves' disease - now s/p RAI Tx performed since last visit  PLAN:  1. Patient with a long history of untreated thyrotoxicosis since at least 2018, without thyrotoxic symptoms.  She denied weight loss, heat intolerance, hydrodelineation, palpitations, anxiety.  However, she did complain of decreased energy and hair loss.  We checked her TSI's in the past and they were elevated, pointing towards Graves' disease.  Restarted methimazole but she was not completely compliant with the medications and labs and her TFTs remained abnormal.  At last visit, we again discussed about options for treatment and I suggested RAI treatment.  We first obtain a thyroid uptake and scan in 07/2019 and this showed Graves' disease and no cold nodules.  She had RAI treatment on 08/31/2019.  I reviewed all of the reports of the imaging studies and procedures since last visit along with the patient. -She feels well after the treatment, without complaints -We discussed that usually we need to start after thyroid ablation.  I advised her that if we need to start levothyroxine, she needs to take the thyroid hormone every day, with water, at least 30 minutes before breakfast, separated by at least 4 hours from: - acid reflux medications - calcium - iron - multivitamins -We also discussed that after every change in her levothyroxine dose, we need to check labs approximately 1.5 months afterwards -At this visit, we will check her TFTs again -Of note, no signs of Graves' ophthalmopathy: No double vision, blurry vision, eye pain, chemosis -I will see her back in 3 months or possibly sooner for labs   Orders Placed This Encounter  Procedures  . TSH  . T4,  free  . T3, free   Philemon Kingdom, MD PhD [facility] Endocrinology

## 2019-10-21 ENCOUNTER — Other Ambulatory Visit: Payer: BC Managed Care – PPO

## 2019-11-10 NOTE — Progress Notes (Signed)
Subjective:    Patient ID: Kim Tanner, female    DOB: 05/09/1971, 49 y.o.   MRN: [id number]  HPI Chief Complaint  Patient presents with  . med check    med check    She is here for a medication management visit.   She is being followe by Dr. Adair Patter at [facility] and Dr. Cruzita Lederer for thyroid disorder. States she is not working currently due to taking care of her mother.  States she has not seen Dr. Adair Patter or Leafy Ro in several months.  Reports gaining weight due to unhealthy diet and not exercising lately.  States she stopped drinking soda and has increased snacking, such as cookies.  Asthma- is not using Advair. States she does not need it. Usually she needs it in the winter time but not this year.  States she has not needed albuterol   HTN- taking HCTZ daily. BP at home is ranging between 127-155/80s. Does not use extra salt. Cooking more at home.   Prediabetes- last Hgb A1c 5.6% and normal on 05/11/2019  Does not want to have this rechecked today.   HL-  LDL 119 on 05/11/2019 Eating fewer eggs and thinks this will help   Vitamin D deficiency- has not had follow up and is not on a supplement.    LMP: irregular  [facility] sees [facility]   Denies fever, chills, dizziness, chest pain, palpitations, shortness of breath, abdominal pain, N/V/D, urinary symptoms, LE edema.   Reviewed allergies, medications, past medical, surgical, family, and social history.    Review of Systems Pertinent positives and negatives in the history of present illness.     Objective:   Physical Exam BP 140/84   Pulse 75   Temp (!) 97.1 F (36.2 C)   Wt 261 lb 9.6 oz (118.7 kg)   BMI 49.43 kg/m   Alert and in no distress.  Cardiac exam shows a regular rhythm without murmurs or gallops. Lungs are clear to auscultation.      Assessment & Plan:  Essential hypertension - Plan: CBC with Differential/Platelet, Comprehensive metabolic panel -Blood pressure is not in  goal range.  We discussed keeping a closer eye on this over the next 4 weeks.  Discussed paying closer attention to her sodium intake, eating a healthy diet and increasing physical activity.  Discussed long-term health consequences associated with uncontrolled hypertension.  She will continue on HCTZ 25 mg and follow-up in 4 weeks with her readings from home.  States she cannot take amlodipine so if we need to consider another class of medication, preferably not this.  Mild intermittent asthma without complication -Denies having any issues and is not currently taking Advair or using albuterol.  Prediabetes - Plan: Comprehensive metabolic panel -Hemoglobin A1c was normal in August 2020.  She declines having this checked today.  Asymptomatic  Pure hypercholesterolemia -LDL in August 2020 was elevated at 119.  Discussed rechecking this at her annual physical.  Counseling on healthy diet and exercise  Morbid obesity ([place]) -Encouraged her to call and follow-up with  weight management since she has not heard from them in several months.  Reports weight gain associated with eating comfort foods during the pandemic and working from home and thus exercise.  Encouraged her to turn the corner and work on [employer] and increasing physical activity  Vitamin D deficiency - Plan: VITAMIN D 25 Hydroxy (Vit-D Deficiency, Fractures) -Is not currently on a supplement.  I will recheck her vitamin D level  today and start her back on vitamin D if appropriate.  Graves disease - Plan: TSH, T3, T4, free -States she is supposed to have labs done per Dr. Cruzita Lederer for her thyroid function and since she is having blood work here today, she requests that I go ahead and check her thyroid function and forward it to her endocrinologist. This is managed by Cruzita Lederer.   Benign paroxysmal positional vertigo, unspecified laterality - Plan: meclizine (ANTIVERT) 25 MG tablet -Reports having periodic spells and takes  meclizine as needed.  Requests refill today.

## 2019-11-11 ENCOUNTER — Other Ambulatory Visit: Payer: Self-pay

## 2019-11-11 ENCOUNTER — Ambulatory Visit (INDEPENDENT_AMBULATORY_CARE_PROVIDER_SITE_OTHER): Payer: BC Managed Care – PPO | Admitting: Family Medicine

## 2019-11-11 ENCOUNTER — Encounter: Payer: Self-pay | Admitting: Family Medicine

## 2019-11-11 VITALS — BP 140/84 | HR 75 | Temp 97.1°F | Wt 261.6 lb

## 2019-11-11 DIAGNOSIS — R7303 Prediabetes: Secondary | ICD-10-CM

## 2019-11-11 DIAGNOSIS — J4520 Mild intermittent asthma, uncomplicated: Secondary | ICD-10-CM | POA: Diagnosis not present

## 2019-11-11 DIAGNOSIS — E7800 Pure hypercholesterolemia, unspecified: Secondary | ICD-10-CM | POA: Diagnosis not present

## 2019-11-11 DIAGNOSIS — I10 Essential (primary) hypertension: Secondary | ICD-10-CM | POA: Diagnosis not present

## 2019-11-11 DIAGNOSIS — E559 Vitamin D deficiency, unspecified: Secondary | ICD-10-CM

## 2019-11-11 DIAGNOSIS — H8110 Benign paroxysmal vertigo, unspecified ear: Secondary | ICD-10-CM

## 2019-11-11 DIAGNOSIS — E0500 Thyrotoxicosis with diffuse goiter without thyrotoxic crisis or storm: Secondary | ICD-10-CM

## 2019-11-11 MED ORDER — MECLIZINE HCL 25 MG PO TABS: ORAL_TABLET | ORAL | 0 refills | Status: DC

## 2019-11-11 NOTE — Patient Instructions (Signed)
GOAL BP IS 130/80 OR LESS.   Keep an eye on your BP at home and let me know if your readings are not in goal range.   Increase your physical activity and eat a healthier diet.    DASH Eating Plan DASH stands for "Dietary Approaches to Stop Hypertension." The DASH eating plan is a healthy eating plan that has been shown to reduce high blood pressure (hypertension). It may also reduce your risk for type 2 diabetes, heart disease, and stroke. The DASH eating plan may also help with weight loss. What are tips for following this plan?  General guidelines  Avoid eating more than 2,300 mg (milligrams) of salt (sodium) a day. If you have hypertension, you may need to reduce your sodium intake to 1,500 mg a day.  Limit alcohol intake to no more than 1 drink a day for nonpregnant women and 2 drinks a day for men. One drink equals 12 oz of beer, 5 oz of wine, or 1 oz of hard liquor.  Work with your health care provider to maintain a healthy body weight or to lose weight. Ask what an ideal weight is for you.  Get at least 30 minutes of exercise that causes your heart to beat faster (aerobic exercise) most days of the week. Activities may include walking, swimming, or biking.  Work with your health care provider or diet and nutrition specialist (dietitian) to adjust your eating plan to your individual calorie needs. Reading food labels   Check food labels for the amount of sodium per serving. Choose foods with less than 5 percent of the Daily Value of sodium. Generally, foods with less than 300 mg of sodium per serving fit into this eating plan.  To find whole grains, look for the word "whole" as the first word in the ingredient list. Shopping  Buy products labeled as "low-sodium" or "no salt added."  Buy fresh foods. Avoid canned foods and premade or frozen meals. Cooking  Avoid adding salt when cooking. Use salt-free seasonings or herbs instead of table salt or sea salt. Check with your  health care provider or pharmacist before using salt substitutes.  Do not fry foods. Cook foods using healthy methods such as baking, boiling, grilling, and broiling instead.  Cook with heart-healthy oils, such as olive, canola, soybean, or sunflower oil. Meal planning  Eat a balanced diet that includes: ? 5 or more servings of fruits and vegetables each day. At each meal, try to fill half of your plate with fruits and vegetables. ? Up to 6-8 servings of whole grains each day. ? Less than 6 oz of lean meat, poultry, or fish each day. A 3-oz serving of meat is about the same size as a deck of cards. One egg equals 1 oz. ? 2 servings of low-fat dairy each day. ? A serving of nuts, seeds, or beans 5 times each week. ? Heart-healthy fats. Healthy fats called Omega-3 fatty acids are found in foods such as flaxseeds and coldwater fish, like sardines, salmon, and mackerel.  Limit how much you eat of the following: ? Canned or prepackaged foods. ? Food that is high in trans fat, such as fried foods. ? Food that is high in saturated fat, such as fatty meat. ? Sweets, desserts, sugary drinks, and other foods with added sugar. ? Full-fat dairy products.  Do not salt foods before eating.  Try to eat at least 2 vegetarian meals each week.  Eat more home-cooked food and less restaurant,  buffet, and fast food.  When eating at a restaurant, ask that your food be prepared with less salt or no salt, if possible. What foods are recommended? The items listed may not be a complete list. Talk with your dietitian about what dietary choices are best for you. Grains Whole-grain or whole-wheat bread. Whole-grain or whole-wheat pasta. Brown rice. [place]. Bulgur. Whole-grain and low-sodium cereals. Pita bread. Low-fat, low-sodium crackers. Whole-wheat flour tortillas. Vegetables Fresh or frozen vegetables (raw, steamed, roasted, or grilled). Low-sodium or reduced-sodium tomato and vegetable juice.  Low-sodium or reduced-sodium tomato sauce and tomato paste. Low-sodium or reduced-sodium canned vegetables. Fruits All fresh, dried, or frozen fruit. Canned fruit in natural juice (without added sugar). Meat and other protein foods Skinless chicken or [place]. Ground chicken or [place]. Pork with fat trimmed off. Fish and seafood. Egg whites. Dried beans, peas, or lentils. Unsalted nuts, nut butters, and seeds. Unsalted canned beans. Lean cuts of beef with fat trimmed off. Low-sodium, lean deli meat. Dairy Low-fat (1%) or fat-free (skim) milk. Fat-free, low-fat, or reduced-fat cheeses. Nonfat, low-sodium ricotta or cottage cheese. Low-fat or nonfat yogurt. Low-fat, low-sodium cheese. Fats and oils Soft margarine without trans fats. Vegetable oil. Low-fat, reduced-fat, or light mayonnaise and salad dressings (reduced-sodium). Canola, safflower, olive, soybean, and sunflower oils. Avocado. Seasoning and other foods Herbs. Spices. Seasoning mixes without salt. Unsalted popcorn and pretzels. Fat-free sweets. What foods are not recommended? The items listed may not be a complete list. Talk with your dietitian about what dietary choices are best for you. Grains Baked goods made with fat, such as croissants, muffins, or some breads. Dry pasta or rice meal packs. Vegetables Creamed or fried vegetables. Vegetables in a cheese sauce. Regular canned vegetables (not low-sodium or reduced-sodium). Regular canned tomato sauce and paste (not low-sodium or reduced-sodium). Regular tomato and vegetable juice (not low-sodium or reduced-sodium). Angie Fava. Olives. Fruits Canned fruit in a light or heavy syrup. Fried fruit. Fruit in cream or butter sauce. Meat and other protein foods Fatty cuts of meat. Ribs. Fried meat. Berniece Salines. Sausage. Bologna and other processed lunch meats. Salami. Fatback. Hotdogs. Bratwurst. Salted nuts and seeds. Canned beans with added salt. Canned or smoked fish. Whole eggs or egg yolks. Chicken  or [place] with skin. Dairy Whole or 2% milk, cream, and half-and-half. Whole or full-fat cream cheese. Whole-fat or sweetened yogurt. Full-fat cheese. Nondairy creamers. Whipped toppings. Processed cheese and cheese spreads. Fats and oils Butter. Stick margarine. Lard. Shortening. Ghee. Bacon fat. Tropical oils, such as coconut, palm kernel, or palm oil. Seasoning and other foods Salted popcorn and pretzels. Onion salt, garlic salt, seasoned salt, table salt, and sea salt. Worcestershire sauce. Tartar sauce. Barbecue sauce. Teriyaki sauce. Soy sauce, including reduced-sodium. Steak sauce. Canned and packaged gravies. Fish sauce. Oyster sauce. Cocktail sauce. Horseradish that you find on the shelf. Ketchup. Mustard. Meat flavorings and tenderizers. Bouillon cubes. Hot sauce and Tabasco sauce. Premade or packaged marinades. Premade or packaged taco seasonings. Relishes. Regular salad dressings. Where to find more information:  National Heart, Lung, and [place]: https://wilson-eaton.com/  American Heart Association: www.heart.org Summary  The DASH eating plan is a healthy eating plan that has been shown to reduce high blood pressure (hypertension). It may also reduce your risk for type 2 diabetes, heart disease, and stroke.  With the DASH eating plan, you should limit salt (sodium) intake to 2,300 mg a day. If you have hypertension, you may need to reduce your sodium intake to 1,500 mg a day.  When  on the DASH eating plan, aim to eat more fresh fruits and vegetables, whole grains, lean proteins, low-fat dairy, and heart-healthy fats.  Work with your health care provider or diet and nutrition specialist (dietitian) to adjust your eating plan to your individual calorie needs. This information is not intended to replace advice given to you by your health care provider. Make sure you discuss any questions you have with your health care provider. Document Revised: 08/30/2017 Document Reviewed:  09/10/2016 Elsevier Patient Education  2020 [employer].

## 2019-11-12 ENCOUNTER — Other Ambulatory Visit: Payer: Self-pay | Admitting: Family Medicine

## 2019-11-12 ENCOUNTER — Other Ambulatory Visit: Payer: Self-pay | Admitting: Internal Medicine

## 2019-11-12 DIAGNOSIS — E559 Vitamin D deficiency, unspecified: Secondary | ICD-10-CM

## 2019-11-12 LAB — COMPREHENSIVE METABOLIC PANEL
ALT: 13 IU/L (ref 0–32)
AST: 13 IU/L (ref 0–40)
Albumin/Globulin Ratio: 1.7 (ref 1.2–2.2)
Albumin: 4 g/dL (ref 3.8–4.8)
Alkaline Phosphatase: 117 IU/L (ref 39–117)
BUN/Creatinine Ratio: 11 (ref 9–23)
BUN: 10 mg/dL (ref 6–24)
Bilirubin Total: 0.3 mg/dL (ref 0.0–1.2)
CO2: 23 mmol/L (ref 20–29)
Calcium: 9.4 mg/dL (ref 8.7–10.2)
Chloride: 105 mmol/L (ref 96–106)
Creatinine, Ser: 0.9 mg/dL (ref 0.57–1.00)
GFR calc Af Amer: 89 mL/min/{1.73_m2} (ref >59)
GFR calc non Af Amer: 77 mL/min/{1.73_m2} (ref >59)
Globulin, Total: 2.4 g/dL (ref 1.5–4.5)
Glucose: 99 mg/dL (ref 65–99)
Potassium: 4.2 mmol/L (ref 3.5–5.2)
Sodium: 142 mmol/L (ref 134–144)
Total Protein: 6.4 g/dL (ref 6.0–8.5)

## 2019-11-12 LAB — CBC WITH DIFFERENTIAL/PLATELET
Basophils Absolute: 0 10*3/uL (ref 0.0–0.2)
Basos: 0 %
EOS (ABSOLUTE): 0.1 10*3/uL (ref 0.0–0.4)
Eos: 2 %
Hematocrit: 35.4 % (ref 34.0–46.6)
Hemoglobin: 12 g/dL (ref 11.1–15.9)
Immature Grans (Abs): 0 10*3/uL (ref 0.0–0.1)
Immature Granulocytes: 0 %
Lymphocytes Absolute: 1.8 10*3/uL (ref 0.7–3.1)
Lymphs: 26 %
MCH: 27.8 pg (ref 26.6–33.0)
MCHC: 33.9 g/dL (ref 31.5–35.7)
MCV: 82 fL (ref 79–97)
Monocytes Absolute: 0.3 10*3/uL (ref 0.1–0.9)
Monocytes: 5 %
Neutrophils Absolute: 4.5 10*3/uL (ref 1.4–7.0)
Neutrophils: 67 %
Platelets: 253 10*3/uL (ref 150–450)
RBC: 4.31 x10E6/uL (ref 3.77–5.28)
RDW: 15.1 % (ref 11.7–15.4)
WBC: 6.8 10*3/uL (ref 3.4–10.8)

## 2019-11-12 LAB — T4, FREE: Free T4: 1.09 ng/dL (ref 0.82–1.77)

## 2019-11-12 LAB — VITAMIN D 25 HYDROXY (VIT D DEFICIENCY, FRACTURES): Vit D, 25-Hydroxy: 17.7 ng/mL — ABNORMAL LOW (ref 30.0–100.0)

## 2019-11-12 LAB — TSH: TSH: <0.005 u[IU]/mL — ABNORMAL LOW (ref 0.450–4.500)

## 2019-11-12 LAB — T3: T3, Total: 115 ng/dL (ref 71–180)

## 2019-11-12 MED ORDER — VITAMIN D (ERGOCALCIFEROL) 1.25 MG (50000 UNIT) PO CAPS: 50000.0000 [IU] | ORAL_CAPSULE | ORAL | 0 refills | Status: DC

## 2019-11-12 NOTE — Progress Notes (Signed)
Our mutual patient requested that I check her thyroid function while having other labs here in my office yesterday. I am forwarding you her results since you are managing her thyroid disorder. Thank you for taking care of her.

## 2019-11-28 ENCOUNTER — Ambulatory Visit: Payer: BC Managed Care – PPO | Attending: Internal Medicine

## 2019-11-28 DIAGNOSIS — Z23 Encounter for immunization: Secondary | ICD-10-CM

## 2019-11-28 NOTE — Progress Notes (Signed)
   Covid-19 Vaccination Clinic  Name:  Jonasia Carlozzi    MRN: [id number] DOB: 05/31/1971  11/28/2019  Ms. Skilling was observed post Covid-19 immunization for 15 minutes without incidence. She was provided with Vaccine Information Sheet and instruction to access the V-Safe system.   Ms. Hogenson was instructed to call 911 with any severe reactions post vaccine: Marland Kitchen Difficulty breathing  . Swelling of your face and throat  . A fast heartbeat  . A bad rash all over your body  . Dizziness and weakness    Immunizations Administered    Name Date Dose VIS Date Route   Pfizer COVID-19 Vaccine 11/28/2019  5:07 PM 0.3 mL 09/11/2019 Intramuscular   Manufacturer: [place]   Lot: [id number]   [place]: [id number]

## 2019-12-08 NOTE — Progress Notes (Signed)
Subjective:    Patient ID: Kim Tanner, female    DOB: Nov 16, 1970, 49 y.o.   MRN: [id number]  HPI Chief Complaint  Patient presents with  . follow-up    follow-up on bp, 140-150/80-90.    Here to follow up on uncontrolled HTN and vitamin D def  Blood pressure has been elevated at home and again here in the office today. Reports taking HCTZ 25 mg daily. States she is aware that she may need additional medication and does not want to take amlodipine due to history of LE edema   Diet is fairly low in sodium.  She saw Dr. Leafy Ro in 11/2018 and was told she may have sleep apnea. Epworth was 14. Snores and witnessed apneic spells. This was never pursued and she is no longer seeing [facility].  States she snores, does not sleep well and has been told she stops breathing at night.   She had an EKG and echo done in 11/2018. Ordered by Dr. Leafy Ro due to dyspnea and new onset heart murmur.  EF 60-65%  Denies chest pain, palpitations, LE edema.  She does report orthopnea, PND.  Sleeps on 4 pillows.   Dr. Cruzita Lederer is treating her for hyperthyroidism.   Asthma well controlled, no recent flares.   Vitamin D def- prescribed 50,000 IUs of vitamin D weekly and reports taking this.   Hx of uterine ablation and irregular periods.  She is sexually active.   Works for [employer] as [employer].  Getting Pfizer Covid-19 vaccine in one week.   Reviewed allergies, medications, past medical, surgical, family, and social history.    Review of Systems Pertinent positives and negatives in the history of present illness.     Objective:   Physical Exam BP (!) 150/88   Pulse 98   Wt 260 lb (117.9 kg)   BMI 49.13 kg/m   Alert and in no distress.  Cardiac exam shows a regular rhythm without murmurs or gallops. Lungs are clear to auscultation. Extremities without edema.        Assessment & Plan:  Uncontrolled hypertension - Plan: Ambulatory referral to Cardiology,  losartan-hydrochlorothiazide (HYZAAR) 50-12.5 MG tablet -We have been trying to get her blood pressure under good control for several months.  She reports good daily compliance with HCTZ 25 mg however, her blood pressure is still elevated.  I will start her on losartan/HCTZ today.  Once again I counseled her on a healthy diet including low sodium and increasing physical activity once cleared by cardiology.  We also discussed that if she has untreated sleep apnea that this could also be complicating her hypertension. HST ordered.   Morbid obesity ([place]) -Counseling on healthy diet and exercise.  She was seeing [facility] but is no longer a patient there.  Vitamin D deficiency -She is currently taking 50,000 IUs weekly.  We will need to follow-up on her vitamin D level.  Declines labs today.  DOE (dyspnea on exertion) - Plan: Ambulatory referral to Cardiology -She was being worked up for this in early 2020 by Dr. Leafy Ro.  She had an echo cardiogram which showed normal EF.  Dr. Leafy Ro reported a new onset murmur last year at the time in which she ordered the echo however, I do not appreciate a murmur today.  Snoring - Plan: Home sleep test -I suspect she may have sleep apnea and I will order a home sleep study to check for this.  At risk for sleep apnea -  Plan: Home sleep test -Several risk factors suggesting OSA.  Discussed potential long-term health consequences associated with untreated OSA.  She is in agreement to have this done.  Orthopnea - Plan: Home sleep test, Ambulatory referral to Cardiology -Refer to cardiology and order a home sleep test.  Graves disease -This is managed by Dr. Cruzita Lederer

## 2019-12-09 ENCOUNTER — Encounter: Payer: Self-pay | Admitting: Family Medicine

## 2019-12-09 ENCOUNTER — Ambulatory Visit (INDEPENDENT_AMBULATORY_CARE_PROVIDER_SITE_OTHER): Payer: BC Managed Care – PPO | Admitting: Family Medicine

## 2019-12-09 ENCOUNTER — Other Ambulatory Visit: Payer: Self-pay

## 2019-12-09 VITALS — BP 150/88 | HR 98 | Wt 260.0 lb

## 2019-12-09 DIAGNOSIS — R0600 Dyspnea, unspecified: Secondary | ICD-10-CM

## 2019-12-09 DIAGNOSIS — R0609 Other forms of dyspnea: Secondary | ICD-10-CM | POA: Insufficient documentation

## 2019-12-09 DIAGNOSIS — I10 Essential (primary) hypertension: Secondary | ICD-10-CM | POA: Diagnosis not present

## 2019-12-09 DIAGNOSIS — E559 Vitamin D deficiency, unspecified: Secondary | ICD-10-CM

## 2019-12-09 DIAGNOSIS — Z9189 Other specified personal risk factors, not elsewhere classified: Secondary | ICD-10-CM | POA: Insufficient documentation

## 2019-12-09 DIAGNOSIS — R0683 Snoring: Secondary | ICD-10-CM | POA: Insufficient documentation

## 2019-12-09 DIAGNOSIS — R0601 Orthopnea: Secondary | ICD-10-CM | POA: Insufficient documentation

## 2019-12-09 DIAGNOSIS — E0500 Thyrotoxicosis with diffuse goiter without thyrotoxic crisis or storm: Secondary | ICD-10-CM

## 2019-12-09 MED ORDER — LOSARTAN POTASSIUM-HCTZ 50-12.5 MG PO TABS: 1.0000 | ORAL_TABLET | Freq: Every day | ORAL | 2 refills | Status: DC

## 2019-12-09 NOTE — Patient Instructions (Signed)
I am starting you on a new medication.  A combination blood pressure medication called losartan/HCTZ. Stop taking the HCTZ only pill.  Make sure you are eating a low-sodium diet.  You will get a call from [facility] regarding the home sleep study.  You will get a call from [place] to schedule a visit with a cardiologist.

## 2019-12-15 ENCOUNTER — Ambulatory Visit: Payer: BC Managed Care – PPO | Admitting: Cardiology

## 2019-12-19 ENCOUNTER — Ambulatory Visit: Payer: BC Managed Care – PPO | Attending: Internal Medicine

## 2019-12-19 DIAGNOSIS — Z23 Encounter for immunization: Secondary | ICD-10-CM

## 2019-12-19 NOTE — Progress Notes (Signed)
   Covid-19 Vaccination Clinic  Name:  [place]    MRN: [id number] DOB: 06/20/1971  12/19/2019  [name] was observed post Covid-19 immunization for 15 minutes without incident. She was provided with Vaccine Information Sheet and instruction to access the V-Safe system.   [name] was instructed to call 911 with any severe reactions post vaccine: Marland Kitchen Difficulty breathing  . Swelling of face and throat  . A fast heartbeat  . A bad rash all over body  . Dizziness and weakness   Immunizations Administered    Name Date Dose VIS Date Route   Pfizer COVID-19 Vaccine 12/19/2019  9:12 AM 0.3 mL 09/11/2019 Intramuscular   Manufacturer: [place]   Lot: [id number]   [place]: [id number]

## 2020-01-05 ENCOUNTER — Ambulatory Visit: Payer: BC Managed Care – PPO | Admitting: Cardiovascular Disease

## 2020-01-19 ENCOUNTER — Other Ambulatory Visit: Payer: Self-pay

## 2020-01-19 ENCOUNTER — Ambulatory Visit: Payer: BC Managed Care – PPO | Admitting: Internal Medicine

## 2020-01-19 DIAGNOSIS — Z0289 Encounter for other administrative examinations: Secondary | ICD-10-CM

## 2020-01-20 ENCOUNTER — Telehealth: Payer: Self-pay | Admitting: Family Medicine

## 2020-01-20 NOTE — Telephone Encounter (Signed)
Please call and get more information. Where is the rash, how long has it been there and what does it look like? What cream has she been using? Thanks.

## 2020-01-20 NOTE — Telephone Encounter (Signed)
Let's do a virtual or in office to see which treatment we should switch to.

## 2020-01-20 NOTE — Telephone Encounter (Signed)
Pt called and said at her last visit she was told to call if the rash she had hadn't cleared up while using the over the counter cream. She said the rash has not cleared up

## 2020-01-20 NOTE — Telephone Encounter (Signed)
Pt states the rash is on her right breast. Been there a couple months and thought it was dry skin, she says you told her to use hydrocostione cream daily. She has been using it twice a day. She says the rash is still about the same.

## 2020-01-21 ENCOUNTER — Encounter: Payer: Self-pay | Admitting: Internal Medicine

## 2020-01-21 ENCOUNTER — Other Ambulatory Visit: Payer: Self-pay

## 2020-01-21 ENCOUNTER — Encounter: Payer: Self-pay | Admitting: Family Medicine

## 2020-01-21 ENCOUNTER — Ambulatory Visit (INDEPENDENT_AMBULATORY_CARE_PROVIDER_SITE_OTHER): Payer: BC Managed Care – PPO | Admitting: Internal Medicine

## 2020-01-21 ENCOUNTER — Telehealth: Payer: BC Managed Care – PPO | Admitting: Family Medicine

## 2020-01-21 VITALS — BP 128/80 | HR 62 | Ht 61.0 in | Wt 258.0 lb

## 2020-01-21 VITALS — Wt 250.0 lb

## 2020-01-21 DIAGNOSIS — E0500 Thyrotoxicosis with diffuse goiter without thyrotoxic crisis or storm: Secondary | ICD-10-CM

## 2020-01-21 DIAGNOSIS — E890 Postprocedural hypothyroidism: Secondary | ICD-10-CM | POA: Diagnosis not present

## 2020-01-21 DIAGNOSIS — R21 Rash and other nonspecific skin eruption: Secondary | ICD-10-CM | POA: Diagnosis not present

## 2020-01-21 DIAGNOSIS — E041 Nontoxic single thyroid nodule: Secondary | ICD-10-CM

## 2020-01-21 NOTE — Progress Notes (Signed)
   Subjective:  Documentation for virtual audio and video telecommunications through [place] encounter:  The patient was located at home. 2 patient identifiers used.  The provider was located in the office. The patient did consent to this visit and is aware of possible charges through their insurance for this visit.  The other persons participating in this telemedicine service were none. Time spent on call was 11 minutes and in review of previous records 15 minutes total.  This virtual service is not related to other E/M service within previous 7 days.   Patient ID: Kim Tanner, female    DOB: Feb 21, 1971, 49 y.o.   MRN: [id number]  HPI Chief Complaint  Patient presents with  . right breast    rash on right breast on top. been there a couple months- using over the counter cream   Complains a 6 month history of a darkened area of skin on the top of her right breast. States it occasionally is red and pruritic after a hot shower, otherwise no itching. States the area has not worsened. No nipple discharge or involvement. Denies feeling a mass or firmness. States the skin is not dimpled like an orange peel.  States she feels well.   States she has tried a steroid cream for the past 3 weeks and no improvement and no worsening.   No fever, chills, breast pain, N/V/D.   Denies a rash or skin color change to any other part of her body.    Review of Systems .Pertinent positives and negatives in the history of present illness.     Objective:   Physical Exam Wt 250 lb (113.4 kg)   BMI 47.24 kg/m   Alert and oriented and in no acute distress.  Picture via MyChart shows an area of hyperpigmentation on the top of her right breast which does not involve the areola.  No peau d'orange appearance.      Assessment & Plan:  Rash and nonspecific skin eruption  Discussed limitations of a virtual visit.  She did send a picture via MyChart.  No red flag symptoms.  No sign of an  infectious process. She has tried a steroid cream and has not noticed any improvement.  Encouraged her to try over-the-counter Lamisil and follow-up if she notices any new or worsening symptoms.

## 2020-01-21 NOTE — Progress Notes (Signed)
Patient ID: Kim Tanner, female   DOB: 1971/05/24, 49 y.o.   MRN: [id number]   This visit occurred during the SARS-CoV-2 public health emergency.  Safety protocols were in place, including screening questions prior to the visit, additional usage of staff PPE, and extensive cleaning of exam room while observing appropriate contact time as indicated for disinfecting solutions.   HPI  Kim Tanner is a 49 y.o.-year-old female, initially referred by her PCP,Henson, Vickie L, NP-C, returning for follow-up for thyrotoxicosis, diagnosed as Graves' disease after last visit.  Last visit 3 months ago.  Reviewed history: Patient was diagnosed with thyrotoxicosis in 11/2016.  She was referred to endocrinology then, but could not come at that time, and only got to see me for the first time in 11/2018.  At last visit, her TFTs were still thyrotoxic and her TSI antibodies were elevated, confirming Graves' disease.  We started methimazole 5 mg twice a day, but she did not come back for labs 1.5 months later...  She had another set of TFTs by PCP in 05/2019 and these appeared worse.    She tells me that before her labs in 05/2019, she was missing 2nd dose of MMI at the day.  We contacted her to start taking the medication consistently.  At last visit she was taking 5 mg twice a day.  Since then, we checked a thyroid uptake and scan (07/28/2019) and this was positive for Graves' disease: 4 hour I-123 uptake = 39%% (normal 5-20%) 24 hour I-123 uptake = 60%% (normal 10-30%)  IMPRESSION: Elevated 4 hour and 24 hour radio iodine uptakes consistent with hyperthyroidism. No focal scintigraphic abnormalities on imaging. Findings consistent with Graves disease.  She had RAI treatment (08/31/2019).  She is feeling well after her RAI treatment, with no complaints.  Reviewed her TFTs: Lab Results  Component Value Date   TSH <0.005 (L) 11/11/2019   TSH <0.01 (L) 07/14/2019   TSH <0.005 (L) 05/11/2019    TSH <0.01 (L) 12/19/2018   TSH <0.006 (L) 11/18/2018   TSH <0.006 (L) 09/16/2018   TSH <0.006 (L) 02/25/2018   TSH 0.01 (L) 08/21/2017   TSH 0.01 (L) 01/10/2017   TSH 0.01 (L) 12/26/2016   FREET4 1.09 11/11/2019   FREET4 1.34 07/14/2019   FREET4 2.37 (H) 05/11/2019   FREET4 1.53 12/19/2018   FREET4 2.14 (H) 11/18/2018   FREET4 1.69 09/16/2018   FREET4 1.63 02/25/2018   FREET4 1.4 08/21/2017   FREET4 1.3 12/26/2016   T3FREE 3.8 07/14/2019   T3FREE 5.3 (H) 12/19/2018   T3FREE 3.8 12/26/2016   Her TSI's were elevated, confirming Graves' disease: Lab Results  Component Value Date   TSI 386 (H) 12/19/2018    Thyroid U/S (01/21/2017): Moderately heterogeneous thyroid of normal size, with 1.0 cm nodule  She had pressure in neck after RAI treatment, now resolved.  However, she denies: - hoarseness - dysphagia - choking - SOB with lying down  No FH of thyroid disease or thyroid cancer.No FH of thyroid cancer. No h/o radiation tx to head or neck except for RAI treatment.  No seaweed or kelp. No recent contrast studies. No herbal supplements. No Biotin use. No recent steroids use.   She also has anxiety - stable.  She saw Dr. Adair Patter in the weight management clinic. She is seeing at the [facility]. She tried Phentermine >> felt poorly >> stopped.  She will try another medication but she cannot remember the name.  She has a history of prediabetes  and was started on Metformin by Dr. Leafy Ro 11/2018-now off.  Latest HbA1c levels were reviewed: Lab Results  Component Value Date   HGBA1C 5.6 05/11/2019   HGBA1C 5.5 11/18/2018   HGBA1C 5.8 (H) 09/16/2018   She has a history of HTN-on HCTZ.  ROS: Constitutional: no weight gain/no weight loss, no fatigue, no subjective hyperthermia, no subjective hypothermia Eyes: no blurry vision, no xerophthalmia ENT: no sore throat, + see HPI Cardiovascular: no CP/no SOB/no palpitations/no leg swelling Respiratory: no cough/no SOB/no  wheezing Gastrointestinal: no N/no V/no D/no C/no acid reflux Musculoskeletal: no muscle aches/no joint aches Skin: no rashes, no hair loss Neurological: no tremors/no numbness/no tingling/no dizziness  I reviewed pt's medications, allergies, PMH, social hx, family hx, and changes were documented in the history of present illness. Otherwise, unchanged from my initial visit note.  Past Medical History:  Diagnosis Date  . Anxiety   . Asthma   . Back pain   . Dyspnea   . HTN (hypertension)   . Joint pain   . Lower extremity edema   . Metabolic syndrome   . Morbid obesity ([place])   . Prediabetes    Past Surgical History:  Procedure Laterality Date  . BREAST REDUCTION SURGERY  2008  . CESAREAN SECTION    . DILATION AND CURETTAGE OF UTERUS    . INDUCED ABORTION    . REDUCTION MAMMAPLASTY    . uterine ablation     Social History   Socioeconomic History  . Marital status: Single    Spouse name: Not on file  . Number of children: 2  . Years of education: Not on file  . Highest education level: Not on file  Occupational History  . 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    Employer: [employer]  Tobacco Use  . Smoking status: Never Smoker  . Smokeless tobacco: Never Used  Substance and Sexual Activity  . Alcohol use: Not Currently    Comment: socially   . Drug use: No  . Sexual activity: Not Currently    Birth control/protection: Condom  Other Topics Concern  . Not on file  Social History Narrative  . Not on file   Social Determinants of Health   Financial Resource Strain:   . Difficulty of Paying Living Expenses:   Food Insecurity:   . Worried About Charity fundraiser in the Last Year:   . Arboriculturist in the Last Year:   Transportation Needs:   . Film/video editor (Medical):   Marland Kitchen Lack of Transportation (Non-Medical):   Physical Activity:   . Days of Exercise per Week:   . Minutes of Exercise per Session:   Stress:   . Feeling of Stress :    Social Connections:   . Frequency of Communication with Friends and Family:   . Frequency of Social Gatherings with Friends and Family:   . Attends Religious Services:   . Active Member of Clubs or Organizations:   . Attends Archivist Meetings:   Marland Kitchen Marital Status:   Intimate Partner Violence:   . Fear of Current or Ex-Partner:   . Emotionally Abused:   Marland Kitchen Physically Abused:   . Sexually Abused:    Current Outpatient Medications on File Prior to Visit  Medication Sig Dispense Refill  . albuterol (PROVENTIL HFA;VENTOLIN HFA) 108 (90 Base) MCG/ACT inhaler Inhale 2 puffs into the lungs every 6 (six) hours as needed for wheezing or shortness of breath. 1 Inhaler 1  .  Fluticasone-Salmeterol (ADVAIR DISKUS) 100-50 MCG/DOSE AEPB Inhale 1 puff into the lungs 2 (two) times daily. 60 each 1  . losartan-hydrochlorothiazide (HYZAAR) 50-12.5 MG tablet Take 1 tablet by mouth daily. 30 tablet 2  . meclizine (ANTIVERT) 25 MG tablet TAKE 1 TABLET(25 MG) BY MOUTH TWICE DAILY AS NEEDED FOR DIZZINESS 30 tablet 0  . Vitamin D, Ergocalciferol, (DRISDOL) 1.25 MG (50000 UNIT) CAPS capsule Take 1 capsule (50,000 Units total) by mouth every 7 (seven) days. 12 capsule 0   No current facility-administered medications on file prior to visit.   Allergies  Allergen Reactions  . Compazine [Prochlorperazine Edisylate]    Family History  Problem Relation Age of Onset  . Hypertension Mother   . Obesity Mother   . Hypertension Father   . Diabetes Father   . Diabetes Sister   . Stroke Sister 73  . Breast cancer Neg Hx    PE: BP 128/80   Pulse 62   Ht 5\' 1"  (1.549 m)   Wt 258 lb (117 kg)   SpO2 99%   BMI 48.75 kg/m  Wt Readings from Last 3 Encounters:  01/21/20 258 lb (117 kg)  01/21/20 250 lb (113.4 kg)  12/09/19 260 lb (117.9 kg)   Constitutional: overweight, in NAD Eyes: PERRLA, EOMI, no exophthalmos, no lid lag, no stare ENT: moist mucous membranes, + mild symmetric thyromegaly, no  cervical lymphadenopathy Cardiovascular: RRR, No MRG Respiratory: CTA B Gastrointestinal: abdomen soft, NT, ND, BS+ Musculoskeletal: no deformities, strength intact in all 4 Skin: moist, warm, no rashes, + acanthosis nigricans on neck, skin tags on neck Neurological: no tremor with outstretched hands, DTR normal in all 4  ASSESSMENT: 1.  Graves' disease -Now status post RAI treatment  2.  Right thyroid nodule  PLAN:  1. Patient with a long history of untreated thyrotoxicosis since at least 2018, without thyrotoxic symptoms.  She denied weight loss, heat intolerance, hyper defecation, palpitations, anxiety.  However, she did have decreased energy and hair loss.  We checked her Graves' antibodies in the past and they were elevated, pointing towards Graves' disease.  We tried methimazole but she was not very compliant with the medication and labs and her TFTs remain abnormal.  I again suggested RAI treatment.  We first obtained a thyroid uptake and scan in 07/2019 and this confirms Graves' disease and no cold nodules.  She had RAI treatment on 08/31/2019. -Her TFTs were checked after her RAI treatment: Her TSH is still suppressed but I explained that this is the last to improve, while her free thyroid hormones improved and they were normal, in 11/2019 -Also, at this visit, patient is feeling well, without hypo or hyperthyroid complaints.  She continues to have anxiety, which is chronic. -We will recheck her TFTs today -We discussed that we may need to start levothyroxine.  I explained how to take this correctly in case we need to start. -We also discussed that after every change in levothyroxine dose, we need to check labs approximately 1.5 months afterwards -Of note, no signs of Graves' ophthalmopathy: No double vision, blurry vision, eye pain, chemosis -I will see her back in 4 months or possibly sooner for labs  2.  Right thyroid nodule -Measuring 1 cm, seen on thyroid ultrasound from  2018 -We discussed that this is a small nodule without worrisome features -As of now, she has no neck compression symptoms -We will continue to follow her clinically  Component     Latest Ref Rng & Units 01/21/2020  TSH     0.35 - 4.50 uIU/mL 90.54 Repeated and verified X2. (H)  T4,Free(Direct)     0.60 - 1.60 ng/dL 0.19 (L)  Triiodothyronine,Free,Serum     2.3 - 4.2 pg/mL 2.0 (L)   Significant hypothyroidism developed after her RAI treatment.  We will need to start levothyroxine 50 mcg daily and have her back for recheck in 5 weeks.  At that time, will probably need to increase the dose.  Philemon Kingdom, MD PhD [facility] Endocrinology

## 2020-01-21 NOTE — Patient Instructions (Signed)
Please stop at the lab.  Please come back for a follow-up appointment in 6 months.  

## 2020-01-21 NOTE — Telephone Encounter (Signed)
Virtual today

## 2020-01-22 LAB — TSH: TSH: 90.54 u[IU]/mL — ABNORMAL HIGH (ref 0.35–4.50)

## 2020-01-22 LAB — T3, FREE: T3, Free: 2 pg/mL — ABNORMAL LOW (ref 2.3–4.2)

## 2020-01-22 LAB — T4, FREE: Free T4: 0.19 ng/dL — ABNORMAL LOW (ref 0.60–1.60)

## 2020-01-22 MED ORDER — LEVOTHYROXINE SODIUM 50 MCG PO TABS: 50.0000 ug | ORAL_TABLET | Freq: Every day | ORAL | 5 refills | Status: DC

## 2020-01-27 ENCOUNTER — Encounter: Payer: Self-pay | Admitting: Cardiovascular Disease

## 2020-01-27 ENCOUNTER — Ambulatory Visit (INDEPENDENT_AMBULATORY_CARE_PROVIDER_SITE_OTHER): Payer: BC Managed Care – PPO | Admitting: Cardiovascular Disease

## 2020-01-27 ENCOUNTER — Other Ambulatory Visit: Payer: Self-pay

## 2020-01-27 DIAGNOSIS — R0600 Dyspnea, unspecified: Secondary | ICD-10-CM | POA: Diagnosis not present

## 2020-01-27 DIAGNOSIS — R0609 Other forms of dyspnea: Secondary | ICD-10-CM

## 2020-01-27 NOTE — Progress Notes (Signed)
01/27/2020 Skylene Ottmar   1970/10/25  [id number]  Primary Physician Girtha Rm, NP-C Primary Cardiologist: Lorretta Harp MD Renae Gloss  HPI:  Kim Tanner is a 49 y.o. 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  She is she was referred by Mack Hook, NP for evaluation of dyspnea.  She does have reactive airways disease on her bronchodilators.  Her only other risk factor includes hypertension.  There is no family history of heart disease.  She is never had a heart tach or stroke.  She denies chest pain but does get occasionally short of breath.  She exercises 4 days a week with her daughter.  She is also scheduled to have an in-home sleep study this coming June for symptoms compatible with obstructive sleep apnea.   Current Meds  Medication Sig  . albuterol (PROVENTIL HFA;VENTOLIN HFA) 108 (90 Base) MCG/ACT inhaler Inhale 2 puffs into the lungs every 6 (six) hours as needed for wheezing or shortness of breath.  . Fluticasone-Salmeterol (ADVAIR DISKUS) 100-50 MCG/DOSE AEPB Inhale 1 puff into the lungs 2 (two) times daily.  Marland Kitchen levothyroxine (SYNTHROID) 50 MCG tablet Take 1 tablet (50 mcg total) by mouth daily.  Marland Kitchen losartan-hydrochlorothiazide (HYZAAR) 50-12.5 MG tablet Take 1 tablet by mouth daily.  . meclizine (ANTIVERT) 25 MG tablet TAKE 1 TABLET(25 MG) BY MOUTH TWICE DAILY AS NEEDED FOR DIZZINESS  . Vitamin D, Ergocalciferol, (DRISDOL) 1.25 MG (50000 UNIT) CAPS capsule Take 1 capsule (50,000 Units total) by mouth every 7 (seven) days.     Allergies  Allergen Reactions  . Compazine [Prochlorperazine Edisylate]     Social History   Socioeconomic History  . Marital status: Single    Spouse name: Not on file  . Number of children: 2  . Years of education: Not on file  . Highest education level: Not on file  Occupational History  . 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 Smoking status: Never Smoker  . Smokeless tobacco: Never Used  Substance and Sexual Activity  . Alcohol use: Not Currently    Comment: socially   . Drug use: No  . Sexual activity: Not Currently    Birth control/protection: Condom  Other Topics Concern  . Not on file  Social History Narrative  . Not on file   Social Determinants of Health   Financial Resource Strain:   . Difficulty of Paying Living Expenses:   Food Insecurity:   . Worried About Charity fundraiser in the Last Year:   . Arboriculturist in the Last Year:   Transportation Needs:   . Film/video editor (Medical):   Marland Kitchen Lack of Transportation (Non-Medical):   Physical Activity:   . Days of Exercise per Week:   . Minutes of Exercise per Session:   Stress:   . Feeling of Stress :   Social Connections:   . Frequency of Communication with Friends and Family:   . Frequency of Social Gatherings with Friends and Family:   . Attends Religious Services:   . Active Member of Clubs or Organizations:   . Attends Archivist Meetings:   Marland Kitchen Marital Status:   Intimate Partner Violence:   . Fear of Current or Ex-Partner:   . Emotionally Abused:   Marland Kitchen Physically Abused:   . Sexually Abused:      Review  of Systems: General: negative for chills, fever, night sweats or weight changes.  Cardiovascular: negative for chest pain, dyspnea on exertion, edema, orthopnea, palpitations, paroxysmal nocturnal dyspnea or shortness of breath Dermatological: negative for rash Respiratory: negative for cough or wheezing Urologic: negative for hematuria Abdominal: negative for nausea, vomiting, diarrhea, bright red blood per rectum, melena, or hematemesis Neurologic: negative for visual changes, syncope, or dizziness All other systems reviewed and are otherwise negative except as noted above.    Blood pressure (!) 150/96, pulse 63, height 5\' 1"  (1.549 m), weight  262 lb 6.4 oz (119 kg).  General appearance: alert and no distress Neck: no adenopathy, no carotid bruit, no JVD, supple, symmetrical, trachea midline and thyroid not enlarged, symmetric, no tenderness/mass/nodules Lungs: clear to auscultation bilaterally Heart: regular rate and rhythm, S1, S2 normal, no murmur, click, rub or gallop Extremities: extremities normal, atraumatic, no cyanosis or edema Pulses: 2+ and symmetric Skin: Skin color, texture, turgor normal. No rashes or lesions Neurologic: Alert and oriented X 3, normal strength and tone. Normal symmetric reflexes. Normal coordination and gait  EKG sinus rhythm at 63 with nonspecific ST and T wave changes.  I personally reviewed this EKG.  ASSESSMENT AND PLAN:   DOE (dyspnea on exertion) Ms. Barrios was referred to me by Mack Hook, NP for dyspnea on exertion.  She is on inhaled bronchodilators for reactive airways disease.  She has a body habitus and symptoms including nocturnal snoring and daytime somnolence compatible with obstructive sleep apnea and scheduled for a in-home sleep study in June.  I suspect this will be positive and she will need CPAP.  I think this may also be contributing to her shortness of breath.      Lorretta Harp MD FACP,FACC,FAHA, [facility] 01/27/2020 4:46 PM

## 2020-01-27 NOTE — Patient Instructions (Signed)
Medication Instructions:  The current medical regimen is effective;  continue present plan and medications.  *If you need a refill on your cardiac medications before your next appointment, please call your pharmacy*   Follow-Up: At [facility], you and your health needs are our priority.  As part of our continuing mission to provide you with exceptional heart care, we have created designated Provider Care Teams.  These Care Teams include your primary Cardiologist (physician) and Advanced Practice Providers (APPs -  Physician Assistants and Nurse Practitioners) who all work together to provide you with the care you need, when you need it.  We recommend signing up for the patient portal called "MyChart".  Sign up information is provided on this After Visit Summary.  MyChart is used to connect with patients for Virtual Visits (Telemedicine).  Patients are able to view lab/test results, encounter notes, upcoming appointments, etc.  Non-urgent messages can be sent to your provider as well.   To learn more about what you can do with MyChart, go to [email or web address].    Your next appointment:   6 month(s)  The format for your next appointment:   In Person  Provider:   Quay Burow, MD   Other Instructions If sleep test comes back positive- we would like you to see Dr.Kelly- sleep medicine doctor in our office.

## 2020-01-27 NOTE — Assessment & Plan Note (Signed)
Kim Tanner was referred to me by Mack Hook, NP for dyspnea on exertion.  She is on inhaled bronchodilators for reactive airways disease.  She has a body habitus and symptoms including nocturnal snoring and daytime somnolence compatible with obstructive sleep apnea and scheduled for a in-home sleep study in June.  I suspect this will be positive and she will need CPAP.  I think this may also be contributing to her shortness of breath.

## 2020-02-06 ENCOUNTER — Other Ambulatory Visit: Payer: Self-pay | Admitting: Family Medicine

## 2020-02-06 DIAGNOSIS — E559 Vitamin D deficiency, unspecified: Secondary | ICD-10-CM

## 2020-02-08 ENCOUNTER — Telehealth: Payer: Self-pay

## 2020-02-08 NOTE — Telephone Encounter (Signed)
Needs to have vitamin D level checked to determine her dose of vitamin D.

## 2020-02-08 NOTE — Telephone Encounter (Signed)
I received a fax from [facility] for a refill request on the pts. Vit D 50,000 pt. Last apt was 01/21/20.

## 2020-02-08 NOTE — Telephone Encounter (Signed)
I called the pt. LM stating that she needs to get scheduled for a lab visit to check her Vit D level.

## 2020-02-19 IMAGING — MG MM DIGITAL SCREENING BILAT W/ CAD
4 series · 4 of 4 positions shown · non-contrast
Comparison: None.

CLINICAL DATA: Screening.

EXAM:
DIGITAL SCREENING BILATERAL MAMMOGRAM WITH CAD

[L MLO]
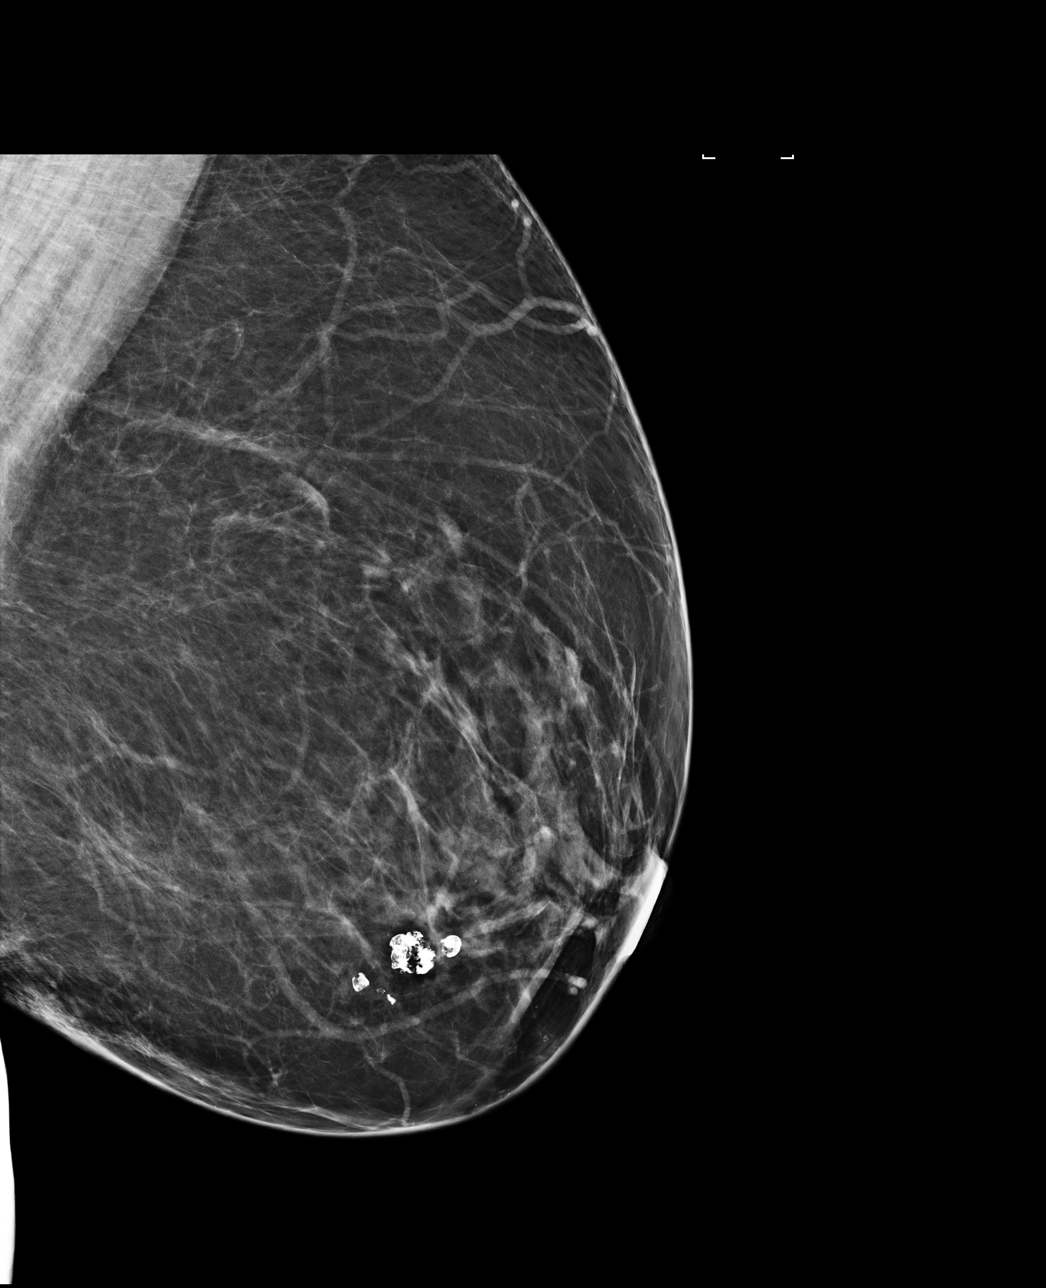

[R MLO]
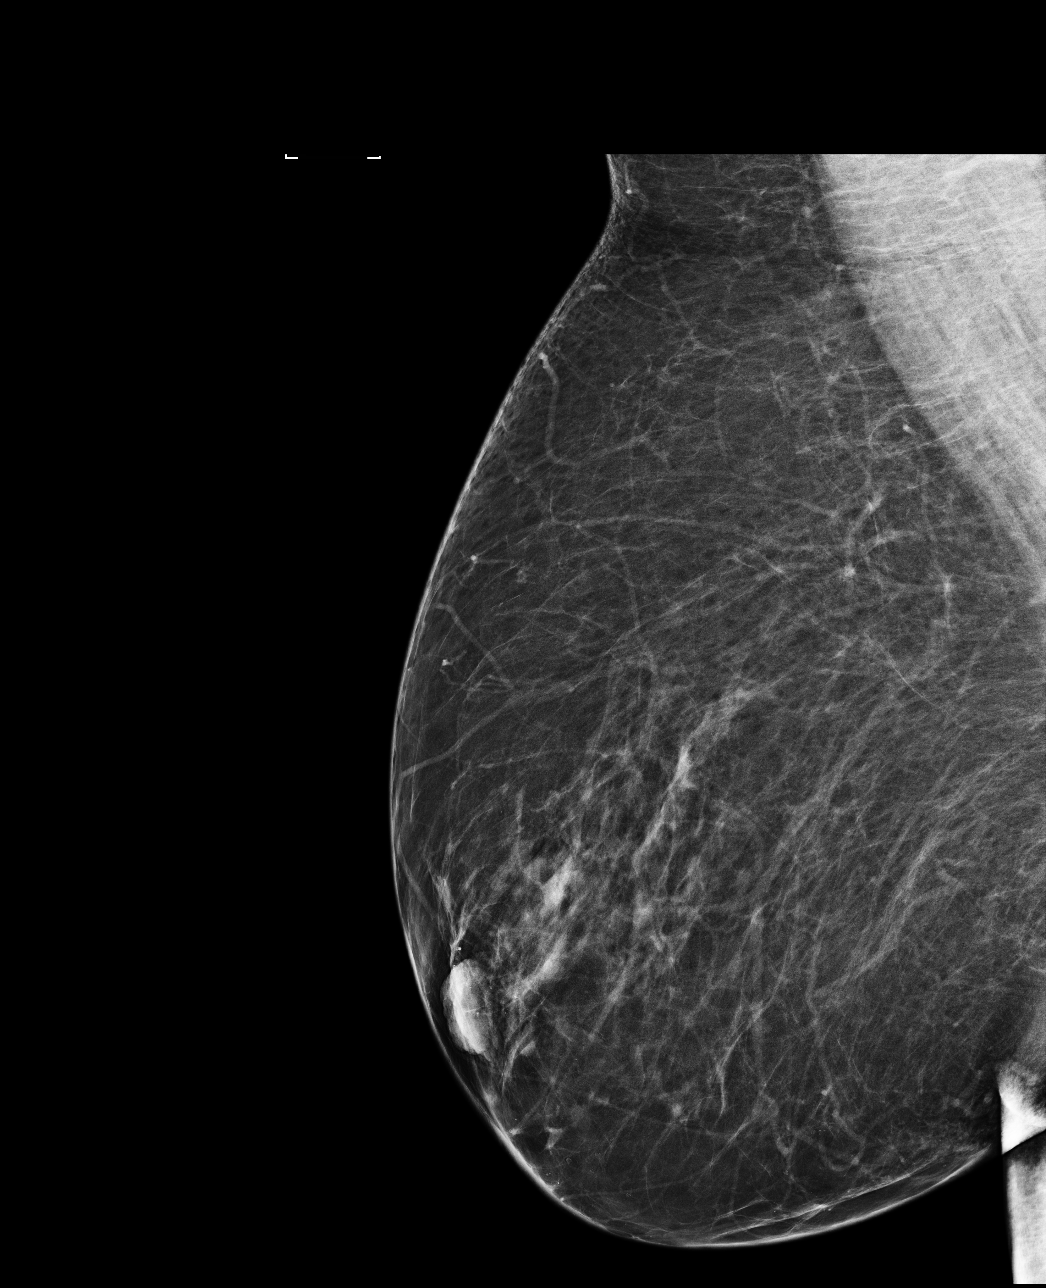

[L CC]
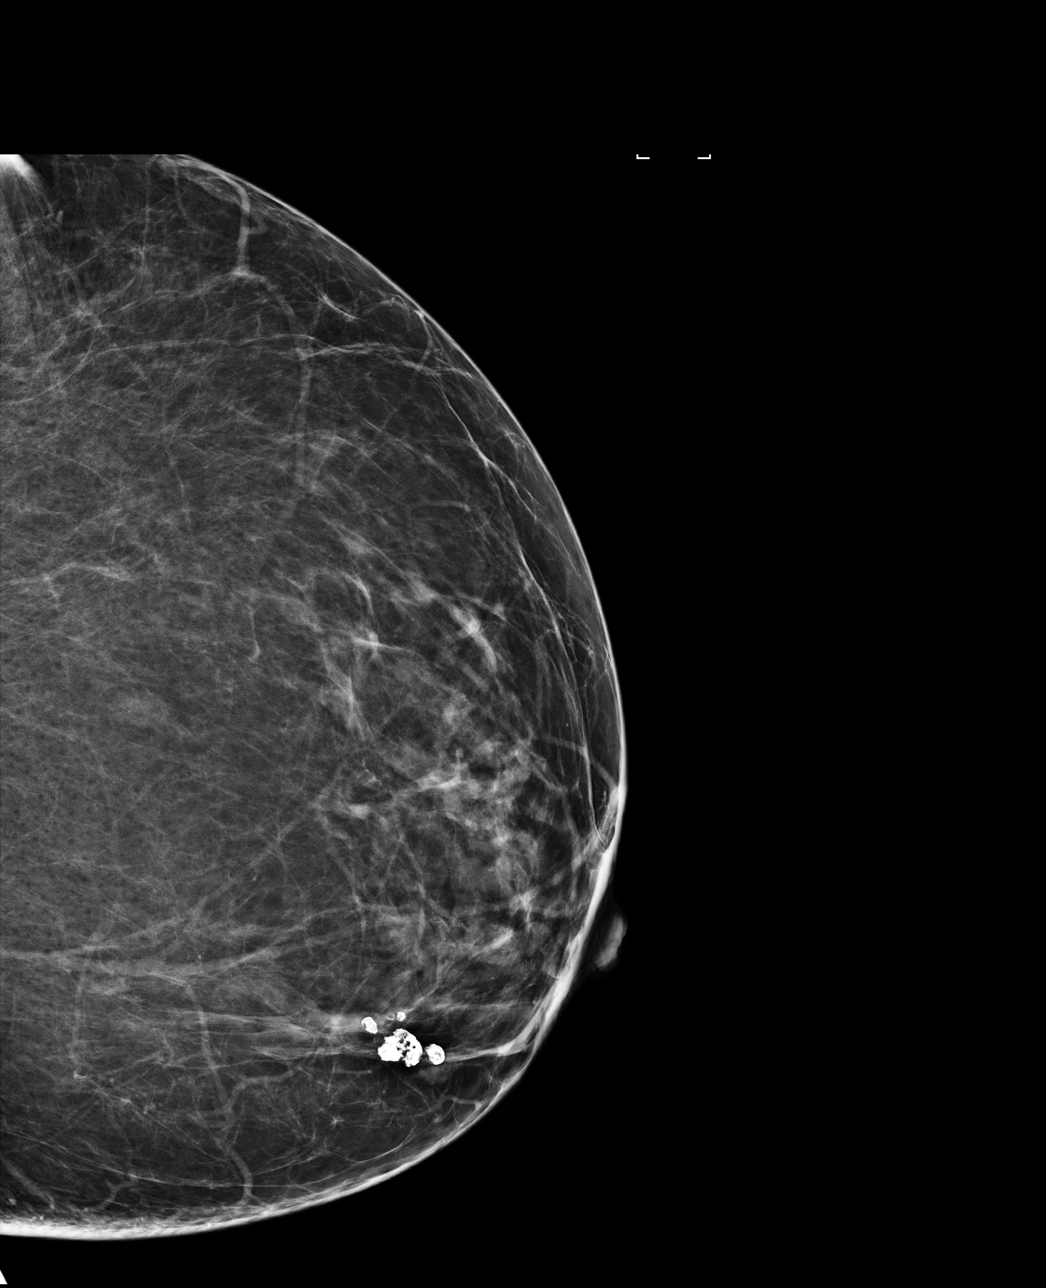

[R CC]
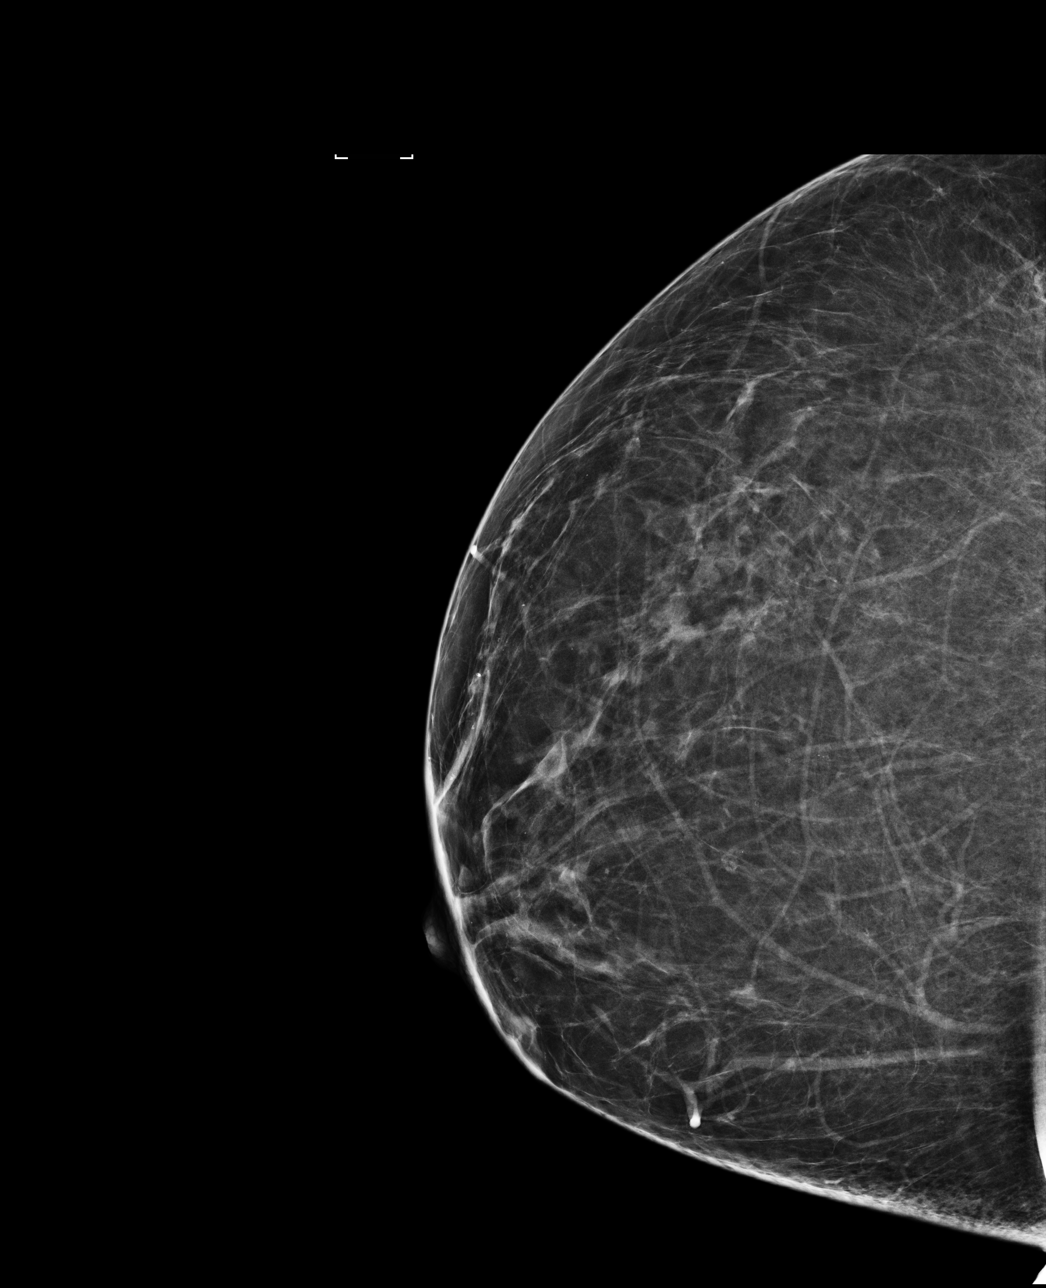

[4 of 4 positions shown; findings below may reference images not displayed]

ACR Breast Density Category b: There are scattered areas of
fibroglandular density.
FINDINGS: There are no findings suspicious for malignancy. Images were
processed with CAD.
IMPRESSION: No mammographic evidence of malignancy. A result letter of this
screening mammogram will be mailed directly to the patient.

RECOMMENDATION:
Screening mammogram in one year. (Code:[id number])

BI-RADS CATEGORY  1: Negative.

## 2020-03-07 ENCOUNTER — Other Ambulatory Visit: Payer: Self-pay

## 2020-03-07 ENCOUNTER — Encounter (HOSPITAL_BASED_OUTPATIENT_CLINIC_OR_DEPARTMENT_OTHER): Payer: BC Managed Care – PPO | Admitting: Internal Medicine

## 2020-03-07 ENCOUNTER — Ambulatory Visit (HOSPITAL_BASED_OUTPATIENT_CLINIC_OR_DEPARTMENT_OTHER): Payer: BC Managed Care – PPO | Attending: Family Medicine | Admitting: Internal Medicine

## 2020-03-07 VITALS — Ht <= 58 in | Wt 260.0 lb

## 2020-03-07 DIAGNOSIS — R0601 Orthopnea: Secondary | ICD-10-CM | POA: Insufficient documentation

## 2020-03-07 DIAGNOSIS — R0683 Snoring: Secondary | ICD-10-CM | POA: Insufficient documentation

## 2020-03-07 DIAGNOSIS — Z9189 Other specified personal risk factors, not elsewhere classified: Secondary | ICD-10-CM

## 2020-03-10 ENCOUNTER — Other Ambulatory Visit (INDEPENDENT_AMBULATORY_CARE_PROVIDER_SITE_OTHER): Payer: Self-pay | Admitting: Family Medicine

## 2020-03-10 DIAGNOSIS — E559 Vitamin D deficiency, unspecified: Secondary | ICD-10-CM

## 2020-03-12 ENCOUNTER — Other Ambulatory Visit: Payer: Self-pay | Admitting: Family Medicine

## 2020-03-12 DIAGNOSIS — I10 Essential (primary) hypertension: Secondary | ICD-10-CM

## 2020-03-19 DIAGNOSIS — R0683 Snoring: Secondary | ICD-10-CM | POA: Diagnosis not present

## 2020-03-19 NOTE — Procedures (Signed)
   Patient Name: Kim Tanner, Riggins Date: 03/07/2020 Gender: Female D.O.B: 10-18-1970 Age (years): 32 Referring Provider: Girtha Rm NP Height (inches): 82 Interpreting Physician: Baird Lyons MD, ABSM Weight (lbs): 260 RPSGT: Jacolyn Reedy BMI: 56 MRN: [id number] Neck Size: 14.00  CLINICAL INFORMATION Sleep Study Type: HST Indication for sleep study: Snoring Epworth Sleepiness Score: 14  SLEEP STUDY TECHNIQUE A multi-channel overnight portable sleep study was performed. The channels recorded were: nasal airflow, thoracic respiratory movement, and oxygen saturation with a pulse oximetry. Snoring was also monitored.  MEDICATIONS Patient self administered medications include: none reported.  SLEEP ARCHITECTURE Patient was studied for 317.1 minutes. The sleep efficiency was 100.0 % and the patient was supine for 0%. The arousal index was 0.0 per hour.  RESPIRATORY PARAMETERS The overall AHI was 24.6 per hour, with a central apnea index of 0.0 per hour.  The oxygen nadir was 82% during sleep.  CARDIAC DATA Mean heart rate during sleep was 58.0 bpm.  IMPRESSIONS - Moderate obstructive sleep apnea occurred during this study (AHI = 24.6/h). - No significant central sleep apnea occurred during this study (CAI = 0.0/h). - Moderate oxygen desaturation was noted during this study (Min O2 = 82%). - Patient snored.  DIAGNOSIS - Obstructive Sleep Apnea (327.23 [G47.33 ICD-10])  RECOMMENDATIONS - Suggest CPAP titration sleep study or autopap. Other options would be based on clinical judgment. - Be careful with alcohol, sedatives and other CNS depressants that may worsen sleep apnea and disrupt normal sleep architecture. - Sleep hygiene should be reviewed to assess factors that may improve sleep quality. - Weight management and regular exercise should be initiated or continued.  [Electronically signed] 03/19/2020 10:34 AM  Baird Lyons MD, ABSM Diplomate,  American Board of Sleep Medicine   NPI: [id number]                          [place] [place] [facility]  ELECTRONICALLY SIGNED ON:  03/19/2020, 10:30 AM [place] PH: [phone]   FX: [phone] [place]

## 2020-03-19 NOTE — Progress Notes (Signed)
She has sleep apnea and needs a CPAP autopap. Please set this up for her and give her a call with instructions. She will need a 4 week follow up after using the machine.

## 2020-03-21 ENCOUNTER — Other Ambulatory Visit: Payer: Self-pay | Admitting: Internal Medicine

## 2020-03-21 DIAGNOSIS — G4733 Obstructive sleep apnea (adult) (pediatric): Secondary | ICD-10-CM

## 2020-05-04 ENCOUNTER — Ambulatory Visit: Payer: BC Managed Care – PPO | Admitting: Family Medicine

## 2020-05-11 ENCOUNTER — Ambulatory Visit: Payer: BC Managed Care – PPO | Admitting: Family Medicine

## 2020-05-13 ENCOUNTER — Encounter: Payer: Self-pay | Admitting: Family Medicine

## 2020-05-13 ENCOUNTER — Other Ambulatory Visit: Payer: Self-pay

## 2020-05-13 ENCOUNTER — Ambulatory Visit: Payer: BC Managed Care – PPO | Admitting: Family Medicine

## 2020-05-13 VITALS — BP 140/90 | HR 68 | Temp 97.1°F | Wt 263.8 lb

## 2020-05-13 DIAGNOSIS — I10 Essential (primary) hypertension: Secondary | ICD-10-CM

## 2020-05-13 DIAGNOSIS — E559 Vitamin D deficiency, unspecified: Secondary | ICD-10-CM

## 2020-05-13 DIAGNOSIS — Z9189 Other specified personal risk factors, not elsewhere classified: Secondary | ICD-10-CM

## 2020-05-13 MED ORDER — HYDROCHLOROTHIAZIDE 25 MG PO TABS: 25.0000 mg | ORAL_TABLET | Freq: Every day | ORAL | 2 refills | Status: DC

## 2020-05-13 MED ORDER — LOSARTAN POTASSIUM 50 MG PO TABS: 50.0000 mg | ORAL_TABLET | Freq: Every day | ORAL | 2 refills | Status: DC

## 2020-05-13 NOTE — Progress Notes (Signed)
   Subjective:    Patient ID: Kim Tanner, female    DOB: [birth year], 49 y.o.   MRN: [id number]  HPI Chief Complaint  Patient presents with  . follow-up    follow-up on bp. recheck vitamin D  and 1 month of swelling and joint pain   Here to follow up on chronic health conditions.   HTN- taking medication daily. Has mostly been eating a low sodium diet.  BP at home has been 130-140s/80s  Denies LE edema. States her hands feel tight and like she is retaining fluid.   Denies fever, chills, dizziness, chest pain, palpitations, shortness of breath, abdominal pain, N/V/D, urinary symptoms, LE edema.    Sleep study showed OSA. Has not picked up her CPAP due to financial issues.   Vitamin D def- is taking a supplement   Hyperthyroidism- managed by Dr. Cruzita Lederer    Review of Systems Pertinent positives and negatives in the history of present illness.     Objective:   Physical Exam BP 140/90   Pulse 68   Temp (!) 97.1 F (36.2 C)   Wt 263 lb 12.8 oz (119.7 kg)   BMI 57.09 kg/m   Alert and in no distress. Cardiac exam shows a regular sinus rhythm without murmurs or gallops. Lungs are clear to auscultation.  Extremities without edema.  Skin is warm and dry.       Assessment & Plan:  Uncontrolled hypertension - Plan: losartan (COZAAR) 50 MG tablet, hydrochlorothiazide (HYDRODIURIL) 25 MG tablet  Vitamin D deficiency  At risk for sleep apnea  Discussed that her blood pressure is not yet to goal range.  We discussed changes to her blood pressure medication and she would prefer to increase the HCTZ to 25 mg.  States she has taken this dose in the past.  We will keep her losartan at 50 mg and I sent in a new prescription for the HCTZ. Encourage low-sodium diet. I also encouraged her to call and get her CPAP and start using it.  We discussed potential long-term health consequences associated with untreated sleep apnea.  Discussed that using a CPAP would most likely help bring  her blood pressure down. Encouraged her to keep a close eye on her blood pressure at home and let me know if her readings are not in goal range. Continue taking a vitamin D supplement. Declines labs today but will return for a fasting CPE in November.

## 2020-06-23 ENCOUNTER — Telehealth: Payer: Self-pay | Admitting: *Deleted

## 2020-06-23 NOTE — Telephone Encounter (Signed)
Recall removed,patient stated will call back if needed, not having any cardiac problem's at this time.

## 2020-07-22 ENCOUNTER — Ambulatory Visit: Payer: BC Managed Care – PPO | Admitting: Internal Medicine

## 2020-08-24 ENCOUNTER — Encounter: Payer: Self-pay | Admitting: Family Medicine

## 2020-08-24 ENCOUNTER — Other Ambulatory Visit: Payer: Self-pay

## 2020-08-24 ENCOUNTER — Ambulatory Visit (INDEPENDENT_AMBULATORY_CARE_PROVIDER_SITE_OTHER): Payer: BC Managed Care – PPO | Admitting: Family Medicine

## 2020-08-24 VITALS — BP 140/80 | HR 93 | Ht 62.0 in | Wt 257.6 lb

## 2020-08-24 DIAGNOSIS — R7303 Prediabetes: Secondary | ICD-10-CM | POA: Diagnosis not present

## 2020-08-24 DIAGNOSIS — Z9119 Patient's noncompliance with other medical treatment and regimen: Secondary | ICD-10-CM

## 2020-08-24 DIAGNOSIS — Z1159 Encounter for screening for other viral diseases: Secondary | ICD-10-CM | POA: Diagnosis not present

## 2020-08-24 DIAGNOSIS — Z0000 Encounter for general adult medical examination without abnormal findings: Secondary | ICD-10-CM | POA: Diagnosis not present

## 2020-08-24 DIAGNOSIS — J4530 Mild persistent asthma, uncomplicated: Secondary | ICD-10-CM | POA: Diagnosis not present

## 2020-08-24 DIAGNOSIS — F3289 Other specified depressive episodes: Secondary | ICD-10-CM | POA: Diagnosis not present

## 2020-08-24 DIAGNOSIS — Z1211 Encounter for screening for malignant neoplasm of colon: Secondary | ICD-10-CM | POA: Diagnosis not present

## 2020-08-24 DIAGNOSIS — E559 Vitamin D deficiency, unspecified: Secondary | ICD-10-CM

## 2020-08-24 DIAGNOSIS — I10 Essential (primary) hypertension: Secondary | ICD-10-CM

## 2020-08-24 DIAGNOSIS — E0500 Thyrotoxicosis with diffuse goiter without thyrotoxic crisis or storm: Secondary | ICD-10-CM

## 2020-08-24 DIAGNOSIS — E7800 Pure hypercholesterolemia, unspecified: Secondary | ICD-10-CM

## 2020-08-24 DIAGNOSIS — Z91199 Patient's noncompliance with other medical treatment and regimen due to unspecified reason: Secondary | ICD-10-CM

## 2020-08-24 DIAGNOSIS — G4733 Obstructive sleep apnea (adult) (pediatric): Secondary | ICD-10-CM

## 2020-08-24 NOTE — Patient Instructions (Addendum)
You can call to schedule your appointment with the counselor. A few offices are listed below for you to call.    [place] [address] [place], [place] [address]  Phone: [facility]  [address], [place], [place] [address]  Phone: [phone]  [facility] [address] [place], [place] [address] [phone] for appointments   [facility]  [address] [place], [place] [address] [place]  [place]  (across from [facility])  [place] Resident only [address] [place], [place] [address] [phone]    [facility] [address] [place], [place] [address] [phone]         Preventive Care 27-70 Years Old, Female Preventive care refers to visits with your health care provider and lifestyle choices that can promote health and wellness. This includes:  A yearly physical exam. This may also be called an annual well check.  Regular dental visits and eye exams.  Immunizations.  Screening for certain conditions.  Healthy lifestyle choices, such as eating a healthy diet, getting regular exercise, not using drugs or products that contain nicotine and tobacco, and limiting alcohol use. What can I expect for my preventive care visit? Physical exam Your health care provider will check your:  Height and weight. This may be used to calculate body mass index (BMI), which tells if you are at a healthy weight.  Heart rate and blood pressure.  Skin for abnormal spots. Counseling Your health care provider may ask you questions about your:  Alcohol, tobacco, and drug use.  Emotional well-being.  Home and relationship well-being.  Sexual activity.  Eating habits.  Work and work  Statistician.  Method of birth control.  Menstrual cycle.  Pregnancy history. What immunizations do I need?  Influenza (flu) vaccine  This is recommended every year. Tetanus, diphtheria, and pertussis (Tdap) vaccine  You may need a Td booster every 10 years. Varicella (chickenpox) vaccine  You may need this if you have not been vaccinated. Zoster (shingles) vaccine  You may need this after age 36. Measles, mumps, and rubella (MMR) vaccine  You may need at least one dose of MMR if you were born in 1957 or later. You may also need a second dose. Pneumococcal conjugate (PCV13) vaccine  You may need this if you have certain conditions and were not previously vaccinated. Pneumococcal polysaccharide (PPSV23) vaccine  You may need one or two doses if you smoke cigarettes or if you have certain conditions. Meningococcal conjugate (MenACWY) vaccine  You may need this if you have certain conditions. Hepatitis A vaccine  You may need this if you have certain conditions or if you travel or work in places where you may be exposed to hepatitis A. Hepatitis B vaccine  You may need this if you have certain conditions or if you travel or work in places where you may be exposed to hepatitis B. Haemophilus influenzae type b (Hib) vaccine  You may need this if you have certain conditions. Human papillomavirus (HPV) vaccine  If recommended by your health care provider, you may need three doses over 6 months. You may receive vaccines as individual doses or as more than one vaccine together in one shot (combination vaccines). Talk with your health care provider about the risks and benefits of combination  vaccines. What tests do I need? Blood tests  Lipid and cholesterol levels. These may be checked every 5 years, or more frequently if you are over 16 years old.  Hepatitis C test.  Hepatitis B test. Screening  Lung cancer screening. You may have this screening every year starting at  age 41 if you have a 30-pack-year history of smoking and currently smoke or have quit within the past 15 years.  Colorectal cancer screening. All adults should have this screening starting at age 27 and continuing until age 21. Your health care provider may recommend screening at age 12 if you are at increased risk. You will have tests every 1-10 years, depending on your results and the type of screening test.  Diabetes screening. This is done by checking your blood sugar (glucose) after you have not eaten for a while (fasting). You may have this done every 1-3 years.  Mammogram. This may be done every 1-2 years. Talk with your health care provider about when you should start having regular mammograms. This may depend on whether you have a family history of breast cancer.  BRCA-related cancer screening. This may be done if you have a family history of breast, ovarian, tubal, or peritoneal cancers.  Pelvic exam and Pap test. This may be done every 3 years starting at age 61. Starting at age 67, this may be done every 5 years if you have a Pap test in combination with an HPV test. Other tests  Sexually transmitted disease (STD) testing.  Bone density scan. This is done to screen for osteoporosis. You may have this scan if you are at high risk for osteoporosis. Follow these instructions at home: Eating and drinking  Eat a diet that includes fresh fruits and vegetables, whole grains, lean protein, and low-fat dairy.  Take vitamin and mineral supplements as recommended by your health care provider.  Do not drink alcohol if: ? Your health care provider tells you not to drink. ? You are pregnant, may be pregnant, or are planning to become pregnant.  If you drink alcohol: ? Limit how much you have to 0-1 drink a day. ? Be aware of how much alcohol is in your drink. In the [place] one drink equals one 12 oz bottle of beer (355 mL), one 5 oz glass of wine (148 mL), or one 1 oz glass of hard liquor  (44 mL). Lifestyle  Take daily care of your teeth and gums.  Stay active. Exercise for at least 30 minutes on 5 or more days each week.  Do not use any products that contain nicotine or tobacco, such as cigarettes, e-cigarettes, and chewing tobacco. If you need help quitting, ask your health care provider.  If you are sexually active, practice safe sex. Use a condom or other form of birth control (contraception) in order to prevent pregnancy and STIs (sexually transmitted infections).  If told by your health care provider, take low-dose aspirin daily starting at age 80. What's next?  Visit your health care provider once a year for a well check visit.  Ask your health care provider how often you should have your eyes and teeth checked.  Stay up to date on all vaccines. This information is not intended to replace advice given to you by your health care provider. Make sure you discuss any questions you have with your health care provider. Document Revised: 05/29/2018 Document Reviewed: 05/29/2018 Elsevier Patient Education  2020 [employer].

## 2020-08-24 NOTE — Progress Notes (Addendum)
Subjective:    Patient ID: Kim Tanner, female    DOB: [birth year], 49 y.o.   MRN: [id number]  HPI Chief Complaint  Patient presents with  . cpe    cpe, ear pain x 2 months   She is here for a complete physical exam and to follow up on chronic health conditions.    Other providers: [facility]- [facility]  Endocrinologist- Dr. Cruzita Lederer  Cardiologist- Dr. Gwenlyn Found   Complains of hx of right ear cerumen impaction. States she was seen at an UC and took a course of antibiotics. Pain improved.   HTN- HCTZ daily. Losartan dropped off her med list for some reason. States pharmacy did not have this for her.   Prediabetes-needs follow up   Asthma- no concerns. Is not using Advair or albuterol   HL- LDL 119 last year   Vitamin D def- MVI   OSA- is not using CPAP  Obesity- states she has been "binge eating" since October. Became tearful when telling me about this. May be treating her depression this way. Is interested in weight loss surgery.   Social history: Lives with her 47 year old. Her 54 yr old son moved out, works with [employer]  No smoking, drinking alcohol, drug use  Diet: "binge eating" since October  Excerise: nothing regular.   Immunizations: declines flu shot. Covid vaccines done. Needs booster  Health maintenance:  Mammogram: 06/25/19 Colonoscopy: had one in [place] for stomach issues, many years ago. Also had EGD Last Gynecological Exam: in the past 1-2 years. UTD per patient  Last Menstrual cycle:  2 months ago  Last Dental Exam: earlier this week  Last Eye Exam: years ago. Has reading glasses   Depression screen [facility] 2/9 08/24/2020 05/11/2019 11/18/2018 02/25/2018 06/27/2017  Decreased Interest 1 0 1 0 0  Down, Depressed, Hopeless 1 0 1 0 0  PHQ - 2 Score 2 0 2 0 0  Altered sleeping 2 - 2 - -  Tired, decreased energy 2 - 3 - -  Change in appetite 2 - 2 - -  Feeling bad or failure about yourself  0 - 0 - -  Trouble concentrating 0 - 1  - -  Moving slowly or fidgety/restless 0 - 1 - -  Suicidal thoughts 0 - 0 - -  PHQ-9 Score 8 - 11 - -  Difficult doing work/chores - - Somewhat difficult - -     Wears seatbelt always, smoke detectors in home and functioning, does not text while driving and feels safe in home environment.   Reviewed allergies, medications, past medical, surgical, family, and social history.   Review of Systems Review of Systems Constitutional: -fever, -chills, -sweats, -unexpected weight change,-fatigue ENT: -runny nose, -ear pain, -sore throat Cardiology:  -chest pain, -palpitations, -edema Respiratory: -cough, -shortness of breath, -wheezing Gastroenterology: -abdominal pain, -nausea, -vomiting, -diarrhea, -constipation  Hematology: -bleeding or bruising problems Musculoskeletal: -arthralgias, -myalgias, -joint swelling, -back pain Ophthalmology: -vision changes Urology: -dysuria, -difficulty urinating, -hematuria, -urinary frequency, -urgency Neurology: -headache, -weakness, -tingling, -numbness       Objective:   Physical Exam BP 140/80   Pulse 93   Ht 5\' 2"  (1.575 m)   Wt 257 lb 9.6 oz (116.8 kg)   BMI 47.12 kg/m   General Appearance:    Alert, cooperative, no distress, appears stated age  Head:    Normocephalic, without obvious abnormality, atraumatic  Eyes:    PERRL, conjunctiva/corneas clear, EOM's intact  Ears:    Normal TM's  and external ear canals  Nose:   Mask on   Throat:   Mask on   Neck:   Supple, no lymphadenopathy;  thyroid:  no   enlargement/tenderness/nodules; no JVD  Back:    Spine nontender, no curvature, ROM normal, no CVA     tenderness  Lungs:     Clear to auscultation bilaterally without wheezes, rales or     ronchi; respirations unlabored  Chest Wall:    No tenderness or deformity   Heart:    Regular rate and rhythm, S1 and S2 normal, no murmur, rub   or gallop  Breast Exam:    OB/GYN  Abdomen:     Soft, non-tender, nondistended, normoactive bowel sounds,     no masses, no hepatosplenomegaly  Genitalia:    OB/GYN      Extremities:   No clubbing, cyanosis or edema  Pulses:   2+ and symmetric all extremities  Skin:   Skin color, texture, turgor normal, no rashes or lesions  Lymph nodes:   Cervical, supraclavicular, and axillary nodes normal  Neurologic:   CNII-XII intact, normal strength, sensation and gait          Psych:   Normal mood, affect, hygiene and grooming.        Assessment & Plan:  Routine general medical examination at a health care facility - Plan: CBC with Differential/Platelet, Comprehensive metabolic panel, TSH, T4, free -She is here today for a physical.  Reports having coffee with cream in it this morning.  Preventive health care reviewed.  She reports being up-to-date with breast and pelvic exams including Pap smear.  Recommend regular dental and eye exams.  Counseling on healthy lifestyle including diet and exercise.  Immunizations reviewed and she declines a flu shot.  She will get her Covid booster in the next few weeks.  Discussed safety and health promotion.  Need for hepatitis C screening test - Plan: Hepatitis C antibody -Done per screening guidelines  Mild persistent asthma without complication -Well-controlled and no issues.  Currently not taking Advair.  Essential hypertension -Slightly elevated today.  She will keep an eye on this at home and let me know if her readings are consistently higher than goal range.  Recommend low-sodium diet.  Prediabetes - Plan: Hemoglobin A1c -Recommend low sugar and low carbohydrate diet.  Try to get at least 150 minutes of physical activity each week  Vitamin D deficiency - Plan: VITAMIN D 25 Hydroxy (Vit-D Deficiency, Fractures) -Currently taking a multivitamin.  Check vitamin D level and adjust dose as needed.  OSA (obstructive sleep apnea) -She is not using a CPAP and states she is aware of potential health consequences related to untreated sleep apnea including heart  failure.  Pure hypercholesterolemia -Recommend low-fat and low-cholesterol diet as well as physical activity.  Morbid obesity ([place]) - Plan: TSH, T4, free -Reports recent "binge eating".  She also expressed interest in weight loss surgery.  In-depth counseling on the need for therapy since she may be self-medicating anxiety and depression with eating.  I gave her a list of counselors and she will call and schedule.  Encouraged her to reach out and get more information about weight loss surgery if she is serious about considering this.  Depression-  Recommend scheduling with a therapist and doing more for herself. She makes everyone else a priority.   Personal history of noncompliance with medical treatment, presenting hazards to health -Noncompliance with OSA which may lead to worsening health.  We have discussed  this in depth on multiple occasions.  She has also discussed this with cardiology.  Graves disease - Plan: TSH, T4, free -managed by Dr. Cruzita Lederer

## 2020-08-25 LAB — COMPREHENSIVE METABOLIC PANEL
ALT: 15 IU/L (ref 0–32)
AST: 18 IU/L (ref 0–40)
Albumin/Globulin Ratio: 1.7 (ref 1.2–2.2)
Albumin: 4.4 g/dL (ref 3.8–4.8)
Alkaline Phosphatase: 98 IU/L (ref 44–121)
BUN/Creatinine Ratio: 11 (ref 9–23)
BUN: 13 mg/dL (ref 6–24)
Bilirubin Total: 0.3 mg/dL (ref 0.0–1.2)
CO2: 26 mmol/L (ref 20–29)
Calcium: 9.4 mg/dL (ref 8.7–10.2)
Chloride: 99 mmol/L (ref 96–106)
Creatinine, Ser: 1.2 mg/dL — ABNORMAL HIGH (ref 0.57–1.00)
GFR calc Af Amer: 62 mL/min/{1.73_m2} (ref >59)
GFR calc non Af Amer: 54 mL/min/{1.73_m2} — ABNORMAL LOW (ref >59)
Globulin, Total: 2.6 g/dL (ref 1.5–4.5)
Glucose: 140 mg/dL — ABNORMAL HIGH (ref 65–99)
Potassium: 4 mmol/L (ref 3.5–5.2)
Sodium: 140 mmol/L (ref 134–144)
Total Protein: 7 g/dL (ref 6.0–8.5)

## 2020-08-25 LAB — CBC WITH DIFFERENTIAL/PLATELET
Basophils Absolute: 0.1 10*3/uL (ref 0.0–0.2)
Basos: 1 %
EOS (ABSOLUTE): 0.1 10*3/uL (ref 0.0–0.4)
Eos: 2 %
Hematocrit: 34.5 % (ref 34.0–46.6)
Hemoglobin: 11.5 g/dL (ref 11.1–15.9)
Immature Grans (Abs): 0 10*3/uL (ref 0.0–0.1)
Immature Granulocytes: 0 %
Lymphocytes Absolute: 1.8 10*3/uL (ref 0.7–3.1)
Lymphs: 30 %
MCH: 29.3 pg (ref 26.6–33.0)
MCHC: 33.3 g/dL (ref 31.5–35.7)
MCV: 88 fL (ref 79–97)
Monocytes Absolute: 0.3 10*3/uL (ref 0.1–0.9)
Monocytes: 4 %
Neutrophils Absolute: 3.7 10*3/uL (ref 1.4–7.0)
Neutrophils: 63 %
Platelets: 227 10*3/uL (ref 150–450)
RBC: 3.92 x10E6/uL (ref 3.77–5.28)
RDW: 13.4 % (ref 11.7–15.4)
WBC: 6 10*3/uL (ref 3.4–10.8)

## 2020-08-25 LAB — HEMOGLOBIN A1C
Est. average glucose Bld gHb Est-mCnc: 126 mg/dL
Hgb A1c MFr Bld: 6 % — ABNORMAL HIGH (ref 4.8–5.6)

## 2020-08-25 LAB — LIPID PANEL
Chol/HDL Ratio: 5.7 ratio — ABNORMAL HIGH (ref 0.0–4.4)
Cholesterol, Total: 260 mg/dL — ABNORMAL HIGH (ref 100–199)
HDL: 46 mg/dL (ref >39)
LDL Chol Calc (NIH): 193 mg/dL — ABNORMAL HIGH (ref 0–99)
Triglycerides: 119 mg/dL (ref 0–149)
VLDL Cholesterol Cal: 21 mg/dL (ref 5–40)

## 2020-08-25 LAB — TSH: TSH: 74.8 u[IU]/mL — ABNORMAL HIGH (ref 0.450–4.500)

## 2020-08-25 LAB — HEPATITIS C ANTIBODY: Hep C Virus Ab: <0.1 s/co ratio (ref 0.0–0.9)

## 2020-08-25 LAB — T4, FREE: Free T4: 0.57 ng/dL — ABNORMAL LOW (ref 0.82–1.77)

## 2020-08-25 LAB — VITAMIN D 25 HYDROXY (VIT D DEFICIENCY, FRACTURES): Vit D, 25-Hydroxy: 13.5 ng/mL — ABNORMAL LOW (ref 30.0–100.0)

## 2020-08-28 ENCOUNTER — Other Ambulatory Visit: Payer: Self-pay | Admitting: Family Medicine

## 2020-08-28 DIAGNOSIS — E559 Vitamin D deficiency, unspecified: Secondary | ICD-10-CM

## 2020-08-28 MED ORDER — VITAMIN D (ERGOCALCIFEROL) 1.25 MG (50000 UNIT) PO CAPS: 50000.0000 [IU] | ORAL_CAPSULE | ORAL | 0 refills | Status: DC

## 2020-08-28 NOTE — Progress Notes (Signed)
Our mutual patient. She has an appointment with you on 08/30/2020. I checked her labs at her recent CPE and I am forwarding her results to you. Thanks. Quadre [place]

## 2020-08-28 NOTE — Progress Notes (Signed)
She needs to start on a statin to lower her cholesterol and her risk for heart disease and stroke. Is she willing to start on one? I will send this in if so and she needs to see me in office 6 weeks after starting it. She should come in fasting at that 6 week follow up visit. I sent in vitamin D also for her to take. I sent her thyroid labs to Dr. Cruzita Lederer.

## 2020-08-29 ENCOUNTER — Other Ambulatory Visit: Payer: Self-pay | Admitting: Family Medicine

## 2020-08-29 DIAGNOSIS — E7800 Pure hypercholesterolemia, unspecified: Secondary | ICD-10-CM

## 2020-08-29 MED ORDER — ROSUVASTATIN CALCIUM 10 MG PO TABS: 10.0000 mg | ORAL_TABLET | Freq: Every day | ORAL | 1 refills | Status: DC

## 2020-08-30 ENCOUNTER — Encounter: Payer: Self-pay | Admitting: Internal Medicine

## 2020-08-30 ENCOUNTER — Ambulatory Visit (INDEPENDENT_AMBULATORY_CARE_PROVIDER_SITE_OTHER): Payer: BC Managed Care – PPO | Admitting: Internal Medicine

## 2020-08-30 ENCOUNTER — Other Ambulatory Visit: Payer: Self-pay

## 2020-08-30 VITALS — BP 130/90 | HR 68 | Ht 62.0 in | Wt 261.4 lb

## 2020-08-30 DIAGNOSIS — E041 Nontoxic single thyroid nodule: Secondary | ICD-10-CM

## 2020-08-30 DIAGNOSIS — E890 Postprocedural hypothyroidism: Secondary | ICD-10-CM | POA: Diagnosis not present

## 2020-08-30 MED ORDER — LEVOTHYROXINE SODIUM 100 MCG PO TABS: 100.0000 ug | ORAL_TABLET | Freq: Every day | ORAL | 3 refills | Status: DC

## 2020-08-30 NOTE — Patient Instructions (Addendum)
Please increase levothyroxine to 100 mcg daily.  Take the thyroid hormone every day, with water, at least 30 minutes before breakfast, separated by at least 4 hours from: - acid reflux medications - calcium - iron - multivitamins  Please come back for labs in 5 weeks.  Please return for another visit in 3 months.

## 2020-08-30 NOTE — Progress Notes (Signed)
Patient ID: Ashtynn Berke, female   DOB: May 04, 1971, 49 y.o.   MRN: [id number]   This visit occurred during the SARS-CoV-2 public health emergency.  Safety protocols were in place, including screening questions prior to the visit, additional usage of staff PPE, and extensive cleaning of exam room while observing appropriate contact time as indicated for disinfecting solutions.   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  Last visit 7 months ago.  Reviewed and addended history: Patient was diagnosed with thyrotoxicosis in 11/2016.  She was referred to endocrinology then, but could not come at that time, and only got to see me for the first time in 11/2018.  At last visit, her TFTs were still thyrotoxic and her TSI antibodies were elevated, confirming Graves' disease.  We started methimazole 5 mg twice a day, but she did not come back for labs 1.5 months later...  She had another set of TFTs by PCP in 05/2019 and these appeared worse.    She tells me that before her labs in 05/2019, she was missing 2nd dose of MMI at the day.  We contacted her to start taking the medication consistently.  At last visit she was taking 5 mg twice a day.  Since then, we checked a thyroid uptake and scan (07/28/2019) and this was positive for Graves' disease: 4 hour I-123 uptake = 39%% (normal 5-20%) 24 hour I-123 uptake = 60%% (normal 10-30%)  IMPRESSION: Elevated 4 hour and 24 hour radio iodine uptakes consistent with hyperthyroidism. No focal scintigraphic abnormalities on imaging. Findings consistent with Graves disease.  She had RAI treatment (08/31/2019).  She felt well after RAI treatment, without complaints, however, she developed post ablation hypothyroidism in 12/2019.  We started levothyroxine 50 mcg daily and advised her to come back in  5 to 6 weeks for labs but she did not return. She saw PCP 7 months later and at that time TSH was still extremely high, at 74.8.  Pt is on levothyroxine 50 mcg daily, taken: - in am - fasting - coffee + cream + sugar 1h later - ~2h from b'fast - no calcium - no iron - + multivitamins at night now, prev. 1h after LT4 - no PPIs - + on Biotin 30 mcg  - B complex  Reviewed her TFTs: Lab Results  Component Value Date   TSH 74.800 (H) 08/24/2020   TSH 90.54 Repeated and verified X2. (H) 01/21/2020   TSH <0.005 (L) 11/11/2019   TSH <0.01 (L) 07/14/2019   TSH <0.005 (L) 05/11/2019   TSH <0.01 (L) 12/19/2018   TSH <0.006 (L) 11/18/2018   TSH <0.006 (L) 09/16/2018   TSH <0.006 (L) 02/25/2018   TSH 0.01 (L) 08/21/2017   FREET4 0.57 (L) 08/24/2020   FREET4 0.19 (L) 01/21/2020   FREET4 1.09 11/11/2019   FREET4 1.34 07/14/2019   FREET4 2.37 (H) 05/11/2019   FREET4 1.53 12/19/2018   FREET4 2.14 (H) 11/18/2018   FREET4 1.69 09/16/2018   FREET4 1.63 02/25/2018   FREET4 1.4 08/21/2017   T3FREE 2.0 (L) 01/21/2020   T3FREE 3.8 07/14/2019   T3FREE 5.3 (H) 12/19/2018   T3FREE 3.8 12/26/2016   Her TSI's were elevated confirming Graves' disease: Lab Results  Component Value Date   TSI 386 (H) 12/19/2018    Thyroid U/S (01/21/2017): Moderately heterogeneous thyroid of normal size, with a 1 cm nodule  Pt denies: - feeling nodules in neck - hoarseness - dysphagia - choking - SOB with lying down  No FH of thyroid cancer. No h/o radiation tx to head or neck except RAI treatment..  No seaweed or kelp. No recent contrast studies. No herbal supplements. No Biotin use. No recent steroids use.   She also has a history of anxiety.  She saw Dr. Adair Patter in the weight management clinic. She is seeing at the [facility]. She tried Phentermine >> felt poorly >> stopped.  She will try another medication but she cannot remember the name.  She has a history of prediabetes and was started on  Metformin by Dr. Leafy Ro 11/2018-now off.  Reviewed HbA1c levels: Lab Results  Component Value Date   HGBA1C 6.0 (H) 08/24/2020   HGBA1C 5.6 05/11/2019   HGBA1C 5.5 11/18/2018   She has a history of HTN-on HCTZ.   She was recently diagnosed with hyperlipidemia and started on Crestor 10 (she will pick it up from the pharmacy today).  ROS: Constitutional: no weight gain/no weight loss, + fatigue, no subjective hyperthermia, no subjective hypothermia Eyes: no blurry vision, no xerophthalmia ENT: no sore throat, + see HPI Cardiovascular: no CP/no SOB/no palpitations/no leg swelling Respiratory: no cough/no SOB/no wheezing Gastrointestinal: no N/no V/no D/no C/no acid reflux Musculoskeletal: no muscle aches/no joint aches Skin: no rashes, no hair loss Neurological: no tremors/no numbness/no tingling/no dizziness  I reviewed pt's medications, allergies, PMH, social hx, family hx, and changes were documented in the history of present illness. Otherwise, unchanged from my initial visit note.  Past Medical History:  Diagnosis Date  . Anxiety   . Asthma   . Back pain   . Dyspnea   . HTN (hypertension)   . Joint pain   . Lower extremity edema   . Metabolic syndrome   . Morbid obesity ([place])   . Prediabetes    Past Surgical History:  Procedure Laterality Date  . BREAST REDUCTION SURGERY  2008  . CESAREAN SECTION    . DILATION AND CURETTAGE OF UTERUS    . INDUCED ABORTION    . REDUCTION MAMMAPLASTY    . uterine ablation     Social History   Socioeconomic History  . Marital status: Single    Spouse name: Not on file  . Number of children: 2  . Years of education: Not on file  . Highest education level: Not on file  Occupational History  . 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    Employer: [employer]  Tobacco Use  . Smoking status: Never Smoker  . Smokeless tobacco: Never Used  Vaping Use  . Vaping Use: Never used  Substance and Sexual Activity  . Alcohol  use: Not Currently    Comment: socially   . Drug use: No  . Sexual activity: Not Currently    Birth control/protection: Condom  Other Topics Concern  . Not on file  Social History Narrative  . Not on file   Social Determinants of Health   Financial Resource Strain:   . Difficulty of Paying Living Expenses: Not on file  Food Insecurity:   . Worried About Charity fundraiser in the Last Year: Not on file  . Ran Out of Food in the Last Year: Not on file  Transportation Needs:   . Lack of Transportation (Medical): Not on file  . Lack of Transportation (Non-Medical): Not on file  Physical Activity:   . Days of Exercise per Week: Not on file  .  Minutes of Exercise per Session: Not on file  Stress:   . Feeling of Stress : Not on file  Social Connections:   . Frequency of Communication with Friends and Family: Not on file  . Frequency of Social Gatherings with Friends and Family: Not on file  . Attends Religious Services: Not on file  . Active Member of Clubs or Organizations: Not on file  . Attends Archivist Meetings: Not on file  . Marital Status: Not on file  Intimate Partner Violence:   . Fear of Current or Ex-Partner: Not on file  . Emotionally Abused: Not on file  . Physically Abused: Not on file  . Sexually Abused: Not on file   Current Outpatient Medications on File Prior to Visit  Medication Sig Dispense Refill  . albuterol (PROVENTIL HFA;VENTOLIN HFA) 108 (90 Base) MCG/ACT inhaler Inhale 2 puffs into the lungs every 6 (six) hours as needed for wheezing or shortness of breath. 1 Inhaler 1  . Fluticasone-Salmeterol (ADVAIR DISKUS) 100-50 MCG/DOSE AEPB Inhale 1 puff into the lungs 2 (two) times daily. 60 each 1  . hydrochlorothiazide (HYDRODIURIL) 25 MG tablet Take 1 tablet (25 mg total) by mouth daily. 30 tablet 2  . levothyroxine (SYNTHROID) 50 MCG tablet Take 1 tablet (50 mcg total) by mouth daily. 45 tablet 5  . losartan (COZAAR) 50 MG tablet Take 1 tablet  (50 mg total) by mouth daily. (Patient not taking: Reported on 08/24/2020) 30 tablet 2  . meclizine (ANTIVERT) 25 MG tablet TAKE 1 TABLET(25 MG) BY MOUTH TWICE DAILY AS NEEDED FOR DIZZINESS 30 tablet 0  . rosuvastatin (CRESTOR) 10 MG tablet Take 1 tablet (10 mg total) by mouth daily. 90 tablet 1  . Vitamin D, Ergocalciferol, (DRISDOL) 1.25 MG (50000 UNIT) CAPS capsule Take 1 capsule (50,000 Units total) by mouth every 7 (seven) days. 12 capsule 0   No current facility-administered medications on file prior to visit.   Allergies  Allergen Reactions  . Compazine [Prochlorperazine Edisylate]    Family History  Problem Relation Age of Onset  . Hypertension Mother   . Obesity Mother   . Hypertension Father   . Diabetes Father   . Diabetes Sister   . Stroke Sister 43  . Breast cancer Neg Hx    PE: BP 130/90   Pulse 68   Ht 5\' 2"  (1.575 m)   Wt 261 lb 6.4 oz (118.6 kg)   SpO2 99%   BMI 47.81 kg/m  Wt Readings from Last 3 Encounters:  08/30/20 261 lb 6.4 oz (118.6 kg)  08/24/20 257 lb 9.6 oz (116.8 kg)  05/13/20 263 lb 12.8 oz (119.7 kg)   Constitutional: overweight, in NAD Eyes: PERRLA, EOMI, no exophthalmos ENT: moist mucous membranes, + mild, symmetric thyromegaly, no cervical lymphadenopathy Cardiovascular: RRR, No MRG Respiratory: CTA B Gastrointestinal: abdomen soft, NT, ND, BS+ Musculoskeletal: no deformities, strength intact in all 4 Skin: moist, warm, no rashes except acanthosis nigricans on neck, also skin tags on neck Neurological: no tremor with outstretched hands, DTR normal in all 4  ASSESSMENT: 1.  Graves' disease -Now status post RAI treatment  2.  Right thyroid nodule  PLAN:  1. Patient with a long history of untreated thyrotoxicosis since at least 2018, without thyrotoxic symptoms.  She denied weight loss, heat intolerance, hyper defecation, palpitations, anxiety.  However, she did have decreased energy and hair loss.  We checked her Graves' antibodies in  the past and they were elevated, pointing towards Graves' disease.  We tried methimazole but she was not very compliant with the medication and labs and her TFTs remain abnormal.  I again suggested RAI treatment.  We first obtained a thyroid uptake and scan in 07/2019 and this confirms Graves' disease and no cold nodules. She had RAI treatment on 08/31/2019. Her TSH was still suppressed initially after RAI treatment but did increase significantly to a value of 90 afterwards. -At that time, we started levothyroxine 50 mcg daily and I advised her to come back for labs to adjust the dose up. She did not return for these...   -She had labs with PCP a week ago and the TSH was still very high: Lab Results  Component Value Date   TSH 74.800 (H) 08/24/2020  -At today's visit, we discussed about possible consequences of uncontrolled hypothyroidism to include weight gain, fatigue, depression, hyperlipidemia and also bradycardia, heart block, pericarditis, hypertension. -She has hyperlipidemia and was recommended to start a statin.  For now, encouraged her to get the statin, but I believe she can be given a trial off Crestor after her hypothyroidism improves - she continues on LT4 50 mcg daily, but we will go ahead and increase the dose to 100 mcg daily. - we discussed about taking the thyroid hormone every day, with water, >30 minutes before breakfast, separated by >4 hours from acid reflux medications, calcium, iron, multivitamins. Pt. is taking it correctly now, but she was taking multivitamins only 1 hour after levothyroxine up until a week ago.  - will check thyroid tests in 1.5 months: TSH and fT4 -I will see her back in 3 months but sooner for labs  2.  Right thyroid nodule -This measures 1 cm and was seen on the thyroid ultrasound from 2018 -No neck compression symptoms -this appears not to be a worrisome nodule and we can continue to follow her clinically   Philemon Kingdom, MD PhD [facility]  Endocrinology

## 2020-10-07 ENCOUNTER — Ambulatory Visit: Payer: BC Managed Care – PPO | Admitting: Family Medicine

## 2020-11-18 ENCOUNTER — Other Ambulatory Visit: Payer: Self-pay | Admitting: Family Medicine

## 2020-11-18 DIAGNOSIS — E559 Vitamin D deficiency, unspecified: Secondary | ICD-10-CM

## 2020-11-18 NOTE — Telephone Encounter (Signed)
Pt has upcoming appt 

## 2020-11-21 ENCOUNTER — Ambulatory Visit: Payer: Self-pay | Admitting: Family Medicine

## 2020-11-22 ENCOUNTER — Ambulatory Visit: Payer: Self-pay | Admitting: Family Medicine

## 2020-11-24 ENCOUNTER — Other Ambulatory Visit: Payer: Self-pay | Admitting: Family Medicine

## 2020-11-24 DIAGNOSIS — I10 Essential (primary) hypertension: Secondary | ICD-10-CM

## 2020-12-05 ENCOUNTER — Ambulatory Visit: Payer: BC Managed Care – PPO | Admitting: Family Medicine

## 2020-12-05 ENCOUNTER — Other Ambulatory Visit: Payer: Self-pay

## 2020-12-05 ENCOUNTER — Encounter: Payer: Self-pay | Admitting: Family Medicine

## 2020-12-05 VITALS — BP 142/92 | HR 71 | Temp 96.5°F | Wt 259.6 lb

## 2020-12-05 DIAGNOSIS — I10 Essential (primary) hypertension: Secondary | ICD-10-CM | POA: Diagnosis not present

## 2020-12-05 DIAGNOSIS — E7800 Pure hypercholesterolemia, unspecified: Secondary | ICD-10-CM | POA: Diagnosis not present

## 2020-12-05 DIAGNOSIS — R7989 Other specified abnormal findings of blood chemistry: Secondary | ICD-10-CM

## 2020-12-05 DIAGNOSIS — E559 Vitamin D deficiency, unspecified: Secondary | ICD-10-CM | POA: Diagnosis not present

## 2020-12-05 DIAGNOSIS — G4733 Obstructive sleep apnea (adult) (pediatric): Secondary | ICD-10-CM

## 2020-12-05 NOTE — Patient Instructions (Signed)
Keep an eye on your BP at home for the next 4 weeks and but back on salt.  Bring in your BP machine and readings for your appointment in 4 weeks.   Cut back on foods high in fat, fried foods and white foods.  Continue on your current medications.   I will be in touch with lab results.       [email or web address]">  DASH Eating Plan DASH stands for Dietary Approaches to Stop Hypertension. The DASH eating plan is a healthy eating plan that has been shown to:  Reduce high blood pressure (hypertension).  Reduce your risk for type 2 diabetes, heart disease, and stroke.  Help with weight loss. What are tips for following this plan? Reading food labels  Check food labels for the amount of salt (sodium) per serving. Choose foods with less than 5 percent of the Daily Value of sodium. Generally, foods with less than 300 milligrams (mg) of sodium per serving fit into this eating plan.  To find whole grains, look for the word "whole" as the first word in the ingredient list. Shopping  Buy products labeled as "low-sodium" or "no salt added."  Buy fresh foods. Avoid canned foods and pre-made or frozen meals. Cooking  Avoid adding salt when cooking. Use salt-free seasonings or herbs instead of table salt or sea salt. Check with your health care provider or pharmacist before using salt substitutes.  Do not fry foods. Cook foods using healthy methods such as baking, boiling, grilling, roasting, and broiling instead.  Cook with heart-healthy oils, such as olive, canola, avocado, soybean, or sunflower oil. Meal planning  Eat a balanced diet that includes: ? 4 or more servings of fruits and 4 or more servings of vegetables each day. Try to fill one-half of your plate with fruits and vegetables. ? 6-8 servings of whole grains each day. ? Less than 6 oz (170 g) of lean meat, poultry, or fish each day. A 3-oz (85-g) serving of meat is about the same  size as a deck of cards. One egg equals 1 oz (28 g). ? 2-3 servings of low-fat dairy each day. One serving is 1 cup (237 mL). ? 1 serving of nuts, seeds, or beans 5 times each week. ? 2-3 servings of heart-healthy fats. Healthy fats called omega-3 fatty acids are found in foods such as walnuts, flaxseeds, fortified milks, and eggs. These fats are also found in cold-water fish, such as sardines, salmon, and mackerel.  Limit how much you eat of: ? Canned or prepackaged foods. ? Food that is high in trans fat, such as some fried foods. ? Food that is high in saturated fat, such as fatty meat. ? Desserts and other sweets, sugary drinks, and other foods with added sugar. ? Full-fat dairy products.  Do not salt foods before eating.  Do not eat more than 4 egg yolks a week.  Try to eat at least 2 vegetarian meals a week.  Eat more home-cooked food and less restaurant, buffet, and fast food.   Lifestyle  When eating at a restaurant, ask that your food be prepared with less salt or no salt, if possible.  If you drink alcohol: ? Limit how much you use to:  0-1 drink a day for women who are not pregnant.  0-2 drinks a day for men. ? Be aware of how much alcohol is in your drink. In the [place] one drink equals one 12 oz bottle of beer (355 mL), one 5 oz glass  of wine (148 mL), or one 1 oz glass of hard liquor (44 mL). General information  Avoid eating more than 2,300 mg of salt a day. If you have hypertension, you may need to reduce your sodium intake to 1,500 mg a day.  Work with your health care provider to maintain a healthy body weight or to lose weight. Ask what an ideal weight is for you.  Get at least 30 minutes of exercise that causes your heart to beat faster (aerobic exercise) most days of the week. Activities may include walking, swimming, or biking.  Work with your health care provider or dietitian to adjust your eating plan to your individual calorie needs. What foods should I  eat? Fruits All fresh, dried, or frozen fruit. Canned fruit in natural juice (without added sugar). Vegetables Fresh or frozen vegetables (raw, steamed, roasted, or grilled). Low-sodium or reduced-sodium tomato and vegetable juice. Low-sodium or reduced-sodium tomato sauce and tomato paste. Low-sodium or reduced-sodium canned vegetables. Grains Whole-grain or whole-wheat bread. Whole-grain or whole-wheat pasta. Brown rice. [place]. Bulgur. Whole-grain and low-sodium cereals. Pita bread. Low-fat, low-sodium crackers. Whole-wheat flour tortillas. Meats and other proteins Skinless chicken or [place]. Ground chicken or [place]. Pork with fat trimmed off. Fish and seafood. Egg whites. Dried beans, peas, or lentils. Unsalted nuts, nut butters, and seeds. Unsalted canned beans. Lean cuts of beef with fat trimmed off. Low-sodium, lean precooked or cured meat, such as sausages or meat loaves. Dairy Low-fat (1%) or fat-free (skim) milk. Reduced-fat, low-fat, or fat-free cheeses. Nonfat, low-sodium ricotta or cottage cheese. Low-fat or nonfat yogurt. Low-fat, low-sodium cheese. Fats and oils Soft margarine without trans fats. Vegetable oil. Reduced-fat, low-fat, or light mayonnaise and salad dressings (reduced-sodium). Canola, safflower, olive, avocado, soybean, and sunflower oils. Avocado. Seasonings and condiments Herbs. Spices. Seasoning mixes without salt. Other foods Unsalted popcorn and pretzels. Fat-free sweets. The items listed above may not be a complete list of foods and beverages you can eat. Contact a dietitian for more information. What foods should I avoid? Fruits Canned fruit in a light or heavy syrup. Fried fruit. Fruit in cream or butter sauce. Vegetables Creamed or fried vegetables. Vegetables in a cheese sauce. Regular canned vegetables (not low-sodium or reduced-sodium). Regular canned tomato sauce and paste (not low-sodium or reduced-sodium). Regular tomato and vegetable juice  (not low-sodium or reduced-sodium). Angie Fava. Olives. Grains Baked goods made with fat, such as croissants, muffins, or some breads. Dry pasta or rice meal packs. Meats and other proteins Fatty cuts of meat. Ribs. Fried meat. Berniece Salines. Bologna, salami, and other precooked or cured meats, such as sausages or meat loaves. Fat from the back of a pig (fatback). Bratwurst. Salted nuts and seeds. Canned beans with added salt. Canned or smoked fish. Whole eggs or egg yolks. Chicken or [place] with skin. Dairy Whole or 2% milk, cream, and half-and-half. Whole or full-fat cream cheese. Whole-fat or sweetened yogurt. Full-fat cheese. Nondairy creamers. Whipped toppings. Processed cheese and cheese spreads. Fats and oils Butter. Stick margarine. Lard. Shortening. Ghee. Bacon fat. Tropical oils, such as coconut, palm kernel, or palm oil. Seasonings and condiments Onion salt, garlic salt, seasoned salt, table salt, and sea salt. Worcestershire sauce. Tartar sauce. Barbecue sauce. Teriyaki sauce. Soy sauce, including reduced-sodium. Steak sauce. Canned and packaged gravies. Fish sauce. Oyster sauce. Cocktail sauce. Store-bought horseradish. Ketchup. Mustard. Meat flavorings and tenderizers. Bouillon cubes. Hot sauces. Pre-made or packaged marinades. Pre-made or packaged taco seasonings. Relishes. Regular salad dressings. Other foods Salted popcorn and pretzels. The  items listed above may not be a complete list of foods and beverages you should avoid. Contact a dietitian for more information. Where to find more information  National Heart, Lung, and Blood Institute: https://wilson-eaton.com/  American Heart Association: www.heart.org  Academy of Nutrition and Dietetics: www.eatright.[place]: www.kidney.org Summary  The DASH eating plan is a healthy eating plan that has been shown to reduce high blood pressure (hypertension). It may also reduce your risk for type 2 diabetes, heart disease, and  stroke.  When on the DASH eating plan, aim to eat more fresh fruits and vegetables, whole grains, lean proteins, low-fat dairy, and heart-healthy fats.  With the DASH eating plan, you should limit salt (sodium) intake to 2,300 mg a day. If you have hypertension, you may need to reduce your sodium intake to 1,500 mg a day.  Work with your health care provider or dietitian to adjust your eating plan to your individual calorie needs. This information is not intended to replace advice given to you by your health care provider. Make sure you discuss any questions you have with your health care provider. Document Revised: 08/21/2019 Document Reviewed: 08/21/2019 Elsevier Patient Education  2021 [employer].

## 2020-12-05 NOTE — Progress Notes (Signed)
Subjective:    Patient ID: Kim Tanner, female    DOB: 10/12/1970, 50 y.o.   MRN: [id number]  HPI Chief Complaint  Patient presents with  . follow up on choles. lab    Pt is ok on new med and is fasting today   She is here for follow-up on LDL greater than 190 and starting on a statin.  We are also following up on her blood pressure which has been uncontrolled as well as vitamin D deficiency.  States she stopped all of her medications including her BP medication for 3 weeks when she had Covid in January.  Reports starting back on her medications the end of January.  Hypertension-she has not been checking her blood pressure at home except for 1 day recently and she states it was in goal range. Reports her diet has been high in sodium particularly eating more [place] fries.  Diagnosed with OSA last year per patient.  States she did not get the CPAP and equipment so she has never used it for her sleep apnea.  She is interested in starting on CPAP.  States she completed the once weekly prescription of the vitamin D but has not been taking a supplement since then.  She would like to check her vitamin D level again today.  Denies fever, chills, dizziness, chest pain, palpitations, shortness of breath, abdominal pain, N/V/D,  LE edema.   Reports having urinary frequency which has been the case in the past as well.  States she is on the fluid medication.  Reviewed allergies, medications, past medical, surgical, family, and social history.   Review of Systems Pertinent positives and negatives in the history of present illness.     Objective:   Physical Exam BP (!) 142/92   Pulse 71   Temp (!) 96.5 F (35.8 C)   Wt 259 lb 9.6 oz (117.8 kg)   SpO2 97%   BMI 47.48 kg/m   Alert and oriented in no acute distress.  Respirations unlabored.  Normal mood and memory.      Assessment & Plan:  Hypercholesterolemia with LDL greater than 190 mg/dL - Plan: Lipid panel -She is fasting  today so I will recheck her lipid panel.  Reports taking statin daily since the end of January.  Her food choices have been high in fat and we discussed cutting back on fatty foods, fried foods and white foods.  Also recommend increasing her physical activity.  Follow-up pending lipid panel results. Counseling done on health consequences associated with hyperlipidemia.  Uncontrolled hypertension - Plan: Basic metabolic panel -Her blood pressure is elevated today.  She did not take her medication today.  Discussed health consequences associated with elevated hypertension.  Recent diet has been high in salt.  Encouraged her to check her blood pressure regularly at home over the next 4 weeks and return with her blood pressure machine and readings.  Vitamin D deficiency - Plan: VITAMIN D 25 Hydroxy (Vit-D Deficiency, Fractures) -Unclear as to when she completed the prescription vitamin D or if she took all 12 weeks of it.  I will check her vitamin D level today and recommend vitamin D supplement as appropriate.  Morbid obesity ([place]) -Recommend weight loss by cutting back on calories, white food, foods high in fat and fried foods.  I also recommend increasing her physical activity.  Elevated serum creatinine - Plan: Basic metabolic panel -Continue to monitor.  Discussed importance of getting blood pressure under good control.  OSA (obstructive  sleep apnea) -Sleep study positive for OSA but she did not start using a CPAP or get the equipment.  She is now interested in using a CPAP.  I think she would benefit from this especially her uncontrolled hypertension could be improved.

## 2020-12-06 LAB — LIPID PANEL
Chol/HDL Ratio: 3.8 ratio (ref 0.0–4.4)
Cholesterol, Total: 190 mg/dL (ref 100–199)
HDL: 50 mg/dL (ref >39)
LDL Chol Calc (NIH): 128 mg/dL — ABNORMAL HIGH (ref 0–99)
Triglycerides: 65 mg/dL (ref 0–149)
VLDL Cholesterol Cal: 12 mg/dL (ref 5–40)

## 2020-12-06 LAB — BASIC METABOLIC PANEL
BUN/Creatinine Ratio: 11 (ref 9–23)
BUN: 14 mg/dL (ref 6–24)
CO2: 25 mmol/L (ref 20–29)
Calcium: 9.3 mg/dL (ref 8.7–10.2)
Chloride: 98 mmol/L (ref 96–106)
Creatinine, Ser: 1.24 mg/dL — ABNORMAL HIGH (ref 0.57–1.00)
Glucose: 97 mg/dL (ref 65–99)
Potassium: 3.8 mmol/L (ref 3.5–5.2)
Sodium: 139 mmol/L (ref 134–144)
eGFR: 54 mL/min/{1.73_m2} — ABNORMAL LOW (ref >59)

## 2020-12-06 LAB — VITAMIN D 25 HYDROXY (VIT D DEFICIENCY, FRACTURES): Vit D, 25-Hydroxy: 25.6 ng/mL — ABNORMAL LOW (ref 30.0–100.0)

## 2020-12-12 ENCOUNTER — Encounter: Payer: Self-pay | Admitting: Internal Medicine

## 2020-12-12 ENCOUNTER — Ambulatory Visit (INDEPENDENT_AMBULATORY_CARE_PROVIDER_SITE_OTHER): Payer: BC Managed Care – PPO | Admitting: Internal Medicine

## 2020-12-12 VITALS — BP 120/78 | HR 76 | Ht 62.0 in | Wt 264.6 lb

## 2020-12-12 DIAGNOSIS — E890 Postprocedural hypothyroidism: Secondary | ICD-10-CM

## 2020-12-12 DIAGNOSIS — R7309 Other abnormal glucose: Secondary | ICD-10-CM

## 2020-12-12 DIAGNOSIS — E041 Nontoxic single thyroid nodule: Secondary | ICD-10-CM

## 2020-12-12 LAB — POCT GLYCOSYLATED HEMOGLOBIN (HGB A1C): Hemoglobin A1C: 5.6 % (ref 4.0–5.6)

## 2020-12-12 MED ORDER — LEVOTHYROXINE SODIUM 100 MCG PO TABS: 100.0000 ug | ORAL_TABLET | Freq: Every day | ORAL | 3 refills | Status: DC

## 2020-12-12 NOTE — Progress Notes (Signed)
Patient ID: Kim Tanner, female   DOB: 1971/09/07, 50 y.o.   MRN: [id number]   This visit occurred during the SARS-CoV-2 public health emergency.  Safety protocols were in place, including screening questions prior to the visit, additional usage of staff PPE, and extensive cleaning of exam room while observing appropriate contact time as indicated for disinfecting solutions.   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  Last visit 3 months ago.  Interim history: Since last visit, she did not notice any changes in her sxs - persistent fatigue as she is not sleeping well at night. She will start a CPAP soon. She missed 4 doses of levothyroxine the last 4 days that she could not refill it from the pharmacy.  Reviewed history: Patient was diagnosed with thyrotoxicosis in 11/2016.  She was referred to endocrinology then, but could not come at that time, and only got to see me for the first time in 11/2018.  At last visit, her TFTs were still thyrotoxic and her TSI antibodies were elevated, confirming Graves' disease.  We started methimazole 5 mg twice a day, but she did not come back for labs 1.5 months later...  She had another set of TFTs by PCP in 05/2019 and these appeared worse.    She tells me that before her labs in 05/2019, she was missing 2nd dose of MMI at the day.  We contacted her to start taking the medication consistently.  At last visit she was taking 5 mg twice a day.  Since then, we checked a thyroid uptake and scan (07/28/2019) and this was positive for Graves' disease: 4 hour I-123 uptake = 39%% (normal 5-20%) 24 hour I-123 uptake = 60%% (normal 10-30%)  IMPRESSION: Elevated 4 hour and 24 hour radio iodine uptakes consistent with hyperthyroidism. No focal scintigraphic abnormalities on imaging. Findings  consistent with Graves disease.  She had RAI treatment (08/31/2019).  She felt well after RAI treatment, without complaints, however, she developed post ablation hypothyroidism in 12/2019.  We started levothyroxine 50 mcg daily and advised her to come back in 5 to 6 weeks for labs but she did not return. She saw PCP 7 months later and at that time TSH was still extremely high, at 74.8.  At last visit we increased the dose to 100 mcg daily.  She takes this: - missed the last 4 days - in am - fasting - Keeps it in the bathroom - coffee + cream + sugar 2h later - ~2h from b'fast - no calcium - no iron -On multivitamins at night - no PPIs - stopped B complex (30 mcg biotin)  Reviewed her TFTs: Lab Results  Component Value Date   TSH 74.800 (H) 08/24/2020   TSH 90.54 Repeated and verified X2. (H) 01/21/2020   TSH <0.005 (L) 11/11/2019   TSH <0.01 (L) 07/14/2019   TSH <0.005 (L) 05/11/2019   TSH <0.01 (L) 12/19/2018   TSH <0.006 (L) 11/18/2018   TSH <0.006 (L) 09/16/2018   TSH <0.006 (L) 02/25/2018   TSH 0.01 (L) 08/21/2017   FREET4 0.57 (L) 08/24/2020   FREET4 0.19 (L) 01/21/2020   FREET4 1.09 11/11/2019   FREET4 1.34 07/14/2019   FREET4 2.37 (H) 05/11/2019   FREET4 1.53 12/19/2018   FREET4 2.14 (H) 11/18/2018   FREET4 1.69 09/16/2018   FREET4 1.63 02/25/2018   FREET4 1.4 08/21/2017  T3FREE 2.0 (L) 01/21/2020   T3FREE 3.8 07/14/2019   T3FREE 5.3 (H) 12/19/2018   T3FREE 3.8 12/26/2016   Her TSI's were elevated confirming Graves' disease: Lab Results  Component Value Date   TSI 386 (H) 12/19/2018    Thyroid U/S (01/21/2017): Moderately heterogeneous thyroid of normal size, with a 1 cm nodule  Pt denies: - feeling nodules in neck - hoarseness - dysphagia - choking - SOB with lying down  No FH of thyroid cancer. No h/o radiation tx to head or neck except RAI treatment..  No seaweed or kelp. No recent contrast studies. No herbal supplements. No recent steroids  use.   She also has a history of anxiety.  She saw Dr. Adair Patter in the weight management clinic. She is seeing at the [facility]. She tried Phentermine >> felt poorly >> stopped.   She has a history of prediabetes and was started on Metformin by Dr. Leafy Ro 11/2018-now off.  Reviewed HbA1c levels: Lab Results  Component Value Date   HGBA1C 6.0 (H) 08/24/2020   HGBA1C 5.6 05/11/2019   HGBA1C 5.5 11/18/2018   She has a history of HTN-on HCTZ   She was recently diagnosed with hyperlipidemia and started on Crestor 10 >> Lipids improved: Lab Results  Component Value Date   CHOL 190 12/05/2020   HDL 50 12/05/2020   LDLCALC 128 (H) 12/05/2020   TRIG 65 12/05/2020   CHOLHDL 3.8 12/05/2020   ROS: Constitutional: no weight gain/no weight loss, + fatigue, no subjective hyperthermia, no subjective hypothermia Eyes: no blurry vision, no xerophthalmia ENT: no sore throat, + see HPI Cardiovascular: no CP/no SOB/no palpitations/no leg swelling Respiratory: no cough/no SOB/no wheezing Gastrointestinal: no N/no V/no D/no C/no acid reflux Musculoskeletal: no muscle aches/no joint aches Skin: no rashes, no hair loss Neurological: no tremors/no numbness/no tingling/no dizziness  I reviewed pt's medications, allergies, PMH, social hx, family hx, and changes were documented in the history of present illness. Otherwise, unchanged from my initial visit note.  Past Medical History:  Diagnosis Date  . Anxiety   . Asthma   . Back pain   . Dyspnea   . HTN (hypertension)   . Joint pain   . Lower extremity edema   . Metabolic syndrome   . Morbid obesity ([place])   . Prediabetes    Past Surgical History:  Procedure Laterality Date  . BREAST REDUCTION SURGERY  2008  . CESAREAN SECTION    . DILATION AND CURETTAGE OF UTERUS    . INDUCED ABORTION    . REDUCTION MAMMAPLASTY    . uterine ablation     Social History   Socioeconomic History  . Marital status: Single    Spouse name: Not on  file  . Number of children: 2  . Years of education: Not on file  . Highest education level: Not on file  Occupational History  . 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    Employer: [employer]  Tobacco Use  . Smoking status: Never Smoker  . Smokeless tobacco: Never Used  Vaping Use  . Vaping Use: Never used  Substance and Sexual Activity  . Alcohol use: Not Currently    Comment: socially   . Drug use: No  . Sexual activity: Not Currently    Birth control/protection: Condom  Other Topics Concern  . Not on file  Social History Narrative  . Not on file   Social Determinants of Health   Financial Resource Strain: Not on file  Food Insecurity: Not  on file  Transportation Needs: Not on file  Physical Activity: Not on file  Stress: Not on file  Social Connections: Not on file  Intimate Partner Violence: Not on file   Current Outpatient Medications on File Prior to Visit  Medication Sig Dispense Refill  . albuterol (PROVENTIL HFA;VENTOLIN HFA) 108 (90 Base) MCG/ACT inhaler Inhale 2 puffs into the lungs every 6 (six) hours as needed for wheezing or shortness of breath. 1 Inhaler 1  . Fluticasone-Salmeterol (ADVAIR DISKUS) 100-50 MCG/DOSE AEPB Inhale 1 puff into the lungs 2 (two) times daily. 60 each 1  . hydrochlorothiazide (HYDRODIURIL) 25 MG tablet TAKE 1 TABLET BY MOUTH DAILY 90 tablet 0  . levothyroxine (SYNTHROID) 100 MCG tablet Take 1 tablet (100 mcg total) by mouth daily. 45 tablet 3  . losartan (COZAAR) 50 MG tablet TAKE 1 TABLET BY MOUTH DAILY 90 tablet 0  . meclizine (ANTIVERT) 25 MG tablet TAKE 1 TABLET(25 MG) BY MOUTH TWICE DAILY AS NEEDED FOR DIZZINESS 30 tablet 0  . rosuvastatin (CRESTOR) 10 MG tablet Take 1 tablet (10 mg total) by mouth daily. 90 tablet 1   No current facility-administered medications on file prior to visit.   Allergies  Allergen Reactions  . Compazine [Prochlorperazine Edisylate]    Family History  Problem Relation Age of Onset  .  Hypertension Mother   . Obesity Mother   . Hypertension Father   . Diabetes Father   . Diabetes Sister   . Stroke Sister 75  . Breast cancer Neg Hx    PE: BP 120/78 (BP Location: Right Arm, Patient Position: Sitting, Cuff Size: Large)   Pulse 76   Ht 5\' 2"  (1.575 m)   Wt 264 lb 9.6 oz (120 kg)   SpO2 99%   BMI 48.40 kg/m  Wt Readings from Last 3 Encounters:  12/12/20 264 lb 9.6 oz (120 kg)  12/05/20 259 lb 9.6 oz (117.8 kg)  08/30/20 261 lb 6.4 oz (118.6 kg)   Constitutional: overweight, in NAD Eyes: PERRLA, EOMI, no exophthalmos ENT: moist mucous membranes, + very slight symmetric thyromegaly, no cervical lymphadenopathy Cardiovascular: RRR, No MRG Respiratory: CTA B Gastrointestinal: abdomen soft, NT, ND, BS+ Musculoskeletal: no deformities, strength intact in all 4 Skin: moist, warm, no rashes except acanthosis nigricans on neck, also skin tags on neck Neurological: no tremor with outstretched hands, DTR normal in all 4  ASSESSMENT: 1.  Graves' disease -Now status post RAI treatment  2.  Right thyroid nodule  3.  Elevated HbA1c  PLAN:  1. Patient with a long history of untreated thyrotoxicosis since at least 2018, without thyrotoxic symptoms.  Her Graves' antibodies were elevated in the past, pointing towards Graves' disease.  We tried methimazole but she was not very compliant with the medication on labs and her TFTs remained abnormal.  She finally had RAI treatment on 08/31/2019.  Her TSH was still suppressed initially after RAI treatment but did increase significantly to a value of 90 afterwards. -We started levothyroxine 50 mcg daily and I advised her to come back for labs to adjust the dose up.  She did not return for these. -She had labs with PCP November and the TSH was extremely high: Lab Results  Component Value Date   TSH 74.800 (H) 08/24/2020  -At last visit, we discussed about possible consequences of uncontrolled hypothyroidism to include weight gain,  fatigue, depression, hyperlipidemia and also bradycardia, heart block, pericarditis, hypertension.  She had hyperlipidemia and was recommended to start the statin and  I encouraged her to start it but discussed that uncontrolled hypothyroidism could have contributed to her hypercholesterolemia.  Indeed, latest cholesterol level was much improved. -At last visit we increase her levothyroxine dose to 100 mcg daily -She did not come back for labs after we increased the dose (she forgot) - pt feels good on this dose. - we discussed about taking the thyroid hormone every day, with water, >30 minutes before breakfast, separated by >4 hours from acid reflux medications, calcium, iron, multivitamins. Pt. is taking it correctly but was not able to take it in the last 4 days as she could not refill it at the pharmacy.  I sent a new prescription to her pharmacy and advised that it is not his right away. - will check thyroid tests in 5 weeks: TSH and fT4 - I we will see her back in 3-4 months.  2.  Right thyroid nodule -Measuring 1 cm and seen on the thyroid ultrasound from 2018 -She denies neck compression symptoms -This does not appear to be worrisome nodule and we can continue to follow her clinically  3.  Elevated HbA1c -Last HbA1c was 6.0% -At today's visit, we repeated that this was normal, at 5.6%  Orders Placed This Encounter  Procedures  . POCT glycosylated hemoglobin (Hb A1C)    Philemon Kingdom, MD PhD [facility] Endocrinology

## 2020-12-12 NOTE — Patient Instructions (Addendum)
Please continue levothyroxine to 100 mcg daily.  Take the thyroid hormone every day, with water, at least 30 minutes before breakfast, separated by at least 4 hours from: - acid reflux medications - calcium - iron - multivitamins  Please come back for labs in 5-6 weeks.  Please return for another visit in 3-4 months.

## 2021-01-09 ENCOUNTER — Ambulatory Visit: Payer: BC Managed Care – PPO | Admitting: Family Medicine

## 2021-01-13 ENCOUNTER — Other Ambulatory Visit: Payer: BC Managed Care – PPO

## 2021-01-19 ENCOUNTER — Other Ambulatory Visit: Payer: Self-pay

## 2021-01-19 ENCOUNTER — Encounter: Payer: Self-pay | Admitting: Family Medicine

## 2021-01-19 ENCOUNTER — Ambulatory Visit: Payer: BC Managed Care – PPO | Admitting: Family Medicine

## 2021-01-19 VITALS — BP 140/90 | HR 67 | Wt 264.2 lb

## 2021-01-19 DIAGNOSIS — G4733 Obstructive sleep apnea (adult) (pediatric): Secondary | ICD-10-CM | POA: Diagnosis not present

## 2021-01-19 DIAGNOSIS — R7303 Prediabetes: Secondary | ICD-10-CM | POA: Diagnosis not present

## 2021-01-19 DIAGNOSIS — I10 Essential (primary) hypertension: Secondary | ICD-10-CM

## 2021-01-19 NOTE — Progress Notes (Signed)
   Subjective:    Patient ID: Kim Tanner, female    DOB: June 13, 1971, 50 y.o.   MRN: [id number]  HPI Chief Complaint  Patient presents with  . Follow-up    Follow-up on Bp 152/92 is the highest. No other concerns   This is a 4 week follow up on HTN. At her previous visit her BP was elevated and she reported having a diet high in sodium and missing some of her medications for 3 weeks or so.  Since then she has been taking her medications daily including HCTZ 25 mg, losartan 50 mg and Crestor 10 mg.  Diet has been lower in sodium.  BP at home has been much better per patient.  States she has not been able to get a CPAP for her sleep apnea.  States she is taking a vitamin D supplement.   No new concerns. Declines labs today.  No fever, chills, dizziness, chest pain, palpitations, shortness of breath, LE edema.   Review of Systems Pertinent positives and negatives in the history of present illness.     Objective:   Physical Exam BP 140/90   Pulse 67   Wt 264 lb 3.2 oz (119.8 kg)   SpO2 99%   BMI 48.32 kg/m   Alert and oriented in no acute distress.  Respirations unlabored.  Normal speech and mood.      Assessment & Plan:  Essential hypertension  Prediabetes  OSA (obstructive sleep apnea)  Blood pressure slightly improved.  I recommend good compliance with medications and continuing a low-sodium diet.  Discussed increasing physical activity. Counseling on prediabetes and cutting back on sugar and carbohydrates.  Discussed that exercise can help lower blood sugars.  Declines labs today and would like to wait until her physical to recheck her A1c.  She is aware that she has some mild CKD and we discussed the importance of keeping her blood pressure under good control. Encouraged her to continue checking with the company who will be providing her CPAP machine and supplies.  Discussed treating her sleep apnea will help hypertension and her overall energy level.

## 2021-04-13 ENCOUNTER — Other Ambulatory Visit: Payer: Self-pay

## 2021-04-13 ENCOUNTER — Other Ambulatory Visit (INDEPENDENT_AMBULATORY_CARE_PROVIDER_SITE_OTHER): Payer: BC Managed Care – PPO

## 2021-04-13 ENCOUNTER — Other Ambulatory Visit: Payer: Self-pay | Admitting: Internal Medicine

## 2021-04-13 DIAGNOSIS — E890 Postprocedural hypothyroidism: Secondary | ICD-10-CM

## 2021-04-13 LAB — T4, FREE: Free T4: 0.92 ng/dL (ref 0.60–1.60)

## 2021-04-13 LAB — TSH: TSH: 22.27 u[IU]/mL — ABNORMAL HIGH (ref 0.35–5.50)

## 2021-04-13 MED ORDER — LEVOTHYROXINE SODIUM 125 MCG PO TABS: 125.0000 ug | ORAL_TABLET | Freq: Every day | ORAL | 3 refills | Status: DC

## 2021-04-14 ENCOUNTER — Other Ambulatory Visit: Payer: BC Managed Care – PPO

## 2021-04-19 ENCOUNTER — Encounter: Payer: Self-pay | Admitting: Internal Medicine

## 2021-04-21 ENCOUNTER — Other Ambulatory Visit: Payer: Self-pay

## 2021-04-21 ENCOUNTER — Ambulatory Visit: Payer: BC Managed Care – PPO | Admitting: Internal Medicine

## 2021-04-21 ENCOUNTER — Encounter: Payer: Self-pay | Admitting: Internal Medicine

## 2021-04-21 VITALS — BP 128/90 | HR 87 | Ht 62.0 in | Wt 273.0 lb

## 2021-04-21 DIAGNOSIS — E890 Postprocedural hypothyroidism: Secondary | ICD-10-CM

## 2021-04-21 DIAGNOSIS — R7309 Other abnormal glucose: Secondary | ICD-10-CM

## 2021-04-21 DIAGNOSIS — E041 Nontoxic single thyroid nodule: Secondary | ICD-10-CM | POA: Diagnosis not present

## 2021-04-21 NOTE — Patient Instructions (Signed)
Please continue levothyroxine 125 mcg daily.  Take the thyroid hormone every day, with water, at least 30 minutes before breakfast, separated by at least 4 hours from: - acid reflux medications - calcium - iron - multivitamins  Please come back for labs in 4 weeks.  Please return for another visit in ~5 months.

## 2021-04-21 NOTE — Progress Notes (Signed)
Patient ID: Kim Tanner, female   DOB: Jan 29, 1971, 50 y.o.   MRN: [id number]   This visit occurred during the SARS-CoV-2 public health emergency.  Safety protocols were in place, including screening questions prior to the visit, additional usage of staff PPE, and extensive cleaning of exam room while observing appropriate contact time as indicated for disinfecting solutions.   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  Last visit 3 months ago.  Interim history: She continues to not sleep very well at night.  She has persistent fatigue. She also feels bloated and gaining weight.  Reviewed history: Patient was diagnosed with thyrotoxicosis in 11/2016.  She was referred to endocrinology then, but could not come at that time, and only got to see me for the first time in 11/2018.  At last visit, her TFTs were still thyrotoxic and her TSI antibodies were elevated, confirming Graves' disease.  We started methimazole 5 mg twice a day, but she did not come back for labs 1.5 months later...  She had another set of TFTs by PCP in 05/2019 and these appeared worse.    She tells me that before her labs in 05/2019, she was missing 2nd dose of MMI at the day.  We contacted her to start taking the medication consistently.  At last visit she was taking 5 mg twice a day.  Since then, we checked a thyroid uptake and scan (07/28/2019) and this was positive for Graves' disease: 4 hour I-123 uptake = 39%% (normal 5-20%) 24 hour I-123 uptake = 60%% (normal 10-30%)   IMPRESSION: Elevated 4 hour and 24 hour radio iodine uptakes consistent with hyperthyroidism. No focal scintigraphic abnormalities on imaging. Findings consistent with Graves disease.  She had RAI treatment (08/31/2019).  She felt well after RAI treatment, without complaints, however,  she developed post ablation hypothyroidism in 12/2019.  We started levothyroxine 50 mcg daily and advised her to come back in 5 to 6 weeks for labs but she did not return. She saw PCP 7 months later and at that time TSH was still extremely high, at 74.8.  We subsequently increased the levothyroxine, last increased at last visit.  She is currently on 125 mcg daily taken: - in am (4:30 am)  - fasting - Keeps it in the bathroom - coffee + cream + sugar >2h later - .2h from b'fast - no calcium - no iron - + multivitamins at night - no PPIs - stopped B complex (30 mcg biotin)  Reviewed her TFTs: Lab Results  Component Value Date   TSH 22.27 (H) 04/13/2021   TSH 74.800 (H) 08/24/2020   TSH 90.54 Repeated and verified X2. (H) 01/21/2020   TSH <0.005 (Tanner) 11/11/2019   TSH <0.01 (Tanner) 07/14/2019   TSH <0.005 (Tanner) 05/11/2019   TSH <0.01 (Tanner) 12/19/2018   TSH <0.006 (Tanner) 11/18/2018   TSH <0.006 (Tanner) 09/16/2018   TSH <0.006 (Tanner) 02/25/2018   FREET4 0.92 04/13/2021   FREET4 0.57 (Tanner) 08/24/2020   FREET4 0.19 (Tanner) 01/21/2020   FREET4 1.09 11/11/2019   FREET4 1.34 07/14/2019   FREET4 2.37 (H) 05/11/2019   FREET4 1.53 12/19/2018   FREET4 2.14 (H) 11/18/2018   FREET4 1.69 09/16/2018   FREET4 1.63 02/25/2018   T3FREE 2.0 (Tanner) 01/21/2020   T3FREE 3.8 07/14/2019   T3FREE 5.3 (H) 12/19/2018   T3FREE 3.8 12/26/2016   Her  TSI's were elevated confirming Graves' disease: Lab Results  Component Value Date   TSI 386 (H) 12/19/2018    Thyroid U/S (01/21/2017): Moderately heterogeneous thyroid of normal size, with a 1 cm nodule  Pt denies: - feeling nodules in neck - hoarseness - dysphagia - choking - SOB with lying down  No FH of thyroid cancer. No h/o radiation tx to head or neck except RAI treatment..  No recent contrast studies. No herbal supplements. No recent steroids use.   She also has a history of anxiety.  She saw Dr. Adair Patter in the weight management clinic. She is seeing at the  [facility]. She tried Phentermine >> felt poorly >> stopped.   She has a history of prediabetes and was started on Metformin by Dr. Leafy Ro 11/2018-now off.  Reviewed HbA1c levels: Lab Results  Component Value Date   HGBA1C 5.6 12/12/2020   HGBA1C 6.0 (H) 08/24/2020   HGBA1C 5.6 05/11/2019   She has a history of HTN-on HCTZ   She also has hyperlipidemia and lipids improved after starting Crestor: Lab Results  Component Value Date   CHOL 190 12/05/2020   HDL 50 12/05/2020   LDLCALC 128 (H) 12/05/2020   TRIG 65 12/05/2020   CHOLHDL 3.8 12/05/2020   ROS: + See HPI  Past Medical History:  Diagnosis Date   Anxiety    Asthma    Back pain    Dyspnea    HTN (hypertension)    Joint pain    Lower extremity edema    Metabolic syndrome    Morbid obesity ([facility])    Prediabetes    Past Surgical History:  Procedure Laterality Date   BREAST REDUCTION SURGERY  2008   CESAREAN SECTION     DILATION AND CURETTAGE OF UTERUS     INDUCED ABORTION     REDUCTION MAMMAPLASTY     uterine ablation     Social History   Socioeconomic History   Marital status: Single    Spouse name: Not on file   Number of children: 2   Years of education: Not on file   Highest education level: Not on file  Occupational History   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    Employer: [employer]  Tobacco Use   Smoking status: Never   Smokeless tobacco: Never  Vaping Use   Vaping Use: Never used  Substance and Sexual Activity   Alcohol use: Not Currently    Comment: socially    Drug use: No   Sexual activity: Not Currently    Birth control/protection: Condom  Other Topics Concern   Not on file  Social History Narrative   Not on file   Social Determinants of Health   Financial Resource Strain: Not on file  Food Insecurity: Not on file  Transportation Needs: Not on file  Physical Activity: Not on file  Stress: Not on file  Social Connections: Not on file  Intimate Partner  Violence: Not on file   Current Outpatient Medications on File Prior to Visit  Medication Sig Dispense Refill   albuterol (PROVENTIL HFA;VENTOLIN HFA) 108 (90 Base) MCG/ACT inhaler Inhale 2 puffs into the lungs every 6 (six) hours as needed for wheezing or shortness of breath. 1 Inhaler 1   Fluticasone-Salmeterol (ADVAIR DISKUS) 100-50 MCG/DOSE AEPB Inhale 1 puff into the lungs 2 (two) times daily. 60 each 1   hydrochlorothiazide (HYDRODIURIL) 25 MG tablet TAKE 1 TABLET BY MOUTH DAILY 90 tablet 0   levothyroxine (SYNTHROID) 125 MCG tablet  Take 1 tablet (125 mcg total) by mouth daily. 45 tablet 3   losartan (COZAAR) 50 MG tablet TAKE 1 TABLET BY MOUTH DAILY 90 tablet 0   meclizine (ANTIVERT) 25 MG tablet TAKE 1 TABLET(25 MG) BY MOUTH TWICE DAILY AS NEEDED FOR DIZZINESS 30 tablet 0   rosuvastatin (CRESTOR) 10 MG tablet Take 1 tablet (10 mg total) by mouth daily. 90 tablet 1   No current facility-administered medications on file prior to visit.   Allergies  Allergen Reactions   Compazine [Prochlorperazine Edisylate]    Family History  Problem Relation Age of Onset   Hypertension Mother    Obesity Mother    Hypertension Father    Diabetes Father    Diabetes Sister    Stroke Sister 28   Breast cancer Neg Hx    PE: There were no vitals taken for this visit. Wt Readings from Last 3 Encounters:  01/19/21 264 lb 3.2 oz (119.8 kg)  12/12/20 264 lb 9.6 oz (120 kg)  12/05/20 259 lb 9.6 oz (117.8 kg)   Constitutional: overweight, in NAD Eyes: PERRLA, EOMI, no exophthalmos ENT: moist mucous membranes, + very slight symmetric thyromegaly, no cervical lymphadenopathy Cardiovascular: RRR, No MRG Respiratory: CTA B Gastrointestinal: abdomen soft, NT, ND, BS+ Musculoskeletal: no deformities, strength intact in all 4 Skin: moist, warm, no rashes except acanthosis nigricans on neck, also skin tags on neck Neurological: no tremor with outstretched hands, DTR normal in all 4  ASSESSMENT: 1.   Graves' disease -Now status post RAI treatment  2.  Right thyroid nodule  3.  Elevated HbA1c  PLAN:  1. Patient with a long history of untreated thyrotoxicosis since at least 2018, without thyrotoxic symptoms.  Her Graves' antibodies were elevated in the past, pointing towards Graves' disease.  We tried methimazole but she was not very compliant with the medication on labs and her TFTs remained abnormal.  She finally had RAI treatment on 08/31/2019.  TSH was suppressed initially after RAI treatment, but it did increase significantly afterwards, to 90.  We started levothyroxine 50 mcg daily but she did not return for repeat labs.  When we finally recheck them again, TSH was 74.8.  We increased the dose of levothyroxine afterwards, with the latest increase at last visit, from 100 to 125 mcg daily.  At that time, she was off her levothyroxine for 4 days as she did not receive it from the pharmacy.  We discussed about the absolute importance of not missing doses.  We also discussed at last visit about possible consequences of uncontrolled hypothyroidism to include weight gain, fatigue, depression, hyperlipidemia, hypertension, bradycardia, heart block, pericarditis. - latest thyroid labs reviewed with pt. >> TSH was improved, but still elevated Lab Results  Component Value Date   TSH 22.27 (H) 04/13/2021  - she continues on LT4 125 mcg daily - pt feels good on this dose. - we discussed about taking the thyroid hormone every day, with water, >30 minutes before breakfast, separated by >4 hours from acid reflux medications, calcium, iron, multivitamins. Pt. is taking it correctly. - will check thyroid tests in 4 weeks: TSH and fT4 - If labs are abnormal, she will need to return for repeat TFTs in 1.5 months - I we will see her back in 5 months  2.  Right thyroid nodule -Measuring 1 cm and seen on the thyroid ultrasound from 2018 -No neck compression symptoms -This does not appear to be worrisome and  we can continue to follow her clinically  3.  Elevated HbA1c -She previous had a higher HbA1c, at 6.0% -At last visit, however, HbA1c was normal, at 5.6%. -We will recheck it at next visit  Orders Placed This Encounter  Procedures   TSH   T4, free    Philemon Kingdom, MD PhD [facility] Endocrinology

## 2021-08-19 ENCOUNTER — Other Ambulatory Visit: Payer: Self-pay | Admitting: Family Medicine

## 2021-08-19 DIAGNOSIS — I10 Essential (primary) hypertension: Secondary | ICD-10-CM

## 2021-08-21 ENCOUNTER — Other Ambulatory Visit: Payer: Self-pay

## 2021-08-21 DIAGNOSIS — E7800 Pure hypercholesterolemia, unspecified: Secondary | ICD-10-CM

## 2021-08-21 MED ORDER — LOSARTAN POTASSIUM 50 MG PO TABS: 50.0000 mg | ORAL_TABLET | Freq: Every day | ORAL | 0 refills | Status: DC

## 2021-08-21 MED ORDER — ROSUVASTATIN CALCIUM 10 MG PO TABS: 10.0000 mg | ORAL_TABLET | Freq: Every day | ORAL | 0 refills | Status: DC

## 2021-08-21 MED ORDER — HYDROCHLOROTHIAZIDE 25 MG PO TABS: 25.0000 mg | ORAL_TABLET | Freq: Every day | ORAL | 0 refills | Status: DC

## 2021-09-18 ENCOUNTER — Encounter: Payer: BC Managed Care – PPO | Admitting: Family Medicine

## 2021-10-03 ENCOUNTER — Ambulatory Visit: Payer: BC Managed Care – PPO | Admitting: Internal Medicine

## 2021-11-07 ENCOUNTER — Other Ambulatory Visit: Payer: Self-pay

## 2021-11-07 ENCOUNTER — Encounter: Payer: Self-pay | Admitting: Physician Assistant

## 2021-11-07 ENCOUNTER — Ambulatory Visit: Payer: BC Managed Care – PPO | Admitting: Physician Assistant

## 2021-11-07 VITALS — BP 138/88 | HR 73 | Ht 61.5 in | Wt 266.6 lb

## 2021-11-07 DIAGNOSIS — E041 Nontoxic single thyroid nodule: Secondary | ICD-10-CM

## 2021-11-07 DIAGNOSIS — Z0000 Encounter for general adult medical examination without abnormal findings: Secondary | ICD-10-CM

## 2021-11-07 DIAGNOSIS — R7303 Prediabetes: Secondary | ICD-10-CM

## 2021-11-07 DIAGNOSIS — H8110 Benign paroxysmal vertigo, unspecified ear: Secondary | ICD-10-CM

## 2021-11-07 DIAGNOSIS — I10 Essential (primary) hypertension: Secondary | ICD-10-CM | POA: Diagnosis not present

## 2021-11-07 DIAGNOSIS — E559 Vitamin D deficiency, unspecified: Secondary | ICD-10-CM | POA: Diagnosis not present

## 2021-11-07 DIAGNOSIS — R7989 Other specified abnormal findings of blood chemistry: Secondary | ICD-10-CM

## 2021-11-07 DIAGNOSIS — E0500 Thyrotoxicosis with diffuse goiter without thyrotoxic crisis or storm: Secondary | ICD-10-CM

## 2021-11-07 DIAGNOSIS — R140 Abdominal distension (gaseous): Secondary | ICD-10-CM

## 2021-11-07 DIAGNOSIS — E7800 Pure hypercholesterolemia, unspecified: Secondary | ICD-10-CM

## 2021-11-07 DIAGNOSIS — Z1211 Encounter for screening for malignant neoplasm of colon: Secondary | ICD-10-CM

## 2021-11-07 DIAGNOSIS — G4733 Obstructive sleep apnea (adult) (pediatric): Secondary | ICD-10-CM

## 2021-11-07 MED ORDER — MECLIZINE HCL 25 MG PO TABS: ORAL_TABLET | ORAL | 1 refills | Status: AC

## 2021-11-07 MED ORDER — ROSUVASTATIN CALCIUM 10 MG PO TABS: 10.0000 mg | ORAL_TABLET | Freq: Every day | ORAL | 1 refills | Status: DC

## 2021-11-07 MED ORDER — HYDROCHLOROTHIAZIDE 25 MG PO TABS: 25.0000 mg | ORAL_TABLET | Freq: Every day | ORAL | 1 refills | Status: DC

## 2021-11-07 MED ORDER — LOSARTAN POTASSIUM 50 MG PO TABS: 50.0000 mg | ORAL_TABLET | Freq: Every day | ORAL | 1 refills | Status: DC

## 2021-11-07 NOTE — Progress Notes (Signed)
Acute Office Visit  Subjective:    Patient ID: Kim Tanner, female    DOB: 1971/08/14, 51 y.o.   MRN: [id number]  Chief Complaint  Patient presents with   Annual Exam    Requests referral to [facility] for pap smears   Bloated    States she feels more bloated than usual for a couple months without changes in bowel movements. She asks about what colon cancer screenings are appropriate for her age   Apnea    Requests to have new supplies ordered for her CPAP machine; had a sleep study that recommended it.    HPI Patient is in today for an annual exam. Requests to go to [facility] for her women's wellness exam. Reports that she has a lot of bloating and requests to get a colonoscopy. Also requests to have her CPAP supplies ordered again; states she had a sleep study but hasn't received her CPAP yet.   Past Medical History:  Diagnosis Date   Anxiety    Asthma    Back pain    Dyspnea    HTN (hypertension)    Joint pain    Lower extremity edema    Metabolic syndrome    Morbid obesity ([place])    Prediabetes     Past Surgical History:  Procedure Laterality Date   BREAST REDUCTION SURGERY  2008   CESAREAN SECTION     DILATION AND CURETTAGE OF UTERUS     INDUCED ABORTION     REDUCTION MAMMAPLASTY     uterine ablation      Family History  Problem Relation Age of Onset   Hypertension Mother    Obesity Mother    Hypertension Father    Diabetes Father    Diabetes Sister    Stroke Sister 96   Breast cancer Neg Hx     Social History   Socioeconomic History   Marital status: Single    Spouse name: Not on file   Number of children: 2   Years of education: Not on file   Highest education level: Not on file  Occupational History   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    Employer: [employer]  Tobacco Use   Smoking status: Never   Smokeless tobacco: Never  Vaping Use   Vaping Use: Never used  Substance and Sexual Activity   Alcohol use: Not  Currently    Comment: socially    Drug use: No   Sexual activity: Not Currently    Birth control/protection: Condom  Other Topics Concern   Not on file  Social History Narrative   Not on file   Social Determinants of Health   Financial Resource Strain: Not on file  Food Insecurity: Not on file  Transportation Needs: Not on file  Physical Activity: Not on file  Stress: Not on file  Social Connections: Not on file  Intimate Partner Violence: Not on file    Outpatient Medications Prior to Visit  Medication Sig Dispense Refill   albuterol (PROVENTIL HFA;VENTOLIN HFA) 108 (90 Base) MCG/ACT inhaler Inhale 2 puffs into the lungs every 6 (six) hours as needed for wheezing or shortness of breath. 1 Inhaler 1   levothyroxine (SYNTHROID) 125 MCG tablet Take 1 tablet (125 mcg total) by mouth daily. 45 tablet 3   hydrochlorothiazide (HYDRODIURIL) 25 MG tablet Take 1 tablet (25 mg total) by mouth daily. 90 tablet 0   losartan (COZAAR) 50 MG tablet Take 1 tablet (50 mg total) by mouth  daily. 90 tablet 0   meclizine (ANTIVERT) 25 MG tablet TAKE 1 TABLET(25 MG) BY MOUTH TWICE DAILY AS NEEDED FOR DIZZINESS 30 tablet 0   rosuvastatin (CRESTOR) 10 MG tablet Take 1 tablet (10 mg total) by mouth daily. 90 tablet 0   metroNIDAZOLE (FLAGYL) 500 MG tablet SMARTSIG:1 Tablet(s) By Mouth Every 12 Hours     Fluticasone-Salmeterol (ADVAIR DISKUS) 100-50 MCG/DOSE AEPB Inhale 1 puff into the lungs 2 (two) times daily. (Patient not taking: Reported on 11/07/2021) 60 each 1   No facility-administered medications prior to visit.    Allergies  Allergen Reactions   Compazine [Prochlorperazine Edisylate]     Review of Systems  Constitutional:  Negative for activity change and chills.  HENT:  Negative for congestion and voice change.   Eyes:  Negative for pain and redness.  Respiratory:  Negative for cough and wheezing.   Cardiovascular:  Negative for chest pain.  Gastrointestinal:  Negative for constipation,  diarrhea, nausea and vomiting.  Endocrine: Negative for polyuria.  Genitourinary:  Negative for frequency.  Skin:  Negative for color change and rash.  Allergic/Immunologic: Negative for immunocompromised state.  Neurological:  Negative for dizziness.  Psychiatric/Behavioral:  Negative for agitation.       Objective:    Physical Exam Constitutional:      General: She is not in acute distress.    Appearance: Normal appearance. She is not ill-appearing.  HENT:     Head: Normocephalic and atraumatic.     Right Ear: External ear normal.     Left Ear: External ear normal.  Eyes:     Extraocular Movements: Extraocular movements intact.     Conjunctiva/sclera: Conjunctivae normal.     Pupils: Pupils are equal, round, and reactive to light.  Cardiovascular:     Rate and Rhythm: Normal rate and regular rhythm.     Pulses: Normal pulses.     Heart sounds: Normal heart sounds.  Pulmonary:     Effort: Pulmonary effort is normal.     Breath sounds: Normal breath sounds. No wheezing.  Abdominal:     General: Bowel sounds are normal.     Palpations: Abdomen is soft.  Musculoskeletal:     Cervical back: Normal range of motion and neck supple.     Right lower leg: No edema.     Left lower leg: No edema.  Skin:    General: Skin is warm and dry.     Findings: No bruising.  Neurological:     General: No focal deficit present.     Mental Status: She is alert and oriented to person, place, and time.  Psychiatric:        Mood and Affect: Mood normal.        Behavior: Behavior normal.        Thought Content: Thought content normal.    BP 138/88 (BP Location: Right Arm, Patient Position: Sitting)    Pulse 73    Ht 5' 1.5" (1.562 m)    Wt 266 lb 9.6 oz (120.9 kg)    SpO2 99%    BMI 49.56 kg/m  Wt Readings from Last 3 Encounters:  11/07/21 266 lb 9.6 oz (120.9 kg)  04/21/21 273 lb (123.8 kg)  01/19/21 264 lb 3.2 oz (119.8 kg)    Health Maintenance Due  Topic Date Due   COLONOSCOPY  (Pts 45-75yr Insurance coverage will need to be confirmed)  Never done    There are no preventive care reminders to display for this  patient.   Lab Results  Component Value Date   TSH 4.650 (H) 11/07/2021   Lab Results  Component Value Date   WBC 6.9 11/07/2021   HGB 11.5 11/07/2021   HCT 33.9 (L) 11/07/2021   MCV 87 11/07/2021   PLT 259 11/07/2021   Lab Results  Component Value Date   NA 141 11/07/2021   K 3.7 11/07/2021   CO2 26 11/07/2021   GLUCOSE 128 (H) 11/07/2021   BUN 13 11/07/2021   CREATININE 0.97 11/07/2021   BILITOT 0.2 11/07/2021   ALKPHOS 100 11/07/2021   AST 15 11/07/2021   ALT 17 11/07/2021   PROT 6.9 11/07/2021   ALBUMIN 4.1 11/07/2021   CALCIUM 9.5 11/07/2021   EGFR 72 11/07/2021   Lab Results  Component Value Date   CHOL 160 11/07/2021   Lab Results  Component Value Date   HDL 44 11/07/2021   Lab Results  Component Value Date   LDLCALC 103 (H) 11/07/2021   Lab Results  Component Value Date   TRIG 63 11/07/2021   Lab Results  Component Value Date   CHOLHDL 3.6 11/07/2021   Lab Results  Component Value Date   HGBA1C 6.1 (H) 11/07/2021       Assessment & Plan:   Problem List Items Addressed This Visit       Cardiovascular and Mediastinum   Essential hypertension   Relevant Medications   hydrochlorothiazide (HYDRODIURIL) 25 MG tablet   losartan (COZAAR) 50 MG tablet   rosuvastatin (CRESTOR) 10 MG tablet   Uncontrolled hypertension   Relevant Medications   hydrochlorothiazide (HYDRODIURIL) 25 MG tablet   losartan (COZAAR) 50 MG tablet   rosuvastatin (CRESTOR) 10 MG tablet     Respiratory   OSA (obstructive sleep apnea)     Endocrine   Graves disease   Relevant Orders   Thyroid Panel With TSH (Completed)   Right thyroid nodule   Relevant Orders   Thyroid Panel With TSH (Completed)     Other   Prediabetes   Relevant Orders   Comprehensive metabolic panel (Completed)   Hemoglobin A1c (Completed)   Vitamin D  deficiency   Relevant Orders   VITAMIN D 25 Hydroxy (Vit-D Deficiency, Fractures) (Completed)   Elevated serum creatinine   Other Visit Diagnoses     Routine medical exam    -  Primary   Relevant Orders   CBC with Differential/Platelet (Completed)   Comprehensive metabolic panel (Completed)   VITAMIN D 25 Hydroxy (Vit-D Deficiency, Fractures) (Completed)   Lipid panel (Completed)   Hemoglobin A1c (Completed)   Abdominal bloating       Relevant Orders   Ambulatory referral to Obstetrics / Gynecology   Ambulatory referral to Gastroenterology   Screening for colon cancer       Relevant Orders   Ambulatory referral to Gastroenterology   Hypercholesterolemia with LDL greater than 190 mg/dL       Relevant Medications   hydrochlorothiazide (HYDRODIURIL) 25 MG tablet   losartan (COZAAR) 50 MG tablet   rosuvastatin (CRESTOR) 10 MG tablet   Benign paroxysmal positional vertigo, unspecified laterality       Relevant Medications   meclizine (ANTIVERT) 25 MG tablet        Meds ordered this encounter  Medications   hydrochlorothiazide (HYDRODIURIL) 25 MG tablet    Sig: Take 1 tablet (25 mg total) by mouth daily.    Dispense:  90 tablet    Refill:  1    Order Specific Question:  Supervising Provider    Answer:   Denita Lung [[id number]]   losartan (COZAAR) 50 MG tablet    Sig: Take 1 tablet (50 mg total) by mouth daily.    Dispense:  90 tablet    Refill:  1    Order Specific Question:   Supervising Provider    Answer:   Denita Lung [[id number]]   rosuvastatin (CRESTOR) 10 MG tablet    Sig: Take 1 tablet (10 mg total) by mouth daily.    Dispense:  90 tablet    Refill:  1    Order Specific Question:   Supervising Provider    Answer:   Denita Lung [[id number]]   meclizine (ANTIVERT) 25 MG tablet    Sig: TAKE 1 TABLET(25 MG) BY MOUTH TWICE DAILY AS NEEDED FOR DIZZINESS    Dispense:  30 tablet    Refill:  1    Order Specific Question:   Supervising Provider    Answer:   Denita Lung [[id number]]   Return in 2 weeks for a follow up appointment.   Irene Pap, PA-C

## 2021-11-08 LAB — THYROID PANEL WITH TSH
Free Thyroxine Index: 2.8 (ref 1.2–4.9)
T3 Uptake Ratio: 28 % (ref 24–39)
T4, Total: 10 ug/dL (ref 4.5–12.0)
TSH: 4.65 u[IU]/mL — ABNORMAL HIGH (ref 0.450–4.500)

## 2021-11-08 LAB — CBC WITH DIFFERENTIAL/PLATELET
Basophils Absolute: 0 10*3/uL (ref 0.0–0.2)
Basos: 1 %
EOS (ABSOLUTE): 0.1 10*3/uL (ref 0.0–0.4)
Eos: 2 %
Hematocrit: 33.9 % — ABNORMAL LOW (ref 34.0–46.6)
Hemoglobin: 11.5 g/dL (ref 11.1–15.9)
Immature Grans (Abs): 0 10*3/uL (ref 0.0–0.1)
Immature Granulocytes: 0 %
Lymphocytes Absolute: 2 10*3/uL (ref 0.7–3.1)
Lymphs: 29 %
MCH: 29.4 pg (ref 26.6–33.0)
MCHC: 33.9 g/dL (ref 31.5–35.7)
MCV: 87 fL (ref 79–97)
Monocytes Absolute: 0.4 10*3/uL (ref 0.1–0.9)
Monocytes: 5 %
Neutrophils Absolute: 4.4 10*3/uL (ref 1.4–7.0)
Neutrophils: 63 %
Platelets: 259 10*3/uL (ref 150–450)
RBC: 3.91 x10E6/uL (ref 3.77–5.28)
RDW: 15 % (ref 11.7–15.4)
WBC: 6.9 10*3/uL (ref 3.4–10.8)

## 2021-11-08 LAB — COMPREHENSIVE METABOLIC PANEL
ALT: 17 IU/L (ref 0–32)
AST: 15 IU/L (ref 0–40)
Albumin/Globulin Ratio: 1.5 (ref 1.2–2.2)
Albumin: 4.1 g/dL (ref 3.8–4.8)
Alkaline Phosphatase: 100 IU/L (ref 44–121)
BUN/Creatinine Ratio: 13 (ref 9–23)
BUN: 13 mg/dL (ref 6–24)
Bilirubin Total: 0.2 mg/dL (ref 0.0–1.2)
CO2: 26 mmol/L (ref 20–29)
Calcium: 9.5 mg/dL (ref 8.7–10.2)
Chloride: 101 mmol/L (ref 96–106)
Creatinine, Ser: 0.97 mg/dL (ref 0.57–1.00)
Globulin, Total: 2.8 g/dL (ref 1.5–4.5)
Glucose: 128 mg/dL — ABNORMAL HIGH (ref 70–99)
Potassium: 3.7 mmol/L (ref 3.5–5.2)
Sodium: 141 mmol/L (ref 134–144)
Total Protein: 6.9 g/dL (ref 6.0–8.5)
eGFR: 72 mL/min/{1.73_m2} (ref >59)

## 2021-11-08 LAB — LIPID PANEL
Chol/HDL Ratio: 3.6 ratio (ref 0.0–4.4)
Cholesterol, Total: 160 mg/dL (ref 100–199)
HDL: 44 mg/dL (ref >39)
LDL Chol Calc (NIH): 103 mg/dL — ABNORMAL HIGH (ref 0–99)
Triglycerides: 63 mg/dL (ref 0–149)
VLDL Cholesterol Cal: 13 mg/dL (ref 5–40)

## 2021-11-08 LAB — VITAMIN D 25 HYDROXY (VIT D DEFICIENCY, FRACTURES): Vit D, 25-Hydroxy: 23.5 ng/mL — ABNORMAL LOW (ref 30.0–100.0)

## 2021-11-08 LAB — HEMOGLOBIN A1C
Est. average glucose Bld gHb Est-mCnc: 128 mg/dL
Hgb A1c MFr Bld: 6.1 % — ABNORMAL HIGH (ref 4.8–5.6)

## 2021-11-10 ENCOUNTER — Encounter: Payer: Self-pay | Admitting: Physician Assistant

## 2021-11-10 ENCOUNTER — Other Ambulatory Visit: Payer: Self-pay

## 2021-11-10 DIAGNOSIS — G4733 Obstructive sleep apnea (adult) (pediatric): Secondary | ICD-10-CM

## 2021-11-10 MED ORDER — VITAMIN D (ERGOCALCIFEROL) 1.25 MG (50000 UNIT) PO CAPS
50000.0000 [IU] | ORAL_CAPSULE | ORAL | 1 refills | Status: DC
Start: 2021-11-10 — End: 2022-05-14

## 2021-11-14 ENCOUNTER — Encounter: Payer: Self-pay | Admitting: Gastroenterology

## 2021-11-27 ENCOUNTER — Ambulatory Visit (AMBULATORY_SURGERY_CENTER): Payer: BC Managed Care – PPO | Admitting: *Deleted

## 2021-11-27 ENCOUNTER — Other Ambulatory Visit: Payer: Self-pay

## 2021-11-27 VITALS — Ht 61.5 in | Wt 266.0 lb

## 2021-11-27 DIAGNOSIS — Z1211 Encounter for screening for malignant neoplasm of colon: Secondary | ICD-10-CM

## 2021-11-27 MED ORDER — PEG 3350-KCL-NA BICARB-NACL 420 G PO SOLR: 4000.0000 mL | Freq: Once | ORAL | 0 refills | Status: AC

## 2021-11-27 NOTE — Progress Notes (Signed)

## 2021-11-30 ENCOUNTER — Encounter: Payer: Self-pay | Admitting: Gastroenterology

## 2021-12-08 ENCOUNTER — Encounter: Payer: Self-pay | Admitting: Gastroenterology

## 2021-12-08 ENCOUNTER — Ambulatory Visit (AMBULATORY_SURGERY_CENTER): Payer: BC Managed Care – PPO | Admitting: Gastroenterology

## 2021-12-08 VITALS — BP 134/84 | HR 62 | Temp 98.0°F | Resp 14 | Ht 61.5 in | Wt 266.0 lb

## 2021-12-08 DIAGNOSIS — Z1211 Encounter for screening for malignant neoplasm of colon: Secondary | ICD-10-CM

## 2021-12-08 MED ORDER — SODIUM CHLORIDE 0.9 % IV SOLN: 500.0000 mL | Freq: Once | INTRAVENOUS | Status: DC

## 2021-12-08 NOTE — Progress Notes (Signed)
Pt's states no medical or surgical changes since previsit or office visit. VS assessed by D.T 

## 2021-12-08 NOTE — Patient Instructions (Signed)
Discharge instructions given. ?Normal exam. ?Resume previous medications. ?YOU HAD AN ENDOSCOPIC PROCEDURE TODAY AT [facility]:   Refer to the procedure report that was given to you for any specific questions about what was found during the examination.  If the procedure report does not answer your questions, please call your gastroenterologist to clarify.  If you requested that your care partner not be given the details of your procedure findings, then the procedure report has been included in a sealed envelope for you to review at your convenience later. ? ?YOU SHOULD EXPECT: Some feelings of bloating in the abdomen. Passage of more gas than usual.  Walking can help get rid of the air that was put into your GI tract during the procedure and reduce the bloating. If you had a lower endoscopy (such as a colonoscopy or flexible sigmoidoscopy) you may notice spotting of blood in your stool or on the toilet paper. If you underwent a bowel prep for your procedure, you may not have a normal bowel movement for a few days. ? ?Please Note:  You might notice some irritation and congestion in your nose or some drainage.  This is from the oxygen used during your procedure.  There is no need for concern and it should clear up in a day or so. ? ?SYMPTOMS TO REPORT IMMEDIATELY: ? ?Following lower endoscopy (colonoscopy or flexible sigmoidoscopy): ? Excessive amounts of blood in the stool ? Significant tenderness or worsening of abdominal pains ? Swelling of the abdomen that is new, acute ? Fever of 100?F or higher ? ? ?For urgent or emergent issues, a gastroenterologist can be reached at any hour by calling [phone]. ?Do not use MyChart messaging for urgent concerns.  ? ? ?DIET:  We do recommend a small meal at first, but then you may proceed to your regular diet.  Drink plenty of fluids but you should avoid alcoholic beverages for 24 hours. ? ?ACTIVITY:  You should plan to take it easy for the rest of  today and you should NOT DRIVE or use heavy machinery until tomorrow (because of the sedation medicines used during the test).   ? ?FOLLOW UP: ?Our staff will call the number listed on your records 48-72 hours following your procedure to check on you and address any questions or concerns that you may have regarding the information given to you following your procedure. If we do not reach you, we will leave a message.  We will attempt to reach you two times.  During this call, we will ask if you have developed any symptoms of COVID 19. If you develop any symptoms (ie: fever, flu-like symptoms, shortness of breath, cough etc.) before then, please call [phone].  If you test positive for Covid 19 in the 2 weeks post procedure, please call and report this information to [name].   ? ?If any biopsies were taken you will be contacted by phone or by letter within the next 1-3 weeks.  Please call us at [phone] if you have not heard about the biopsies in 3 weeks.  ? ? ?SIGNATURES/CONFIDENTIALITY: ?You and/or your care partner have signed paperwork which will be entered into your electronic medical record.  These signatures attest to the fact that that the information above on your After Visit Summary has been reviewed and is understood.  Full responsibility of the confidentiality of this discharge information lies with you and/or your care-partner.  ?

## 2021-12-08 NOTE — Progress Notes (Signed)
Sedate, gd SR, tolerated procedure well, VSS, report to RN 

## 2021-12-08 NOTE — Progress Notes (Signed)
[facility] History and Physical ? ? ?Primary Care Physician:  Irene Pap, PA-C ? ? ?Reason for Procedure:   Colon cancer screening ? ?Plan:    Screening colonoscopy ? ? ? ? ?HPI: Kim Tanner is a 51 y.o. female undergoing initial average risk screening colonoscopy.  She has no family history of colon cancer and no chronic GI symptoms.   She had a colonoscopy 11 years ago because of some abdominal pain, but this was normal per patient. ? ? ?Past Medical History:  ?Diagnosis Date  ? Anxiety   ? Asthma   ? Back pain   ? Dyspnea   ? HTN (hypertension)   ? Joint pain   ? Lower extremity edema   ? Metabolic syndrome   ? Morbid obesity ([place])   ? Prediabetes   ? Sleep apnea   ? Uncontrolled hypertension 09/16/2018  ? ? ?Past Surgical History:  ?Procedure Laterality Date  ? BREAST REDUCTION SURGERY  2008  ? CESAREAN SECTION    ? [place]ONOS[place]    ? in [place]-normal exam about 9 years ago-no records per pt  ? DILATION AND CURETTAGE OF UTERUS    ? INDUCED ABORTION    ? REDUCTION MAMMAPLASTY    ? uterine ablation    ? ? ?Prior to Admission medications   ?Medication Sig Start Date End Date Taking? Authorizing Provider  ?hydrochlorothiazide (HYDRODIURIL) 25 MG tablet Take 1 tablet (25 mg total) by mouth daily. 11/07/21  Yes Irene Pap, PA-C  ?levothyroxine (SYNTHROID) 125 MCG tablet Take 1 tablet (125 mcg total) by mouth daily. 04/13/21  Yes Philemon Kingdom, MD  ?losartan (COZAAR) 50 MG tablet Take 1 tablet (50 mg total) by mouth daily. 11/07/21  Yes Francis Gaines B, PA-C  ?rosuvastatin (CRESTOR) 10 MG tablet Take 1 tablet (10 mg total) by mouth daily. 11/07/21  Yes Irene Pap, PA-C  ?Vitamin D, Ergocalciferol, (DRISDOL) 1.25 MG (50000 UNIT) CAPS capsule Take 1 capsule (50,000 Units total) by mouth every 7 (seven) days. 11/10/21  Yes Francis Gaines B, PA-C  ?albuterol (PROVENTIL HFA;VENTOLIN HFA) 108 (90 Base) MCG/ACT inhaler Inhale 2 puffs into the lungs every 6 (six) hours as needed for wheezing or  shortness of breath. 03/13/18   Henson, Vickie L, PA-C  ?meclizine (ANTIVERT) 25 MG tablet TAKE 1 TABLET(25 MG) BY MOUTH TWICE DAILY AS NEEDED FOR DIZZINESS 11/07/21   Irene Pap, PA-C  ? ? ?Current Outpatient Medications  ?Medication Sig Dispense Refill  ? hydrochlorothiazide (HYDRODIURIL) 25 MG tablet Take 1 tablet (25 mg total) by mouth daily. 90 tablet 1  ? levothyroxine (SYNTHROID) 125 MCG tablet Take 1 tablet (125 mcg total) by mouth daily. 45 tablet 3  ? losartan (COZAAR) 50 MG tablet Take 1 tablet (50 mg total) by mouth daily. 90 tablet 1  ? rosuvastatin (CRESTOR) 10 MG tablet Take 1 tablet (10 mg total) by mouth daily. 90 tablet 1  ? Vitamin D, Ergocalciferol, (DRISDOL) 1.25 MG (50000 UNIT) CAPS capsule Take 1 capsule (50,000 Units total) by mouth every 7 (seven) days. 12 capsule 1  ? albuterol (PROVENTIL HFA;VENTOLIN HFA) 108 (90 Base) MCG/ACT inhaler Inhale 2 puffs into the lungs every 6 (six) hours as needed for wheezing or shortness of breath. 1 Inhaler 1  ? meclizine (ANTIVERT) 25 MG tablet TAKE 1 TABLET(25 MG) BY MOUTH TWICE DAILY AS NEEDED FOR DIZZINESS 30 tablet 1  ? ?Current Facility-Administered Medications  ?Medication Dose Route Frequency Provider Last Rate Last Admin  ? 0.9 %  sodium chloride infusion  500 mL Intravenous Once Daryel November, MD      ? ? ?Allergies as of 12/08/2021 - Review Complete 12/08/2021  ?Allergen Reaction Noted  ? Compazine [prochlorperazine edisylate]  12/26/2016  ? ? ?Family History  ?Problem Relation Age of Onset  ? Hypertension Mother   ? Obesity Mother   ? Hypertension Father   ? Diabetes Father   ? Diabetes Sister   ? Stroke Sister 14  ? Breast cancer Neg Hx   ? Colon cancer Neg Hx   ? Rectal cancer Neg Hx   ? Stomach cancer Neg Hx   ? Esophageal cancer Neg Hx   ? Colon polyps Neg Hx   ? ? ?Social History  ? ?Socioeconomic History  ? Marital status: Single  ?  Spouse name: Not on file  ? Number of children: 2  ? Years of education: Not on file  ? Highest  education level: Not on file  ?Occupational History  ? 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  ?  Employer: [employer]  ?Tobacco Use  ? Smoking status: Never  ? Smokeless tobacco: Never  ?Vaping Use  ? Vaping Use: Never used  ?Substance and Sexual Activity  ? Alcohol use: Yes  ?  Alcohol/week: 1.0 - 2.0 standard drink  ?  Types: 1 - 2 Glasses of wine per week  ?  Comment: socially   ? Drug use: No  ? Sexual activity: Not Currently  ?  Birth control/protection: Condom  ?Other Topics Concern  ? Not on file  ?Social History Narrative  ? Not on file  ? ?Social Determinants of Health  ? ?Financial Resource Strain: Not on file  ?Food Insecurity: Not on file  ?Transportation Needs: Not on file  ?Physical Activity: Not on file  ?Stress: Not on file  ?Social Connections: Not on file  ?Intimate Partner Violence: Not on file  ? ? ?Review of Systems: ? ?All other review of systems negative except as mentioned in the HPI. ? ?Physical Exam: ?Vital signs ?BP (!) 177/88   Pulse 61   Temp 98 ?F (36.7 ?C) (Skin)   Ht 5' 1.5" (1.562 m)   Wt 266 lb (120.7 kg)   SpO2 100%   BMI 49.45 kg/m?  ? ?General:   Alert,  Well-developed, well-nourished, pleasant and cooperative in NAD ?Airway:  Mallampati 2 ?Lungs:  Clear throughout to auscultation.   ?Heart:  Regular rate and rhythm; no murmurs, clicks, rubs,  or gallops. ?Abdomen:  Soft, nontender and nondistended. Normal bowel sounds.   ?Neuro/Psych:  Normal mood and affect. A and O x 3 ? ? ?Jayjay Littles E. Candis Schatz, MD ?[facility] ? ?

## 2021-12-08 NOTE — Op Note (Signed)
[place] ?Patient Name: Kim Tanner ?Procedure Date: 12/08/2021 2:26 PM ?MRN: [id number] ?Endoscopist: Dupree Givler E. Candis Schatz , MD ?Age: 51 ?Referring MD:  ?Date of Birth: 07-Mar-1971 ?Gender: Female ?Account #: [id number] ?Procedure:                Colonoscopy ?Indications:              Screening for colorectal malignant neoplasm (last  ?                          colonoscopy was more than 10 years ago) ?Medicines:                Monitored Anesthesia Care ?Procedure:                Pre-Anesthesia Assessment: ?                          - Prior to the procedure, a History and Physical  ?                          was performed, and patient medications and  ?                          allergies were reviewed. The patient's tolerance of  ?                          previous anesthesia was also reviewed. The risks  ?                          and benefits of the procedure and the sedation  ?                          options and risks were discussed with the patient.  ?                          All questions were answered, and informed consent  ?                          was obtained. Prior Anticoagulants: The patient has  ?                          taken no previous anticoagulant or antiplatelet  ?                          agents. ASA Grade Assessment: III - A patient with  ?                          severe systemic disease. After reviewing the risks  ?                          and benefits, the patient was deemed in  ?                          satisfactory condition to undergo the procedure. ?  After obtaining informed consent, the colonoscope  ?                          was passed under direct vision. Throughout the  ?                          procedure, the patient's blood pressure, pulse, and  ?                          oxygen saturations were monitored continuously. The  ?                          Olympus CF-HQ190L (Serial# [id number]) Colonoscope was  ?                           introduced through the anus and advanced to the the  ?                          cecum, identified by appendiceal orifice and  ?                          ileocecal valve. The colonoscopy was performed  ?                          without difficulty. The patient tolerated the  ?                          procedure well. The quality of the bowel  ?                          preparation was adequate. The ileocecal valve,  ?                          appendiceal orifice, and rectum were photographed.  ?                          The bowel preparation used was GoLYTELY via split  ?                          dose instruction. ?Scope In: 2:49:57 PM ?Scope Out: 3:01:25 PM ?Scope Withdrawal Time: 0 hours 7 minutes 35 seconds  ?Total Procedure Duration: 0 hours 11 minutes 28 seconds  ?Findings:                 The perianal and digital rectal examinations were  ?                          normal. Pertinent negatives include normal  ?                          sphincter tone and no palpable rectal lesions. ?                          The colon (entire examined portion) appeared normal. ?  The retroflexed view of the distal rectum and anal  ?                          verge was normal and showed no anal or rectal  ?                          abnormalities. ?Complications:            No immediate complications. ?Estimated Blood Loss:     Estimated blood loss: none. ?Impression:               - The entire examined colon is normal. ?                          - The distal rectum and anal verge are normal on  ?                          retroflexion view. ?                          - No specimens collected. ?Recommendation:           - Patient has a contact number available for  ?                          emergencies. The signs and symptoms of potential  ?                          delayed complications were discussed with the  ?                          patient. Return to normal activities tomorrow.  ?                           Written discharge instructions were provided to the  ?                          patient. ?                          - Resume previous diet. ?                          - Continue present medications. ?                          - Repeat colonoscopy in 10 years for screening  ?                          purposes. ?Jamaira Sherk E. Candis Schatz, MD ?12/08/2021 3:09:46 PM ?This report has been signed electronically. ?

## 2021-12-12 ENCOUNTER — Telehealth: Payer: Self-pay | Admitting: *Deleted

## 2021-12-12 NOTE — Telephone Encounter (Signed)
?  Follow up Call- ? ?Call back number 12/08/2021  ?Post procedure Call Back phone  # [phone]  ?Permission to leave phone message Yes  ?Some recent data might be hidden  ?  ? ?Patient questions: ? ?Do you have a fever, pain , or abdominal swelling? No. ?Pain Score  0 * ? ?Have you tolerated food without any problems? Yes.   ? ?Have you been able to return to your normal activities? Yes.   ? ?Do you have any questions about your discharge instructions: ?Diet   No. ?Medications  No. ?Follow up visit  No. ? ?Do you have questions or concerns about your Care? No. ? ?Actions: ?* If pain score is 4 or above: ?No action needed, pain <4. ? ?Have you developed a fever since your procedure? no ? ?2.   Have you had an respiratory symptoms (SOB or cough) since your procedure? no ? ?3.   Have you tested positive for COVID 19 since your procedure no ? ?4.   Have you had any family members/close contacts diagnosed with the COVID 19 since your procedure?  no ? ? ?If yes to any of these questions please route to Joylene John, RN and Joella Prince, RN ? ? ? ?

## 2022-02-27 NOTE — Progress Notes (Unsigned)
Acute Office Visit  Subjective:    Patient ID: Kim Tanner, female    DOB: [birth year], 51 y.o.   MRN: [id number]  No chief complaint on file.   HPI Patient is in today for ***  Outpatient Medications Prior to Visit  Medication Sig Dispense Refill   albuterol (PROVENTIL HFA;VENTOLIN HFA) 108 (90 Base) MCG/ACT inhaler Inhale 2 puffs into the lungs every 6 (six) hours as needed for wheezing or shortness of breath. 1 Inhaler 1   hydrochlorothiazide (HYDRODIURIL) 25 MG tablet Take 1 tablet (25 mg total) by mouth daily. 90 tablet 1   levothyroxine (SYNTHROID) 125 MCG tablet Take 1 tablet (125 mcg total) by mouth daily. 45 tablet 3   losartan (COZAAR) 50 MG tablet Take 1 tablet (50 mg total) by mouth daily. 90 tablet 1   meclizine (ANTIVERT) 25 MG tablet TAKE 1 TABLET(25 MG) BY MOUTH TWICE DAILY AS NEEDED FOR DIZZINESS 30 tablet 1   rosuvastatin (CRESTOR) 10 MG tablet Take 1 tablet (10 mg total) by mouth daily. 90 tablet 1   Vitamin D, Ergocalciferol, (DRISDOL) 1.25 MG (50000 UNIT) CAPS capsule Take 1 capsule (50,000 Units total) by mouth every 7 (seven) days. 12 capsule 1   No facility-administered medications prior to visit.    Allergies  Allergen Reactions   Compazine [Prochlorperazine Edisylate]     Review of Systems     Objective:    Physical Exam  There were no vitals taken for this visit.  Wt Readings from Last 3 Encounters:  12/08/21 266 lb (120.7 kg)  11/27/21 266 lb (120.7 kg)  11/07/21 266 lb 9.6 oz (120.9 kg)    Results for orders placed or performed in visit on 11/07/21  Thyroid Panel With TSH  Result Value Ref Range   TSH 4.650 (H) 0.450 - 4.500 uIU/mL   T4, Total 10.0 4.5 - 12.0 ug/dL   T3 Uptake Ratio 28 24 - 39 %   Free Thyroxine Index 2.8 1.2 - 4.9  CBC with Differential/Platelet  Result Value Ref Range   WBC 6.9 3.4 - 10.8 x10E3/uL   RBC 3.91 3.77 - 5.28 x10E6/uL   Hemoglobin 11.5 11.1 - 15.9 g/dL   Hematocrit 33.9 (L) 34.0 - 46.6 %   MCV  87 79 - 97 fL   MCH 29.4 26.6 - 33.0 pg   MCHC 33.9 31.5 - 35.7 g/dL   RDW 15.0 11.7 - 15.4 %   Platelets 259 150 - 450 x10E3/uL   Neutrophils 63 Not Estab. %   Lymphs 29 Not Estab. %   Monocytes 5 Not Estab. %   Eos 2 Not Estab. %   Basos 1 Not Estab. %   Neutrophils Absolute 4.4 1.4 - 7.0 x10E3/uL   Lymphocytes Absolute 2.0 0.7 - 3.1 x10E3/uL   Monocytes Absolute 0.4 0.1 - 0.9 x10E3/uL   EOS (ABSOLUTE) 0.1 0.0 - 0.4 x10E3/uL   Basophils Absolute 0.0 0.0 - 0.2 x10E3/uL   Immature Granulocytes 0 Not Estab. %   Immature Grans (Abs) 0.0 0.0 - 0.1 x10E3/uL  Comprehensive metabolic panel  Result Value Ref Range   Glucose 128 (H) 70 - 99 mg/dL   BUN 13 6 - 24 mg/dL   Creatinine, Ser 0.97 0.57 - 1.00 mg/dL   eGFR 72 >59 mL/min/1.73   BUN/Creatinine Ratio 13 9 - 23   Sodium 141 134 - 144 mmol/L   Potassium 3.7 3.5 - 5.2 mmol/L   Chloride 101 96 - 106 mmol/L   CO2 26 20 -  29 mmol/L   Calcium 9.5 8.7 - 10.2 mg/dL   Total Protein 6.9 6.0 - 8.5 g/dL   Albumin 4.1 3.8 - 4.8 g/dL   Globulin, Total 2.8 1.5 - 4.5 g/dL   Albumin/Globulin Ratio 1.5 1.2 - 2.2   Bilirubin Total 0.2 0.0 - 1.2 mg/dL   Alkaline Phosphatase 100 44 - 121 IU/L   AST 15 0 - 40 IU/L   ALT 17 0 - 32 IU/L  VITAMIN D 25 Hydroxy (Vit-D Deficiency, Fractures)  Result Value Ref Range   Vit D, 25-Hydroxy 23.5 (L) 30.0 - 100.0 ng/mL  Lipid panel  Result Value Ref Range   Cholesterol, Total 160 100 - 199 mg/dL   Triglycerides 63 0 - 149 mg/dL   HDL 44 >39 mg/dL   VLDL Cholesterol Cal 13 5 - 40 mg/dL   LDL Chol Calc (NIH) 103 (H) 0 - 99 mg/dL   Chol/HDL Ratio 3.6 0.0 - 4.4 ratio  Hemoglobin A1c  Result Value Ref Range   Hgb A1c MFr Bld 6.1 (H) 4.8 - 5.6 %   Est. average glucose Bld gHb Est-mCnc 128 mg/dL       Assessment & Plan:  There are no diagnoses linked to this encounter.   No orders of the defined types were placed in this encounter.   No follow-ups on file.  Irene Pap, PA-C

## 2022-02-28 ENCOUNTER — Encounter: Payer: Self-pay | Admitting: Physician Assistant

## 2022-02-28 ENCOUNTER — Ambulatory Visit (INDEPENDENT_AMBULATORY_CARE_PROVIDER_SITE_OTHER): Payer: BC Managed Care – PPO | Admitting: Physician Assistant

## 2022-02-28 VITALS — BP 130/80 | HR 72 | Ht 61.5 in | Wt 279.4 lb

## 2022-02-28 DIAGNOSIS — M545 Low back pain, unspecified: Secondary | ICD-10-CM

## 2022-02-28 DIAGNOSIS — M62830 Muscle spasm of back: Secondary | ICD-10-CM

## 2022-02-28 DIAGNOSIS — E559 Vitamin D deficiency, unspecified: Secondary | ICD-10-CM | POA: Diagnosis not present

## 2022-02-28 MED ORDER — METHYLPREDNISOLONE 4 MG PO TBPK: ORAL_TABLET | ORAL | 0 refills | Status: DC

## 2022-02-28 MED ORDER — METHOCARBAMOL 500 MG PO TABS: 500.0000 mg | ORAL_TABLET | Freq: Every evening | ORAL | 1 refills | Status: DC | PRN

## 2022-02-28 NOTE — Assessment & Plan Note (Signed)
Stable, is taking weekly Vitamin D prescription

## 2022-02-28 NOTE — Patient Instructions (Signed)
You can walk in for x-ray at ---  [facility] [address], [place] [address]  Phone [phone] Fax [phone]  Hours of Operation General hours of operation are Monday - Friday, 8 am-5 pm   You will get a call to schedule an appointment with Orthopedics

## 2022-03-01 ENCOUNTER — Ambulatory Visit
Admission: RE | Admit: 2022-03-01 | Discharge: 2022-03-01 | Disposition: A | Discharge status: Home / Self Care | Payer: BC Managed Care – PPO | Source: Ambulatory Visit | Attending: Physician Assistant | Admitting: Physician Assistant

## 2022-03-01 DIAGNOSIS — M545 Low back pain, unspecified: Secondary | ICD-10-CM

## 2022-03-01 DIAGNOSIS — M62830 Muscle spasm of back: Secondary | ICD-10-CM

## 2022-03-13 ENCOUNTER — Ambulatory Visit (INDEPENDENT_AMBULATORY_CARE_PROVIDER_SITE_OTHER): Payer: BC Managed Care – PPO | Admitting: Physician Assistant

## 2022-03-13 ENCOUNTER — Encounter: Payer: Self-pay | Admitting: Physician Assistant

## 2022-03-13 DIAGNOSIS — M545 Low back pain, unspecified: Secondary | ICD-10-CM | POA: Insufficient documentation

## 2022-03-13 DIAGNOSIS — M5440 Lumbago with sciatica, unspecified side: Secondary | ICD-10-CM | POA: Diagnosis not present

## 2022-03-13 NOTE — Progress Notes (Signed)
Office Visit Note   Patient: Kim Tanner           Date of Birth: [birth year]           MRN: [id number] Visit Date: 03/13/2022              Requested by: Irene Pap, PA-C [address] [place],  [place] [address] PCP: Irene Pap, [place]  Chief Complaint  Patient presents with   Lower Back - New Patient (Initial Visit)      HPI: Patient is a pleasant 51 year old woman with a 35-monthhistory of low back pain.  She denies any injury she denies any history of back surgeries or other issues.  She has been seen and evaluated was given a Medrol Dosepak which helped for a few days but then her pain came back.  She gets a burning pain she points to the paravertebral muscles on the right side of her lower back.  She denies any paresthesias any loss of bowel or bladder control any weakness in her legs she is also using Robaxin for some spasms she gets  Assessment & Plan: Visit Diagnoses:  1. Acute bilateral low back pain with sciatica, sciatica laterality unspecified   2. Acute right-sided low back pain with sciatica, sciatica laterality unspecified     Plan: Had a discussion with the patient she is neurologically intact.  I do think at this point she would benefit from modalities and physical therapy.  I will place an order for that she will come back after she is done about a month of physical therapy obviously if things get worse or she does not improve we could consider an MRI at that time  Follow-Up Instructions:   Ortho Exam  Patient is alert, oriented, no adenopathy, well-dressed, normal affect, normal respiratory effort. Examination patient appears well.  She has no tenderness over the spine no step-off or deformities.  She does have some tenderness in the lower back paravertebral muscles more on the right but admits that this can travel to the left.  Strength is 5 out of 5 with dorsiflexion plantarflexion of her ankles resisted flexion and extension of her  legs.   Imaging: No results found. No images are attached to the encounter.  Labs: Lab Results  Component Value Date   HGBA1C 6.1 (H) 11/07/2021   HGBA1C 5.6 12/12/2020   HGBA1C 6.0 (H) 08/24/2020     Lab Results  Component Value Date   ALBUMIN 4.1 11/07/2021   ALBUMIN 4.4 08/24/2020   ALBUMIN 4.0 11/11/2019    No results found for: "MG" Lab Results  Component Value Date   VD25OH 23.5 (L) 11/07/2021   VD25OH 25.6 (L) 12/05/2020   VD25OH 13.5 (L) 08/24/2020    No results found for: "PREALBUMIN"    Latest Ref Rng & Units 11/07/2021    3:53 PM 08/24/2020    9:26 AM 11/11/2019    9:01 AM  CBC EXTENDED  WBC 3.4 - 10.8 x10E3/uL 6.9  6.0  6.8   RBC 3.77 - 5.28 x10E6/uL 3.91  3.92  4.31   Hemoglobin 11.1 - 15.9 g/dL 11.5  11.5  12.0   HCT 34.0 - 46.6 % 33.9  34.5  35.4   Platelets 150 - 450 x10E3/uL 259  227  253   NEUT# 1.4 - 7.0 x10E3/uL 4.4  3.7  4.5   Lymph# 0.7 - 3.1 x10E3/uL 2.0  1.8  1.8      There is no height or weight  on file to calculate BMI.  Orders:  Orders Placed This Encounter  Procedures   Ambulatory referral to Physical Therapy   No orders of the defined types were placed in this encounter.    Procedures: No procedures performed  Clinical Data: No additional findings.  ROS:  All other systems negative, except as noted in the HPI. Review of Systems  Objective: Vital Signs: There were no vitals taken for this visit.  Specialty Comments:  No specialty comments available.  PMFS History: Patient Active Problem List   Diagnosis Date Noted   Low back pain 03/13/2022   Elevated serum creatinine 11/07/2021   OSA (obstructive sleep apnea) 08/24/2020   Mild persistent asthma without complication 77/41/2878   Orthopnea 12/09/2019   At risk for sleep apnea 12/09/2019   Snoring 12/09/2019   DOE (dyspnea on exertion) 12/09/2019   Vitamin D deficiency 05/11/2019   Pure hypercholesterolemia 05/11/2019   Multiple acquired skin tags 09/16/2018    Prediabetes 11/11/2017   Morbid obesity ([place]) 01/23/2017   Amenorrhea 01/23/2017   Right thyroid nodule 01/23/2017   Essential hypertension 01/23/2017   Graves disease 12/31/2016   Asthma    Past Medical History:  Diagnosis Date   Anxiety    Asthma    Back pain    Dyspnea    HTN (hypertension)    Joint pain    Lower extremity edema    Metabolic syndrome    Morbid obesity ([place])    Prediabetes    Sleep apnea    Uncontrolled hypertension 09/16/2018    Family History  Problem Relation Age of Onset   Hypertension Mother    Obesity Mother    Hypertension Father    Diabetes Father    Diabetes Sister    Stroke Sister 87   Breast cancer Neg Hx    Colon cancer Neg Hx    Rectal cancer Neg Hx    Stomach cancer Neg Hx    Esophageal cancer Neg Hx    Colon polyps Neg Hx     Past Surgical History:  Procedure Laterality Date   BREAST REDUCTION SURGERY  2008   CESAREAN SECTION     COLONOSCOPY     in [place]-normal exam about 9 years ago-no records per pt   DILATION AND CURETTAGE OF UTERUS     INDUCED ABORTION     REDUCTION MAMMAPLASTY     uterine ablation     Social History   Occupational History   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    Employer: [employer]  Tobacco Use   Smoking status: Never   Smokeless tobacco: Never  Vaping Use   Vaping Use: Never used  Substance and Sexual Activity   Alcohol use: Yes    Alcohol/week: 1.0 - 2.0 standard drink of alcohol    Types: 1 - 2 Glasses of wine per week    Comment: socially    Drug use: No   Sexual activity: Not Currently    Birth control/protection: Condom

## 2022-03-30 ENCOUNTER — Encounter: Payer: Self-pay | Admitting: Internal Medicine

## 2022-05-09 ENCOUNTER — Encounter (INDEPENDENT_AMBULATORY_CARE_PROVIDER_SITE_OTHER): Payer: Self-pay

## 2022-05-11 ENCOUNTER — Other Ambulatory Visit: Payer: Self-pay | Admitting: Physician Assistant

## 2022-05-11 DIAGNOSIS — E7800 Pure hypercholesterolemia, unspecified: Secondary | ICD-10-CM

## 2022-05-14 ENCOUNTER — Other Ambulatory Visit: Payer: Self-pay | Admitting: Physician Assistant

## 2022-05-14 DIAGNOSIS — E559 Vitamin D deficiency, unspecified: Secondary | ICD-10-CM

## 2022-06-06 ENCOUNTER — Encounter: Payer: Self-pay | Admitting: Internal Medicine

## 2022-06-30 ENCOUNTER — Other Ambulatory Visit: Payer: Self-pay | Admitting: Internal Medicine

## 2022-07-10 ENCOUNTER — Encounter: Payer: Self-pay | Admitting: Internal Medicine

## 2022-07-12 ENCOUNTER — Ambulatory Visit: Payer: BC Managed Care – PPO | Admitting: Medical

## 2022-07-12 VITALS — BP 122/80 | HR 74 | Wt 281.4 lb

## 2022-07-12 DIAGNOSIS — M545 Low back pain, unspecified: Secondary | ICD-10-CM | POA: Diagnosis not present

## 2022-07-12 DIAGNOSIS — G4733 Obstructive sleep apnea (adult) (pediatric): Secondary | ICD-10-CM

## 2022-07-12 DIAGNOSIS — I10 Essential (primary) hypertension: Secondary | ICD-10-CM | POA: Diagnosis not present

## 2022-07-12 DIAGNOSIS — M62830 Muscle spasm of back: Secondary | ICD-10-CM | POA: Diagnosis not present

## 2022-07-12 DIAGNOSIS — E7800 Pure hypercholesterolemia, unspecified: Secondary | ICD-10-CM

## 2022-07-12 DIAGNOSIS — R7303 Prediabetes: Secondary | ICD-10-CM

## 2022-07-12 DIAGNOSIS — E890 Postprocedural hypothyroidism: Secondary | ICD-10-CM | POA: Insufficient documentation

## 2022-07-12 DIAGNOSIS — E559 Vitamin D deficiency, unspecified: Secondary | ICD-10-CM

## 2022-07-12 DIAGNOSIS — G8929 Other chronic pain: Secondary | ICD-10-CM

## 2022-07-12 MED ORDER — GABAPENTIN 100 MG PO CAPS: 200.0000 mg | ORAL_CAPSULE | Freq: Every day | ORAL | 0 refills | Status: DC

## 2022-07-12 MED ORDER — TIZANIDINE HCL 4 MG PO TABS: 4.0000 mg | ORAL_TABLET | Freq: Every day | ORAL | 0 refills | Status: DC | PRN

## 2022-07-12 NOTE — Addendum Note (Signed)
Addended by: Minette Headland A on: 07/12/2022 04:12 PM   Modules accepted: Orders

## 2022-07-12 NOTE — Progress Notes (Signed)
Subjective:  Kim Tanner is a 51 y.o. female who presents for Chief Complaint  Patient presents with   follow-up    Follow-up on back pain and feeling off- off balance- takes meclinzine, back pain- muscle relaxers, PT didn't work due to work schedule      Here for follow up on back.   Was here months ago for back pain.  Had xrays, which reported didn't show anything but feels something is wrong.  The other day she moved a certain way, felt something immediately pull in right lower back.  Thinks something tore.  Had excruciating pain to the point of tears.  Has tried muscles relaxers, they made her sleepy and it didn't help much.   Had referral to PT but wasn't able to go with schedule conflicts.   No recent fall, trauma on injury.  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  No fever, no numbness or tingling in legs, no fall.   No incontinence.   No blood in stool or urine.   Using Aleve back and body.  She notes that she is also here for some blood work.  She has medical history consistent with prediabetes, vitamin D deficiency, hypothyroidism, hypertension, hyperlipidemia, asthma and sleep apnea.  She reports compliance with her medications for blood pressure cholesterol and thyroid.  She would like some blood work today for surveillance  She initially took vitamin D weekly prescription for several months but has been out of this for months at this point  She is non-smoker  Regarding prediabetes, she does not have a glucometer.  She notes that her diet is not where it should be  No other aggravating or relieving factors.    No other c/o.  Past Medical History:  Diagnosis Date   Anxiety    Asthma    Back pain    Dyspnea    HTN (hypertension)    Joint pain    Lower extremity edema    Metabolic syndrome    Morbid obesity ([place])    Prediabetes    Sleep apnea    Uncontrolled hypertension 09/16/2018   Current Outpatient Medications on File Prior to Visit  Medication Sig  Dispense Refill   albuterol (PROVENTIL HFA;VENTOLIN HFA) 108 (90 Base) MCG/ACT inhaler Inhale 2 puffs into the lungs every 6 (six) hours as needed for wheezing or shortness of breath. 1 Inhaler 1   hydrochlorothiazide (HYDRODIURIL) 25 MG tablet Take 1 tablet (25 mg total) by mouth daily. 90 tablet 1   levothyroxine (SYNTHROID) 125 MCG tablet TAKE 1 TABLET(125 MCG) BY MOUTH DAILY 45 tablet 3   losartan (COZAAR) 50 MG tablet Take 1 tablet (50 mg total) by mouth daily. 90 tablet 1   meclizine (ANTIVERT) 25 MG tablet TAKE 1 TABLET(25 MG) BY MOUTH TWICE DAILY AS NEEDED FOR DIZZINESS 30 tablet 1   rosuvastatin (CRESTOR) 10 MG tablet TAKE 1 TABLET(10 MG) BY MOUTH DAILY 90 tablet 1   Vitamin D, Ergocalciferol, (DRISDOL) 1.25 MG (50000 UNIT) CAPS capsule TAKE 1 CAPSULE BY MOUTH EVERY 7 DAYS 12 capsule 1   No current facility-administered medications on file prior to visit.     The following portions of the patient's history were reviewed and updated as appropriate: allergies, current medications, past family history, past medical history, past social history, past surgical history and problem list.  ROS Otherwise as in subjective above  Objective: BP 122/80   Pulse 74   Wt 281 lb 6.4 oz (127.6 kg)   BMI  52.31 kg/m   General appearance: alert, no distress, well developed, well nourished Oral cavity: MMM, no lesions Neck: supple, no lymphadenopathy, no thyromegaly, no masses Abdomen: +bs, soft, non tender, non distended, no masses, no hepatomegaly, no splenomegaly Back with tenderness in the paraspinal bilateral lumbar region, no midline tenderness, range of motion decreased mildly due to pain but relatively full, no swelling or deformity Neuro: Normal heel and toe walk, DTRs within normal limits, strength seems slightly decreased with lower legs but still 5/5 strength So in general neurovascularly intact of the back and legs Pulses: 2+ radial pulses, 2+ pedal pulses, normal cap refill Ext: no  edema   Assessment: Encounter Diagnoses  Name Primary?   Chronic bilateral low back pain without sciatica Yes   Muscle spasm of back    Essential hypertension    Prediabetes    Pure hypercholesterolemia    Vitamin D deficiency    OSA (obstructive sleep apnea)    Postablative hypothyroidism      Plan: Chronic back pain, muscle spasm of back I reviewed her June 2023 lumbar back x-ray that is completely normal We discussed that she does not have any radicular symptoms I suspect this is still a musculoskeletal issue.  Referral to physical therapy but she needs after hours or weekend hours which was what limited her ability to start physical therapy up to this point Begin gabapentin at nighttime to help with pain, continue Aleve as needed, try tizanidine muscle laxer as needed for spasm.  Caution on risk of medication such as sedation Work on stretching regularly I have advised we hold off on MRI until trial of physical therapy.  I do not suspect any surgical concern at this point   Hypertension-continue current medication  Hyperlipidemia-continue current medication  Prediabetes-continue efforts with exercise but need to work on diet changes, improving lifestyle, updated hemoglobin A1c today.  She does not have a glucometer.  She does not want a glucometer.  Vitamin D deficiency-has not been on therapy in the last few months, updated labs today  Postablative hypothyroidism-continue current medication, routine labs with thyroid today.  She sees endocrinology but will not see them again for a couple months and wanted to have labs today   History of untreated sleep apnea-discussed the need to lose weight through healthy diet and exercise.  She did not want to discuss this further today.  No prior CPAP device  Kim Tanner was seen today for follow-up.  Diagnoses and all orders for this visit:  Chronic bilateral low back pain without sciatica  Muscle spasm of back  Essential  hypertension  Prediabetes -     Hemoglobin A1c  Pure hypercholesterolemia  Vitamin D deficiency -     VITAMIN D 25 Hydroxy (Vit-D Deficiency, Fractures)  OSA (obstructive sleep apnea)  Postablative hypothyroidism -     TSH + free T4  Other orders -     tiZANidine (ZANAFLEX) 4 MG tablet; Take 1 tablet (4 mg total) by mouth daily as needed for muscle spasms. -     gabapentin (NEURONTIN) 100 MG capsule; Take 2 capsules (200 mg total) by mouth at bedtime. Start 1 capsule QHS x 1 week, then increase to 2 capsules daily    Follow up: 38mo

## 2022-07-13 ENCOUNTER — Other Ambulatory Visit: Payer: Self-pay | Admitting: Medical

## 2022-07-13 DIAGNOSIS — I10 Essential (primary) hypertension: Secondary | ICD-10-CM

## 2022-07-13 LAB — TSH+FREE T4
Free T4: 1.09 ng/dL (ref 0.82–1.77)
TSH: 50.2 u[IU]/mL — ABNORMAL HIGH (ref 0.450–4.500)

## 2022-07-13 LAB — HEMOGLOBIN A1C
Est. average glucose Bld gHb Est-mCnc: 143 mg/dL
Hgb A1c MFr Bld: 6.6 % — ABNORMAL HIGH (ref 4.8–5.6)

## 2022-07-13 LAB — VITAMIN D 25 HYDROXY (VIT D DEFICIENCY, FRACTURES): Vit D, 25-Hydroxy: 26.9 ng/mL — ABNORMAL LOW (ref 30.0–100.0)

## 2022-07-13 MED ORDER — LOSARTAN POTASSIUM 50 MG PO TABS: 50.0000 mg | ORAL_TABLET | Freq: Every day | ORAL | 1 refills | Status: DC

## 2022-07-13 MED ORDER — LEVOTHYROXINE SODIUM 150 MCG PO TABS: 150.0000 ug | ORAL_TABLET | Freq: Every day | ORAL | 1 refills | Status: DC

## 2022-07-13 MED ORDER — VITAMIN D 50 MCG (2000 UT) PO CAPS: 1.0000 | ORAL_CAPSULE | Freq: Every day | ORAL | 3 refills | Status: DC

## 2022-07-13 MED ORDER — HYDROCHLOROTHIAZIDE 25 MG PO TABS: 25.0000 mg | ORAL_TABLET | Freq: Every day | ORAL | 1 refills | Status: DC

## 2022-07-16 ENCOUNTER — Ambulatory Visit: Payer: BC Managed Care – PPO | Admitting: Medical

## 2022-07-26 ENCOUNTER — Telehealth: Payer: Self-pay | Admitting: Physician Assistant

## 2022-07-26 NOTE — Telephone Encounter (Signed)
Pt called and stated after she seen you on 07/12/22 she is supposed to come back after 30 days for blood work. Is it ok to schedule her for this, I don't see orders?

## 2022-08-14 ENCOUNTER — Encounter: Payer: Self-pay | Admitting: Internal Medicine

## 2022-09-12 ENCOUNTER — Other Ambulatory Visit: Payer: Self-pay | Admitting: Medical

## 2022-09-12 ENCOUNTER — Telehealth: Payer: Self-pay | Admitting: Medical

## 2022-09-12 DIAGNOSIS — E559 Vitamin D deficiency, unspecified: Secondary | ICD-10-CM

## 2022-09-12 DIAGNOSIS — E0500 Thyrotoxicosis with diffuse goiter without thyrotoxic crisis or storm: Secondary | ICD-10-CM

## 2022-09-12 DIAGNOSIS — R7303 Prediabetes: Secondary | ICD-10-CM

## 2022-09-12 NOTE — Telephone Encounter (Signed)
Pt called & asked about her follow up appt, she would like to come in before her appt and have the fasting labs then come back and discuss them at her appt.  Is this ok?

## 2022-09-19 ENCOUNTER — Ambulatory Visit: Payer: BC Managed Care – PPO | Admitting: Family Medicine

## 2022-09-19 ENCOUNTER — Encounter: Payer: Self-pay | Admitting: Family Medicine

## 2022-09-19 VITALS — BP 140/98 | HR 70 | Temp 97.8°F | Ht 61.5 in | Wt 273.0 lb

## 2022-09-19 DIAGNOSIS — E7800 Pure hypercholesterolemia, unspecified: Secondary | ICD-10-CM | POA: Diagnosis not present

## 2022-09-19 DIAGNOSIS — E559 Vitamin D deficiency, unspecified: Secondary | ICD-10-CM | POA: Diagnosis not present

## 2022-09-19 DIAGNOSIS — E890 Postprocedural hypothyroidism: Secondary | ICD-10-CM

## 2022-09-19 DIAGNOSIS — R7303 Prediabetes: Secondary | ICD-10-CM

## 2022-09-19 DIAGNOSIS — I10 Essential (primary) hypertension: Secondary | ICD-10-CM

## 2022-09-19 DIAGNOSIS — R400 Somnolence: Secondary | ICD-10-CM

## 2022-09-19 DIAGNOSIS — G4733 Obstructive sleep apnea (adult) (pediatric): Secondary | ICD-10-CM | POA: Diagnosis not present

## 2022-09-19 LAB — LIPID PANEL
Cholesterol: 165 mg/dL (ref 0–200)
HDL: 38.4 mg/dL — ABNORMAL LOW (ref >39.00)
LDL Cholesterol: 111 mg/dL — ABNORMAL HIGH (ref 0–99)
NonHDL: 126.17
Total CHOL/HDL Ratio: 4
Triglycerides: 77 mg/dL (ref 0.0–149.0)
VLDL: 15.4 mg/dL (ref 0.0–40.0)

## 2022-09-19 LAB — CBC WITH DIFFERENTIAL/PLATELET
Basophils Absolute: 0 10*3/uL (ref 0.0–0.1)
Basophils Relative: 0.9 % (ref 0.0–3.0)
Eosinophils Absolute: 0.2 10*3/uL (ref 0.0–0.7)
Eosinophils Relative: 3 % (ref 0.0–5.0)
HCT: 33.1 % — ABNORMAL LOW (ref 36.0–46.0)
Hemoglobin: 11.2 g/dL — ABNORMAL LOW (ref 12.0–15.0)
Lymphocytes Relative: 29.8 % (ref 12.0–46.0)
Lymphs Abs: 1.6 10*3/uL (ref 0.7–4.0)
MCHC: 33.7 g/dL (ref 30.0–36.0)
MCV: 87.7 fl (ref 78.0–100.0)
Monocytes Absolute: 0.3 10*3/uL (ref 0.1–1.0)
Monocytes Relative: 5.9 % (ref 3.0–12.0)
Neutro Abs: 3.3 10*3/uL (ref 1.4–7.7)
Neutrophils Relative %: 60.4 % (ref 43.0–77.0)
Platelets: 237 10*3/uL (ref 150.0–400.0)
RBC: 3.78 Mil/uL — ABNORMAL LOW (ref 3.87–5.11)
RDW: 14.3 % (ref 11.5–15.5)
WBC: 5.5 10*3/uL (ref 4.0–10.5)

## 2022-09-19 LAB — COMPREHENSIVE METABOLIC PANEL
ALT: 13 U/L (ref 0–35)
AST: 15 U/L (ref 0–37)
Albumin: 3.9 g/dL (ref 3.5–5.2)
Alkaline Phosphatase: 86 U/L (ref 39–117)
BUN: 10 mg/dL (ref 6–23)
CO2: 31 mEq/L (ref 19–32)
Calcium: 9.1 mg/dL (ref 8.4–10.5)
Chloride: 104 mEq/L (ref 96–112)
Creatinine, Ser: 1.07 mg/dL (ref 0.40–1.20)
GFR: 61.13 mL/min (ref >60.00)
Glucose, Bld: 102 mg/dL — ABNORMAL HIGH (ref 70–99)
Potassium: 3.8 mEq/L (ref 3.5–5.1)
Sodium: 141 mEq/L (ref 135–145)
Total Bilirubin: 0.3 mg/dL (ref 0.2–1.2)
Total Protein: 6.9 g/dL (ref 6.0–8.3)

## 2022-09-19 MED ORDER — METFORMIN HCL ER 750 MG PO TB24: 750.0000 mg | ORAL_TABLET | Freq: Every day | ORAL | 0 refills | Status: DC

## 2022-09-19 NOTE — Progress Notes (Signed)
Subjective:     Patient ID: Kim Tanner, female    DOB: 1970/12/07, 51 y.o.   MRN: [id number]  Chief Complaint  Patient presents with   Hemoglobin A1c Screening    Would like A1c checked, last time was 6.6    HPI Patient is in today for follow up on chronic health conditions.   BP at home in 140s-150s/80s Reports taking HCTZ 25 daily and Losartan 50 mg daily   Dr. Cruzita Lederer manages her thyroid. She sees her later this week.   Hgb A1c 6.6% 2 months ago. She would like to start on medication. Metformin has caused GI upset in the past. Is not in favor of injectable medication  Vitamin D deficiency- taking 2,000 IUs  Obesity - wants to lose weight. Diet does not seem to be healthy or low calorie  She has a hx of OSA but never used a CPAP. States she was told she needs a new sleep test.   HL- taking Crestor 10 mg    Health Maintenance Due  Topic Date Due   HIV Screening  Never done   INFLUENZA VACCINE  Never done    Past Medical History:  Diagnosis Date   Anxiety    Asthma    Back pain    Dyspnea    HTN (hypertension)    Joint pain    Lower extremity edema    Metabolic syndrome    Morbid obesity ([place])    Prediabetes    Sleep apnea    Uncontrolled hypertension 09/16/2018    Past Surgical History:  Procedure Laterality Date   BREAST REDUCTION SURGERY  2008   CESAREAN SECTION     [place]     in [place]normal exam about 9 years ago-no records per pt   DILATION AND CURETTAGE OF UTERUS     INDUCED ABORTION     REDUCTION MAMMAPLASTY     uterine ablation      Family History  Problem Relation Age of Onset   Hypertension Mother    Obesity Mother    Hypertension Father    Diabetes Father    Diabetes Sister    Stroke Sister 61   Breast cancer Neg Hx    Colon cancer Neg Hx    Rectal cancer Neg Hx    Stomach cancer Neg Hx    Esophageal cancer Neg Hx    Colon polyps Neg Hx     Social History   Socioeconomic History   Marital status: Single     Spouse name: Not on file   Number of children: 2   Years of education: Not on file   Highest education level: Not on file  Occupational History   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    Employer: [employer]  Tobacco Use   Smoking status: Never   Smokeless tobacco: Never  Vaping Use   Vaping Use: Never used  Substance and Sexual Activity   Alcohol use: Yes    Alcohol/week: 1.0 - 2.0 standard drink of alcohol    Types: 1 - 2 Glasses of wine per week    Comment: socially    Drug use: No   Sexual activity: Not Currently    Birth control/protection: Condom  Other Topics Concern   Not on file  Social History Narrative   Not on file   Social Determinants of Health   Financial Resource Strain: Not on file  Food Insecurity: Not on file  Transportation Needs: Not on file  Physical Activity:  Not on file  Stress: Not on file  Social Connections: Not on file  Intimate Partner Violence: Not on file    Outpatient Medications Prior to Visit  Medication Sig Dispense Refill   albuterol (PROVENTIL HFA;VENTOLIN HFA) 108 (90 Base) MCG/ACT inhaler Inhale 2 puffs into the lungs every 6 (six) hours as needed for wheezing or shortness of breath. 1 Inhaler 1   Cholecalciferol (VITAMIN D) 50 MCG (2000 UT) CAPS Take 1 capsule (2,000 Units total) by mouth daily. 90 capsule 3   hydrochlorothiazide (HYDRODIURIL) 25 MG tablet Take 1 tablet (25 mg total) by mouth daily. 90 tablet 1   levothyroxine (SYNTHROID) 150 MCG tablet TAKE 1 TABLET(150 MCG) BY MOUTH DAILY BEFORE BREAKFAST 90 tablet 1   losartan (COZAAR) 50 MG tablet Take 1 tablet (50 mg total) by mouth daily. 90 tablet 1   meclizine (ANTIVERT) 25 MG tablet TAKE 1 TABLET(25 MG) BY MOUTH TWICE DAILY AS NEEDED FOR DIZZINESS 30 tablet 1   rosuvastatin (CRESTOR) 10 MG tablet TAKE 1 TABLET(10 MG) BY MOUTH DAILY 90 tablet 1   gabapentin (NEURONTIN) 100 MG capsule Take 2 capsules (200 mg total) by mouth at bedtime. Start 1 capsule QHS x 1 week,  then increase to 2 capsules daily 60 capsule 0   tiZANidine (ZANAFLEX) 4 MG tablet Take 1 tablet (4 mg total) by mouth daily as needed for muscle spasms. 30 tablet 0   No facility-administered medications prior to visit.    Allergies  Allergen Reactions   Compazine [Prochlorperazine Edisylate]     ROS     Objective:    Physical Exam Constitutional:      General: She is not in acute distress.    Appearance: She is not ill-appearing.  Cardiovascular:     Rate and Rhythm: Normal rate.  Pulmonary:     Effort: Pulmonary effort is normal.  Neurological:     General: No focal deficit present.     Mental Status: She is alert and oriented to person, place, and time.  Psychiatric:        Mood and Affect: Mood normal.        Behavior: Behavior normal.        Thought Content: Thought content normal.     BP (!) 140/98 (BP Location: Left Arm, Patient Position: Sitting, Cuff Size: Large)   Pulse 70   Temp 97.8 F (36.6 C) (Temporal)   Ht 5' 1.5" (1.562 m)   Wt 273 lb (123.8 kg)   SpO2 98%   BMI 50.75 kg/m  Wt Readings from Last 3 Encounters:  09/19/22 273 lb (123.8 kg)  07/12/22 281 lb 6.4 oz (127.6 kg)  02/28/22 279 lb 6.4 oz (126.7 kg)       Assessment & Plan:   Problem List Items Addressed This Visit       Cardiovascular and Mediastinum   Essential hypertension - Primary    Not at goal. Increase losartan to 100 mg daily and continue HCTZ 25 mg daily. Low sodium diet and new HST ordered due to hx of untreated sleep apnea.  Follow up in 4-6 weeks. Monitor BP at home.       Relevant Orders   CBC with Differential/Platelet (Completed)   Comprehensive metabolic panel (Completed)     Respiratory   OSA (obstructive sleep apnea)    Never used a CPAP. Needs new sleep study in order to get CPAP machine and equipment.       Relevant Orders   Home sleep  test     Endocrine   Postablative hypothyroidism    Managed by Dr. Cruzita Lederer        Other   Daytime somnolence     Needs CPAP but required to have new sleep study. HST ordered.       Relevant Orders   Home sleep test   Morbid obesity ([place])    Food diary for the next few weeks. Cut back on sugar, carbohydrates. She drinks sugary beverages and encouraged to cut back.start metformin today. Follow up in 4-6 wks.       Relevant Medications   metFORMIN (GLUCOPHAGE-XR) 750 MG 24 hr tablet   Prediabetes    Her last A1c was 6.6%. we discussed how this is technically considered diabetes. She will follow start metformin, make healthy diet changes and follow up in 4-6 wks.       Relevant Medications   metFORMIN (GLUCOPHAGE-XR) 750 MG 24 hr tablet   Pure hypercholesterolemia    Check fasting lipids. Continue statin therapy       Relevant Orders   Lipid panel (Completed)   Vitamin D deficiency    Continue vitamin D supplement      Visit time 30 minutes in face to face communication with patient and coordination of care, additional 12 minutes spent in record review, coordination or care, ordering tests, communicating/referring to other healthcare professionals, documenting in medical records all on the same day of the visit for total time 42 minutes spent on the visit.    I have discontinued Tate Swails's tiZANidine and gabapentin. I am also having her start on metFORMIN. Additionally, I am having her maintain her albuterol, meclizine, rosuvastatin, Vitamin D, losartan, hydrochlorothiazide, and levothyroxine.  Meds ordered this encounter  Medications   metFORMIN (GLUCOPHAGE-XR) 750 MG 24 hr tablet    Sig: Take 1 tablet (750 mg total) by mouth daily with breakfast.    Dispense:  90 tablet    Refill:  0    Order Specific Question:   Supervising Provider    Answer:   Pricilla Holm A [[id number]]

## 2022-09-19 NOTE — Assessment & Plan Note (Signed)
Not at goal. Increase losartan to 100 mg daily and continue HCTZ 25 mg daily. Low sodium diet and new HST ordered due to hx of untreated sleep apnea.  Follow up in 4-6 weeks. Monitor BP at home.

## 2022-09-19 NOTE — Patient Instructions (Signed)
Please go downstairs for labs today.  Increase losartan to 100 mg daily for your blood pressure.  Continue HCTZ 25 mg daily. Eat a low-sodium diet. Monitor your blood pressure over the next 4 to 6 weeks and follow-up with me.  Start Glucophage extended release once daily with breakfast.  This will help lower your blood sugars and help with weight loss.  Track your calories with a free app such as my fitness pal for the next 4 to 6 weeks and try to reduce sugar and carbohydrates as well as foods high in fat.  Continue your other medications and follow-up with Dr. Cruzita Lederer as scheduled.  I have ordered a home sleep test for you so that we can reevaluate your sleep apnea and get a CPAP machine if appropriate.

## 2022-09-19 NOTE — Assessment & Plan Note (Signed)
Continue vitamin D supplement

## 2022-09-19 NOTE — Assessment & Plan Note (Signed)
Check fasting lipids. Continue statin therapy

## 2022-09-19 NOTE — Assessment & Plan Note (Signed)
Never used a CPAP. Needs new sleep study in order to get CPAP machine and equipment.

## 2022-09-19 NOTE — Assessment & Plan Note (Signed)
Her last A1c was 6.6%. we discussed how this is technically considered diabetes. She will follow start metformin, make healthy diet changes and follow up in 4-6 wks.

## 2022-09-19 NOTE — Assessment & Plan Note (Signed)
Food diary for the next few weeks. Cut back on sugar, carbohydrates. She drinks sugary beverages and encouraged to cut back.start metformin today. Follow up in 4-6 wks.

## 2022-09-19 NOTE — Assessment & Plan Note (Signed)
Needs CPAP but required to have new sleep study. HST ordered.

## 2022-09-19 NOTE — Assessment & Plan Note (Signed)
Managed by Dr. Cruzita Lederer

## 2022-09-21 ENCOUNTER — Encounter: Payer: Self-pay | Admitting: Internal Medicine

## 2022-09-21 ENCOUNTER — Ambulatory Visit (INDEPENDENT_AMBULATORY_CARE_PROVIDER_SITE_OTHER): Payer: BC Managed Care – PPO | Admitting: Internal Medicine

## 2022-09-21 VITALS — BP 132/88 | HR 78 | Ht 61.5 in | Wt 273.6 lb

## 2022-09-21 DIAGNOSIS — E041 Nontoxic single thyroid nodule: Secondary | ICD-10-CM | POA: Diagnosis not present

## 2022-09-21 DIAGNOSIS — E890 Postprocedural hypothyroidism: Secondary | ICD-10-CM

## 2022-09-21 NOTE — Progress Notes (Signed)
Patient ID: Kim Tanner, female   DOB: [birth year], 51 y.o.   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  Last visit 1 year and 5 months ago.  Interim history: She continues to have fatigue but also, weight gain and fluid retention.  Reviewed history: Patient was diagnosed with thyrotoxicosis in 11/2016.  She was referred to endocrinology then, but could not come at that time, and only got to see me for the first time in 11/2018.  At last visit, her TFTs were still thyrotoxic and her TSI antibodies were elevated, confirming Graves' disease.  We started methimazole 5 mg twice a day, but she did not come back for labs 1.5 months later...  She had another set of TFTs by PCP in 05/2019 and these appeared worse.    She tells me that before her labs in 05/2019, she was missing 2nd dose of MMI at the day.  We contacted her to start taking the medication consistently.  At last visit she was taking 5 mg twice a day.  Since then, we checked a thyroid uptake and scan (07/28/2019) and this was positive for Graves' disease: 4 hour I-123 uptake = 39%% (normal 5-20%) 24 hour I-123 uptake = 60%% (normal 10-30%)   IMPRESSION: Elevated 4 hour and 24 hour radio iodine uptakes consistent with hyperthyroidism. No focal scintigraphic abnormalities on imaging. Findings consistent with Graves disease.  She had RAI treatment (08/31/2019).  She felt well after RAI treatment, without complaints, however, she developed post ablation hypothyroidism in 12/2019.  We started levothyroxine 50 mcg daily and advised her to come back in 5 to 6 weeks for labs but she did not return. She saw PCP 7 months later and at that time TSH was still extremely high, at 74.8.  We subsequently increased the levothyroxine.  At our last visit she was on 125  mcg LT4 but at today's visit, she is on 150 mcg daily (increased by PCP in 07/2022).  She takes this: - in am or later if forgets - fasting - Keeps it in the kitchen - coffee + cream + sugar >45 min later - 2h from b'fast - no calcium - no iron - + multivitamins at night - no PPIs - off B complex (30 mcg biotin), now on B12 2500 mcg daily - on vitamin C daily  She mentions that before the last set of labs she was off levothyroxine for almost 2 weeks!  Reviewed her TFTs: Lab Results  Component Value Date   TSH 50.200 (H) 07/12/2022   TSH 4.650 (H) 11/07/2021   TSH 22.27 (H) 04/13/2021   TSH 74.800 (H) 08/24/2020   TSH 90.54 Repeated and verified X2. (H) 01/21/2020   TSH <0.005 (L) 11/11/2019   TSH <0.01 (L) 07/14/2019   TSH <0.005 (L) 05/11/2019   TSH <0.01 (L) 12/19/2018   TSH <0.006 (L) 11/18/2018   FREET4 1.09 07/12/2022   FREET4 0.92 04/13/2021   FREET4 0.57 (L) 08/24/2020   FREET4 0.19 (L) 01/21/2020   FREET4 1.09 11/11/2019   FREET4 1.34 07/14/2019   FREET4 2.37 (H) 05/11/2019   FREET4 1.53 12/19/2018   FREET4 2.14 (H) 11/18/2018   FREET4 1.69 09/16/2018   T3FREE 2.0 (L) 01/21/2020   T3FREE 3.8 07/14/2019   T3FREE 5.3 (H) 12/19/2018   T3FREE 3.8 12/26/2016   Her TSI's were elevated confirming  Graves' disease: Lab Results  Component Value Date   TSI 386 (H) 12/19/2018    Thyroid U/S (01/21/2017): Moderately heterogeneous thyroid of normal size, with a 1 cm nodule  Pt denies: - feeling nodules in neck - hoarseness - dysphagia - choking  No FH of thyroid cancer. No h/o radiation tx to head or neck except RAI treatment.. No recent contrast studies. No herbal supplements. No recent steroids use.   She also has a history of anxiety.  She saw Dr. Adair Patter in the weight management clinic. She is seeing at the [facility]. She tried Phentermine >> felt poorly >> stopped.   She has prediabetes and was started on Metformin by Dr. Leafy Ro 11/2018-then  came off, but now back on metformin (ER) per PCP.  Reviewed HbA1c levels: Lab Results  Component Value Date   HGBA1C 6.6 (H) 07/12/2022   HGBA1C 6.1 (H) 11/07/2021   HGBA1C 5.6 12/12/2020   She has a history of HTN-on HCTZ   She also has hyperlipidemia and lipids improved after starting Crestor: Lab Results  Component Value Date   CHOL 165 09/19/2022   HDL 38.40 (L) 09/19/2022   LDLCALC 111 (H) 09/19/2022   TRIG 77.0 09/19/2022   CHOLHDL 4 09/19/2022   ROS: + See HPI  Past Medical History:  Diagnosis Date   Anxiety    Asthma    Back pain    Dyspnea    HTN (hypertension)    Joint pain    Lower extremity edema    Metabolic syndrome    Morbid obesity ([place])    Prediabetes    Sleep apnea    Uncontrolled hypertension 09/16/2018   Past Surgical History:  Procedure Laterality Date   BREAST REDUCTION SURGERY  2008   CESAREAN SECTION     [place]     [place] exam about 9 years ago-no records per pt   DILATION AND CURETTAGE OF UTERUS     INDUCED ABORTION     REDUCTION MAMMAPLASTY     uterine ablation     Social History   Socioeconomic History   Marital status: Single    Spouse name: Not on file   Number of children: 2   Years of education: Not on file   Highest education level: Not on file  Occupational History   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    Employer: [employer]  Tobacco Use   Smoking status: Never   Smokeless tobacco: Never  Vaping Use   Vaping Use: Never used  Substance and Sexual Activity   Alcohol use: Yes    Alcohol/week: 1.0 - 2.0 standard drink of alcohol    Types: 1 - 2 Glasses of wine per week    Comment: socially    Drug use: No   Sexual activity: Not Currently    Birth control/protection: Condom  Other Topics Concern   Not on file  Social History Narrative   Not on file   Social Determinants of Health   Financial Resource Strain: Not on file  Food Insecurity: Not on file  Transportation Needs: Not on file   Physical Activity: Not on file  Stress: Not on file  Social Connections: Not on file  Intimate Partner Violence: Not on file   Current Outpatient Medications on File Prior to Visit  Medication Sig Dispense Refill   albuterol (PROVENTIL HFA;VENTOLIN HFA) 108 (90 Base) MCG/ACT inhaler Inhale 2 puffs into the lungs every 6 (six) hours as needed for wheezing or shortness of breath.  1 Inhaler 1   Cholecalciferol (VITAMIN D) 50 MCG (2000 UT) CAPS Take 1 capsule (2,000 Units total) by mouth daily. 90 capsule 3   hydrochlorothiazide (HYDRODIURIL) 25 MG tablet Take 1 tablet (25 mg total) by mouth daily. 90 tablet 1   levothyroxine (SYNTHROID) 150 MCG tablet TAKE 1 TABLET(150 MCG) BY MOUTH DAILY BEFORE BREAKFAST 90 tablet 1   losartan (COZAAR) 50 MG tablet Take 1 tablet (50 mg total) by mouth daily. 90 tablet 1   meclizine (ANTIVERT) 25 MG tablet TAKE 1 TABLET(25 MG) BY MOUTH TWICE DAILY AS NEEDED FOR DIZZINESS 30 tablet 1   metFORMIN (GLUCOPHAGE-XR) 750 MG 24 hr tablet Take 1 tablet (750 mg total) by mouth daily with breakfast. 90 tablet 0   rosuvastatin (CRESTOR) 10 MG tablet TAKE 1 TABLET(10 MG) BY MOUTH DAILY 90 tablet 1   No current facility-administered medications on file prior to visit.   Allergies  Allergen Reactions   Compazine [Prochlorperazine Edisylate]    Family History  Problem Relation Age of Onset   Hypertension Mother    Obesity Mother    Hypertension Father    Diabetes Father    Diabetes Sister    Stroke Sister 87   Breast cancer Neg Hx    Colon cancer Neg Hx    Rectal cancer Neg Hx    Stomach cancer Neg Hx    Esophageal cancer Neg Hx    Colon polyps Neg Hx    PE: BP 132/88 (BP Location: Right Arm, Patient Position: Sitting, Cuff Size: Normal)   Pulse 78   Ht 5' 1.5" (1.562 m)   Wt 273 lb 9.6 oz (124.1 kg)   SpO2 99%   BMI 50.86 kg/m  Wt Readings from Last 3 Encounters:  09/21/22 273 lb 9.6 oz (124.1 kg)  09/19/22 273 lb (123.8 kg)  07/12/22 281 lb 6.4 oz  (127.6 kg)   Constitutional: overweight, in NAD Eyes: EOMI, no exophthalmos ENT: + very slight symmetric thyromegaly, no cervical lymphadenopathy Cardiovascular: RRR, No MRG Respiratory: CTA B Musculoskeletal: no deformities, strength intact in all 4 Skin: no rashes except acanthosis nigricans on neck, also skin tags on neck Neurological: no tremor with outstretched hands  ASSESSMENT: 1.  Graves' disease - Now status post RAI treatment  2.  Right thyroid nodule  3.  Prediabetes  PLAN:  1. Patient with a long history of untreated Graves' disease since at least 2018, without thyrotoxic symptoms. We tried methimazole but she was not very compliant with the medication on labs and her TFTs remained abnormal.  She finally had RAI treatment on 08/31/2019.  TSH was suppressed initially after RAI treatment, but it did increase significantly afterwards, to 90.  We started levothyroxine 50 mcg daily but she did not return for repeat labs.  When we finally recheck them again, TSH was 74.8.  We increased the dose of levothyroxine afterwards, with the latest increase at last visit, from 100 to 125 mcg daily.  At that time, she was off her levothyroxine for 4 days as she did not receive it from the pharmacy.  In the last, we discussed about the absolute importance of not missing doses and about possible consequences of uncontrolled hypothyroidism to include weight gain, fatigue, depression, hyperlipidemia, hypertension, bradycardia, heart block, pericarditis.  We also discussed that if her TSH remains high and she demonstrates noncompliance with levothyroxine, we will not be able to let her drive due to the risk of falling asleep at the wheel. - At today's visit,  she returns after an absence of 1 year and 5 months. - latest thyroid labs reviewed with pt. >> TSH was very high after she was off levothyroxine for almost 2 weeks: Lab Results  Component Value Date   TSH 50.200 (H) 07/12/2022  - she continues  on LT4 150 mcg daily - pt feels good on this dose.  She gained approximately 9 pounds since last visit. - we discussed about taking the thyroid hormone every day, with water, >30 minutes before breakfast, separated by >4 hours from acid reflux medications, calcium, iron, multivitamins. Pt. is taking it correctly but she misses doses as she keeps the tablet in the kitchen.  She tries to take 2 tablets the next day after missing a dose.  I strongly advised her to move the levothyroxine on the nightstand, on her phone, to be the first thing that she does in the morning. - will check thyroid tests in approximately 5 to 6 weeks after the above change: TSH and fT4 -she has an upcoming appointment with PCP at the end of next month and would prefer to have labs there -Due to the uncontrolled hypothyroidism, I plan to see her back in 3 months  2.  Right thyroid nodule -Measuring 1 cm on the thyroid ultrasound from 2019 -No neck compression symptoms -This does not appear to be worrisome.  We can continue to follow her clinically  3.  Prediabetes -per PCP -Latest HbA1c was 6.6%, increased from 5.6%. -She was started back on metformin by PCP -She has an appointment with PCP next month  Philemon Kingdom, MD PhD [facility] Endocrinology

## 2022-09-21 NOTE — Patient Instructions (Addendum)
Please continue levothyroxine 150 mcg daily.  Take the thyroid hormone every day, with water, at least 30 minutes before breakfast, separated by at least 4 hours from: - acid reflux medications - calcium - iron - multivitamins  Please move the Levothyroxine to the nightstand.  Please have your PCP send me the thyroid results in January.  Please return for another visit in 3 months.

## 2022-09-29 NOTE — Telephone Encounter (Signed)
Looks like pt is seeing [facility]

## 2022-10-18 ENCOUNTER — Emergency Department (HOSPITAL_COMMUNITY)
Admission: EM | Admit: 2022-10-18 | Discharge: 2022-10-19 | Disposition: A | Discharge status: Home / Self Care | Payer: BC Managed Care – PPO | Attending: Emergency Medicine | Admitting: Emergency Medicine

## 2022-10-18 ENCOUNTER — Emergency Department (HOSPITAL_COMMUNITY): Payer: BC Managed Care – PPO

## 2022-10-18 ENCOUNTER — Encounter (HOSPITAL_COMMUNITY): Payer: Self-pay | Admitting: Emergency Medicine

## 2022-10-18 DIAGNOSIS — J45909 Unspecified asthma, uncomplicated: Secondary | ICD-10-CM | POA: Insufficient documentation

## 2022-10-18 DIAGNOSIS — D259 Leiomyoma of uterus, unspecified: Secondary | ICD-10-CM | POA: Insufficient documentation

## 2022-10-18 DIAGNOSIS — Z79899 Other long term (current) drug therapy: Secondary | ICD-10-CM | POA: Diagnosis not present

## 2022-10-18 DIAGNOSIS — J101 Influenza due to other identified influenza virus with other respiratory manifestations: Secondary | ICD-10-CM | POA: Diagnosis not present

## 2022-10-18 DIAGNOSIS — D72819 Decreased white blood cell count, unspecified: Secondary | ICD-10-CM | POA: Diagnosis not present

## 2022-10-18 DIAGNOSIS — Z1152 Encounter for screening for COVID-19: Secondary | ICD-10-CM | POA: Diagnosis not present

## 2022-10-18 DIAGNOSIS — R059 Cough, unspecified: Secondary | ICD-10-CM | POA: Diagnosis present

## 2022-10-18 DIAGNOSIS — I10 Essential (primary) hypertension: Secondary | ICD-10-CM | POA: Diagnosis not present

## 2022-10-18 DIAGNOSIS — D219 Benign neoplasm of connective and other soft tissue, unspecified: Secondary | ICD-10-CM

## 2022-10-18 DIAGNOSIS — J111 Influenza due to unidentified influenza virus with other respiratory manifestations: Secondary | ICD-10-CM

## 2022-10-18 LAB — CBC WITH DIFFERENTIAL/PLATELET
Abs Immature Granulocytes: 0.01 10*3/uL (ref 0.00–0.07)
Basophils Absolute: 0 10*3/uL (ref 0.0–0.1)
Basophils Relative: 1 %
Eosinophils Absolute: 0 10*3/uL (ref 0.0–0.5)
Eosinophils Relative: 0 %
HCT: 34.7 % — ABNORMAL LOW (ref 36.0–46.0)
Hemoglobin: 11.4 g/dL — ABNORMAL LOW (ref 12.0–15.0)
Immature Granulocytes: 0 %
Lymphocytes Relative: 10 %
Lymphs Abs: 0.4 10*3/uL — ABNORMAL LOW (ref 0.7–4.0)
MCH: 28.2 pg (ref 26.0–34.0)
MCHC: 32.9 g/dL (ref 30.0–36.0)
MCV: 85.9 fL (ref 80.0–100.0)
Monocytes Absolute: 0.4 10*3/uL (ref 0.1–1.0)
Monocytes Relative: 10 %
Neutro Abs: 3.1 10*3/uL (ref 1.7–7.7)
Neutrophils Relative %: 79 %
Platelets: 221 10*3/uL (ref 150–400)
RBC: 4.04 MIL/uL (ref 3.87–5.11)
RDW: 13.2 % (ref 11.5–15.5)
WBC: 3.9 10*3/uL — ABNORMAL LOW (ref 4.0–10.5)
nRBC: 0 % (ref 0.0–0.2)

## 2022-10-18 LAB — RESP PANEL BY RT-PCR (RSV, FLU A&B, COVID)  RVPGX2
Influenza A by PCR: POSITIVE — AB
Influenza B by PCR: NEGATIVE
Resp Syncytial Virus by PCR: NEGATIVE
SARS Coronavirus 2 by RT PCR: NEGATIVE

## 2022-10-18 LAB — COMPREHENSIVE METABOLIC PANEL
ALT: 21 U/L (ref 0–44)
AST: 26 U/L (ref 15–41)
Albumin: 3.6 g/dL (ref 3.5–5.0)
Alkaline Phosphatase: 72 U/L (ref 38–126)
Anion gap: 9 (ref 5–15)
BUN: 7 mg/dL (ref 6–20)
CO2: 25 mmol/L (ref 22–32)
Calcium: 8.8 mg/dL — ABNORMAL LOW (ref 8.9–10.3)
Chloride: 103 mmol/L (ref 98–111)
Creatinine, Ser: 1.26 mg/dL — ABNORMAL HIGH (ref 0.44–1.00)
GFR, Estimated: 52 mL/min — ABNORMAL LOW (ref >60)
Glucose, Bld: 120 mg/dL — ABNORMAL HIGH (ref 70–99)
Potassium: 3.3 mmol/L — ABNORMAL LOW (ref 3.5–5.1)
Sodium: 137 mmol/L (ref 135–145)
Total Bilirubin: 0.3 mg/dL (ref 0.3–1.2)
Total Protein: 6.9 g/dL (ref 6.5–8.1)

## 2022-10-18 LAB — I-STAT BETA HCG BLOOD, ED (MC, WL, AP ONLY): I-stat hCG, quantitative: <5 m[IU]/mL (ref <5)

## 2022-10-18 LAB — LIPASE, BLOOD: Lipase: 33 U/L (ref 11–51)

## 2022-10-18 LAB — LACTIC ACID, PLASMA: Lactic Acid, Venous: 0.8 mmol/L (ref 0.5–1.9)

## 2022-10-18 MED ORDER — ACETAMINOPHEN 325 MG PO TABS
650.0000 mg | ORAL_TABLET | Freq: Once | ORAL | Status: AC
  Administered 2022-10-18: 650 mg via ORAL
  Filled 2022-10-18: qty 2

## 2022-10-18 MED ORDER — LORAZEPAM 0.5 MG PO TABS
0.5000 mg | ORAL_TABLET | Freq: Once | ORAL | Status: AC
  Administered 2022-10-18: 0.5 mg via ORAL
  Filled 2022-10-18: qty 1

## 2022-10-18 MED ORDER — ONDANSETRON 4 MG PO TBDP
4.0000 mg | ORAL_TABLET | Freq: Once | ORAL | Status: AC
  Administered 2022-10-18: 4 mg via ORAL
  Filled 2022-10-18: qty 1

## 2022-10-18 NOTE — ED Provider Triage Note (Signed)
Emergency Medicine Provider Triage Evaluation Note  Kim Tanner , a 52 y.o. female  was evaluated in triage.  Pt complains of diffuse, but mainly lower abdominal pain for the past week, but worsening over the past few days. Reports fever, nausea, and vomiting started three days ago. Tmax 103F. She had advil earlier this AM. Reports diarrhea without hematochezia. Unsure melena as she has been taking pepto. No urinary symptoms, chest pain, or SOB.  Review of Systems  Positive:  Negative:   Physical Exam  BP (!) 153/84 (BP Location: Right Arm)   Pulse (!) 105   Temp (!) 103 F (39.4 C)   Resp 15   Ht '5\' 1"'$  (1.549 m)   Wt 113.4 kg   SpO2 100%   BMI 47.24 kg/m  Gen:   Awake, no distress   Resp:  Normal effort  MSK:   Moves extremities without difficulty  Other:  Diffuse abdominal tenderness, but mainly in the lower abdomen. Soft.   Medical Decision Making  Medically screening exam initiated at 9:16 PM.  Appropriate orders placed.  Kalis Friese was informed that the remainder of the evaluation will be completed by another provider, this initial triage assessment does not replace that evaluation, and the importance of remaining in the ED until their evaluation is complete.  CT ordered. IV being placed. Labs ordered Tylenol and Zofran ordered.    Sherrell Puller, [place] 10/18/22 2119

## 2022-10-18 NOTE — ED Triage Notes (Addendum)
Pt c/o lower abdominal pain that radiates to back, dizziness, and n/v for approx 1 week. Pt states she had fever of 103 yesterday that resolved with tylenol. Endorses shob and chills. Denies chest pain. Endorses sick contacts. Took Advil at approx 12pm today.

## 2022-10-19 ENCOUNTER — Emergency Department (HOSPITAL_COMMUNITY): Payer: BC Managed Care – PPO

## 2022-10-19 ENCOUNTER — Other Ambulatory Visit (HOSPITAL_COMMUNITY): Payer: BC Managed Care – PPO

## 2022-10-19 MED ORDER — LORAZEPAM 2 MG/ML IJ SOLN
1.0000 mg | Freq: Once | INTRAMUSCULAR | Status: AC
  Administered 2022-10-19: 1 mg via INTRAVENOUS
  Filled 2022-10-19: qty 1

## 2022-10-19 MED ORDER — IOHEXOL 350 MG/ML SOLN
100.0000 mL | Freq: Once | INTRAVENOUS | Status: AC | PRN
  Administered 2022-10-19: 100 mL via INTRAVENOUS

## 2022-10-19 MED ORDER — ONDANSETRON HCL 4 MG PO TABS: 4.0000 mg | ORAL_TABLET | Freq: Four times a day (QID) | ORAL | 0 refills | Status: DC

## 2022-10-19 NOTE — Discharge Instructions (Addendum)
Likely a viral infection, recommend over-the-counter pain medications like ibuprofen Tylenol for fever and pain control, nasal decongestions like Flonase and Zyrtec, Mucinex for cough.  If not eating recommend supplementing with Gatorade to help with electrolyte supplementation.  Given you Zofran please take as prescribed  Imaging shows that you have fibroids, is recommend that you get outpatient follow-up with an ultrasound please follow-up with your OB for reassessment.  Follow-up PCP for further evaluation.  Come back to the emergency department if you develop chest pain, shortness of breath, severe abdominal pain, uncontrolled nausea, vomiting, diarrhea.

## 2022-10-19 NOTE — ED Provider Notes (Signed)
[facility] EMERGENCY DEPARTMENT Provider Note   CSN: [id number] Arrival date & time: 10/18/22  2051     History  Chief Complaint  Patient presents with   Abdominal Pain    Kim Tanner is a 52 y.o. female.  HPI   Medical history including asthma, hypertension, prediabetes, sleep apnea presents complaints of abdominal pain.  Patient is a started about 5 days ago, states that she has been having fevers chills nonproductive cough and abdominal pain.  States abdominal pain in the right lower quadrant, has remained constant, she has associated nausea and vomiting as well as diarrhea, she denies any hematemesis coffee-ground emesis denies melena or bloody stools.  She denies any urinary symptoms.  She has no abdominal surgical history, she denies excessive NSAID use or alcohol use.  She is not vaccine against influenza.  She has no other complaints.  Home Medications Prior to Admission medications   Medication Sig Start Date End Date Taking? Authorizing Provider  albuterol (PROVENTIL HFA;VENTOLIN HFA) 108 (90 Base) MCG/ACT inhaler Inhale 2 puffs into the lungs every 6 (six) hours as needed for wheezing or shortness of breath. 03/13/18  Yes Henson, Vickie L, NP-C  Cholecalciferol (VITAMIN D) 50 MCG (2000 UT) CAPS Take 1 capsule (2,000 Units total) by mouth daily. 07/13/22  Yes Tysinger, Camelia Eng, PA-C  hydrochlorothiazide (HYDRODIURIL) 25 MG tablet Take 1 tablet (25 mg total) by mouth daily. 07/13/22  Yes Tysinger, Camelia Eng, PA-C  levothyroxine (SYNTHROID) 150 MCG tablet TAKE 1 TABLET(150 MCG) BY MOUTH DAILY BEFORE BREAKFAST Patient taking differently: Take 150 mcg by mouth daily before breakfast. 07/13/22  Yes Tysinger, Camelia Eng, PA-C  losartan (COZAAR) 50 MG tablet Take 1 tablet (50 mg total) by mouth daily. 07/13/22  Yes Tysinger, Camelia Eng, PA-C  meclizine (ANTIVERT) 25 MG tablet TAKE 1 TABLET(25 MG) BY MOUTH TWICE DAILY AS NEEDED FOR DIZZINESS Patient taking differently:  Take 25 mg by mouth 2 (two) times daily as needed for dizziness. 11/07/21  Yes Irene Pap, PA-C  metFORMIN (GLUCOPHAGE-XR) 750 MG 24 hr tablet Take 1 tablet (750 mg total) by mouth daily with breakfast. 09/19/22  Yes Henson, Vickie L, NP-C  ondansetron (ZOFRAN) 4 MG tablet Take 1 tablet (4 mg total) by mouth every 6 (six) hours. 10/19/22  Yes Marcello Fennel, PA-C  rosuvastatin (CRESTOR) 10 MG tablet TAKE 1 TABLET(10 MG) BY MOUTH DAILY Patient taking differently: Take 10 mg by mouth daily. 05/11/22  Yes Denita Lung, MD      Allergies    Compazine [prochlorperazine edisylate]    Review of Systems   Review of Systems  Constitutional:  Negative for chills and fever.  Respiratory:  Positive for cough. Negative for shortness of breath.   Cardiovascular:  Negative for chest pain.  Gastrointestinal:  Positive for abdominal pain, diarrhea and vomiting.  Musculoskeletal:  Positive for myalgias.  Neurological:  Negative for headaches.    Physical Exam Updated Vital Signs BP 137/89 (BP Location: Left Arm)   Pulse 85   Temp 99.2 F (37.3 C)   Resp 18   Ht '5\' 1"'$  (1.549 m)   Wt 113.4 kg   SpO2 98%   BMI 47.24 kg/m  Physical Exam Vitals and nursing note reviewed.  Constitutional:      General: She is not in acute distress.    Appearance: She is not ill-appearing.  HENT:     Head: Normocephalic and atraumatic.     Nose: No congestion.  Eyes:  Conjunctiva/sclera: Conjunctivae normal.  Cardiovascular:     Rate and Rhythm: Normal rate and regular rhythm.     Pulses: Normal pulses.     Heart sounds: No murmur heard.    No friction rub. No gallop.  Pulmonary:     Effort: No respiratory distress.     Breath sounds: No wheezing, rhonchi or rales.  Abdominal:     Palpations: Abdomen is soft.     Tenderness: There is abdominal tenderness. There is no right CVA tenderness or left CVA tenderness.     Comments: Abdomen nondistended, soft, she has noted tenderness in her right  lower quadrant without guarding Mutaz or peritoneal sign negative Murphy sign McBurney point.  Skin:    General: Skin is warm and dry.  Neurological:     Mental Status: She is alert.  Psychiatric:        Mood and Affect: Mood normal.     ED Results / Procedures / Treatments   Labs (all labs ordered are listed, but only abnormal results are displayed) Labs Reviewed  RESP PANEL BY RT-PCR (RSV, FLU A&B, COVID)  RVPGX2 - Abnormal; Notable for the following components:      Result Value   Influenza A by PCR POSITIVE (*)    All other components within normal limits  CBC WITH DIFFERENTIAL/PLATELET - Abnormal; Notable for the following components:   WBC 3.9 (*)    Hemoglobin 11.4 (*)    HCT 34.7 (*)    Lymphs Abs 0.4 (*)    All other components within normal limits  COMPREHENSIVE METABOLIC PANEL - Abnormal; Notable for the following components:   Potassium 3.3 (*)    Glucose, Bld 120 (*)    Creatinine, Ser 1.26 (*)    Calcium 8.8 (*)    GFR, Estimated 52 (*)    All other components within normal limits  LIPASE, BLOOD  LACTIC ACID, PLASMA  URINALYSIS, ROUTINE W REFLEX MICROSCOPIC  LACTIC ACID, PLASMA  I-STAT BETA HCG BLOOD, ED ([facility], [facility], [facility] ONLY)    EKG None  Radiology CT ABDOMEN PELVIS W CONTRAST  Result Date: 10/19/2022 CLINICAL DATA:  Right lower quadrant pain. EXAM: CT ABDOMEN AND PELVIS WITH CONTRAST TECHNIQUE: Multidetector CT imaging of the abdomen and pelvis was performed using the standard protocol following bolus administration of intravenous contrast. RADIATION DOSE REDUCTION: This exam was performed according to the departmental dose-optimization program which includes automated exposure control, adjustment of the mA and/or kV according to patient size and/or use of iterative reconstruction technique. CONTRAST:  135m OMNIPAQUE IOHEXOL 350 MG/ML SOLN COMPARISON:  None Available. FINDINGS: Lower chest: A 3 mm calcified lung nodule is seen within the right lung base.  Hepatobiliary: No focal liver abnormality is seen. No gallstones, gallbladder wall thickening, or biliary dilatation. Pancreas: Unremarkable. No pancreatic ductal dilatation or surrounding inflammatory changes. Spleen: Normal in size without focal abnormality. Adrenals/Urinary Tract: Adrenal glands are unremarkable. Kidneys are normal, without renal calculi, focal lesion, or hydronephrosis. Bladder is unremarkable. Stomach/Bowel: Stomach is within normal limits. Appendix appears normal. No evidence of bowel wall thickening, distention, or inflammatory changes. Vascular/Lymphatic: No significant vascular findings are present. No enlarged abdominal or pelvic lymph nodes. Reproductive: A 1.5 cm well-defined hyperdense soft tissue mass is seen within the body of the uterus on the right. An additional 2.4 cm x 1.8 cm exophytic heterogeneous uterine mass is seen along the uterine fundus on the left. A 4.7 cm diameter simple cyst is seen within the cervix (approximately 26.57 Hounsfield units).  The bilateral adnexa are unremarkable. Other: No abdominal wall hernia or abnormality. No abdominopelvic ascites. Musculoskeletal: No acute or significant osseous findings. IMPRESSION: 1. Findings likely consistent with multiple uterine fibroids. Correlation with nonemergent pelvic ultrasound is recommended. 2. 4.7 cm diameter Nabothian cyst suspected within the cervix. Correlation with nonemergent pelvic ultrasound is recommended. 3. Normal appendix. Electronically Signed   By: Virgina Norfolk M.D.   On: 10/19/2022 03:01    Procedures Procedures    Medications Ordered in ED Medications  acetaminophen (TYLENOL) tablet 650 mg (650 mg Oral Given 10/18/22 2134)  ondansetron (ZOFRAN-ODT) disintegrating tablet 4 mg (4 mg Oral Given 10/18/22 2134)  LORazepam (ATIVAN) tablet 0.5 mg (0.5 mg Oral Given 10/18/22 2350)  LORazepam (ATIVAN) injection 1 mg (1 mg Intravenous Given 10/19/22 0140)  iohexol (OMNIPAQUE) 350 MG/ML injection  100 mL (100 mLs Intravenous Contrast Given 10/19/22 0255)    ED Course/ Medical Decision Making/ A&P                             Medical Decision Making Risk Prescription drug management.   This patient presents to the ED for concern of abdominal pain, this involves an extensive number of treatment options, and is a complaint that carries with it a high risk of complications and morbidity.  The differential diagnosis includes diverticulitis, bowel obstruction, appendicitis    Additional history obtained:  Additional history obtained from N/A External records from outside source obtained and reviewed including PCP notes   Co morbidities that complicate the patient evaluation  Diabetes  Social Determinants of Health:  N/A    Lab Tests:  I Ordered, and personally interpreted labs.  The pertinent results include: CBC shows slight leukocytopenia with a white count of 3.9, normocytic anemia hemoglobin 11.4, CMP shows potassium 3.3, glucose 120, creatinine 1.26, lipase 33, lactic 0.8, negative hCG, influenza positive   Imaging Studies ordered:  I ordered imaging studies including CT abdomen pelvis I independently visualized and interpreted imaging which showed CT imaging reveals multiple fibroids.  I agree with the radiologist interpretation   Cardiac Monitoring:  The patient was maintained on a cardiac monitor.  I personally viewed and interpreted the cardiac monitored which showed an underlying rhythm of: N/A   Medicines ordered and prescription drug management:  I ordered medication including Ativan, Tylenol I have reviewed the patients home medicines and have made adjustments as needed  Critical Interventions:  N/A   Reevaluation:  Presents with abdominal pain, triage obtain basic lab work imaging which I personally reviewed, it is significant for influenza, on my exam she is having right lower quadrant tenderness, I suspect this is likely secondary due to  influenza but with her fever and her lower quadrant pain I cannot fully rule out appendicitis, will send down for CT for further evaluation.  Patient has extreme claustrophobia, I will provide her with Ativan.  Reassessed the patient, updated her on her imaging, she is agreement discharge at this time.    Consultations Obtained:  N/a    Test Considered:  N/a    Rule out Suspicion for pneumonia is low at this time as lung sounds are clear bilaterally.  I doubt bowel obstruction, diverticulitis, appendicitis, AAA, pyelo-, kidney stone as CT imaging is all negative these findings.  I doubt patient needs to be hospitalized for influenza as she is nontoxic-appearing, vital signs are reassuring after Tylenol was given, she has no new oxygen requirements no evidence of respiratory  distress.    Dispostion and problem list  After consideration of the diagnostic results and the patients response to treatment, I feel that the patent would benefit from discharge.   Influenza-will recommend symptom management, will defer on antiviral treatment this time as she is outside the window and she has mild symptoms making my suspicion for adverse outcome low at this time. Fibroids-patient is made aware of these findings she will follow-up with her OB for outpatient ultrasound.  She was given copy of the imaging.            Final Clinical Impression(s) / ED Diagnoses Final diagnoses:  Influenza  Fibroids    Rx / DC Orders ED Discharge Orders          Ordered    ondansetron (ZOFRAN) 4 MG tablet  Every 6 hours        10/19/22 0328              Marcello Fennel, PA-C 10/19/22 0330    Maudie Flakes, MD 10/19/22 [phone]

## 2022-10-30 NOTE — Progress Notes (Deleted)
Subjective:     Patient ID: Kim Tanner, female    DOB: [birth year], 52 y.o.   MRN: [id number]  No chief complaint on file.   HPI Patient is in today for follow up chronic health conditions.   HTN-   Prediabetes-    OSA-   Vitamin D def-   HL-     Health Maintenance Due  Topic Date Due   HIV Screening  Never done   COVID-19 Vaccine (3 - Pfizer risk series) 01/16/2020   PAP SMEAR-Modifier  12/30/2020   INFLUENZA VACCINE  Never done    Past Medical History:  Diagnosis Date   Anxiety    Asthma    Back pain    Dyspnea    HTN (hypertension)    Joint pain    Lower extremity edema    Metabolic syndrome    Morbid obesity ([place])    Prediabetes    Sleep apnea    Uncontrolled hypertension 09/16/2018    Past Surgical History:  Procedure Laterality Date   BREAST REDUCTION SURGERY  2008   CESAREAN SECTION     COLONOSCOPY     in [place]-normal exam about 9 years ago-no records per pt   DILATION AND CURETTAGE OF UTERUS     INDUCED ABORTION     REDUCTION MAMMAPLASTY     uterine ablation      Family History  Problem Relation Age of Onset   Hypertension Mother    Obesity Mother    Hypertension Father    Diabetes Father    Diabetes Sister    Stroke Sister 67   Breast cancer Neg Hx    Colon cancer Neg Hx    Rectal cancer Neg Hx    Stomach cancer Neg Hx    Esophageal cancer Neg Hx    Colon polyps Neg Hx     Social History   Socioeconomic History   Marital status: Single    Spouse name: Not on file   Number of children: 2   Years of education: Not on file   Highest education level: Not on file  Occupational History   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    Employer: [employer]  Tobacco Use   Smoking status: Never   Smokeless tobacco: Never  Vaping Use   Vaping Use: Never used  Substance and Sexual Activity   Alcohol use: Yes    Alcohol/week: 1.0 - 2.0 standard drink of alcohol    Types: 1 - 2 Glasses of wine per week    Comment:  socially    Drug use: No   Sexual activity: Not Currently    Birth control/protection: Condom  Other Topics Concern   Not on file  Social History Narrative   Not on file   Social Determinants of Health   Financial Resource Strain: Not on file  Food Insecurity: Not on file  Transportation Needs: Not on file  Physical Activity: Not on file  Stress: Not on file  Social Connections: Not on file  Intimate Partner Violence: Not on file    Outpatient Medications Prior to Visit  Medication Sig Dispense Refill   albuterol (PROVENTIL HFA;VENTOLIN HFA) 108 (90 Base) MCG/ACT inhaler Inhale 2 puffs into the lungs every 6 (six) hours as needed for wheezing or shortness of breath. 1 Inhaler 1   Cholecalciferol (VITAMIN D) 50 MCG (2000 UT) CAPS Take 1 capsule (2,000 Units total) by mouth daily. 90 capsule 3   hydrochlorothiazide (HYDRODIURIL) 25 MG tablet Take 1  tablet (25 mg total) by mouth daily. 90 tablet 1   levothyroxine (SYNTHROID) 150 MCG tablet TAKE 1 TABLET(150 MCG) BY MOUTH DAILY BEFORE BREAKFAST (Patient taking differently: Take 150 mcg by mouth daily before breakfast.) 90 tablet 1   losartan (COZAAR) 50 MG tablet Take 1 tablet (50 mg total) by mouth daily. 90 tablet 1   meclizine (ANTIVERT) 25 MG tablet TAKE 1 TABLET(25 MG) BY MOUTH TWICE DAILY AS NEEDED FOR DIZZINESS (Patient taking differently: Take 25 mg by mouth 2 (two) times daily as needed for dizziness.) 30 tablet 1   metFORMIN (GLUCOPHAGE-XR) 750 MG 24 hr tablet Take 1 tablet (750 mg total) by mouth daily with breakfast. 90 tablet 0   ondansetron (ZOFRAN) 4 MG tablet Take 1 tablet (4 mg total) by mouth every 6 (six) hours. 12 tablet 0   rosuvastatin (CRESTOR) 10 MG tablet TAKE 1 TABLET(10 MG) BY MOUTH DAILY (Patient taking differently: Take 10 mg by mouth daily.) 90 tablet 1   No facility-administered medications prior to visit.    Allergies  Allergen Reactions   Compazine [Prochlorperazine Edisylate]     Jaw locked up     ROS     Objective:    Physical Exam  There were no vitals taken for this visit. Wt Readings from Last 3 Encounters:  10/18/22 250 lb (113.4 kg)  09/21/22 273 lb 9.6 oz (124.1 kg)  09/19/22 273 lb (123.8 kg)       Assessment & Plan:   Problem List Items Addressed This Visit       Cardiovascular and Mediastinum   Essential hypertension - Primary     Respiratory   OSA (obstructive sleep apnea)     Other   Morbid obesity ([place])   Prediabetes   Pure hypercholesterolemia   Vitamin D deficiency    I am having Marquette Saa maintain her albuterol, meclizine, rosuvastatin, Vitamin D, losartan, hydrochlorothiazide, levothyroxine, metFORMIN, and ondansetron.  No orders of the defined types were placed in this encounter.

## 2022-10-31 ENCOUNTER — Ambulatory Visit: Payer: BC Managed Care – PPO | Admitting: Family Medicine

## 2022-10-31 DIAGNOSIS — G4733 Obstructive sleep apnea (adult) (pediatric): Secondary | ICD-10-CM

## 2022-10-31 DIAGNOSIS — I10 Essential (primary) hypertension: Secondary | ICD-10-CM

## 2022-10-31 DIAGNOSIS — E559 Vitamin D deficiency, unspecified: Secondary | ICD-10-CM

## 2022-10-31 DIAGNOSIS — R7303 Prediabetes: Secondary | ICD-10-CM

## 2022-10-31 DIAGNOSIS — E7800 Pure hypercholesterolemia, unspecified: Secondary | ICD-10-CM

## 2022-11-05 ENCOUNTER — Ambulatory Visit (HOSPITAL_BASED_OUTPATIENT_CLINIC_OR_DEPARTMENT_OTHER): Payer: BC Managed Care – PPO | Admitting: Internal Medicine

## 2022-11-06 ENCOUNTER — Ambulatory Visit: Payer: BC Managed Care – PPO | Admitting: Family Medicine

## 2022-11-12 ENCOUNTER — Encounter: Payer: BC Managed Care – PPO | Admitting: Physician Assistant

## 2022-12-25 ENCOUNTER — Ambulatory Visit (HOSPITAL_BASED_OUTPATIENT_CLINIC_OR_DEPARTMENT_OTHER): Payer: BC Managed Care – PPO | Attending: Family Medicine | Admitting: Internal Medicine

## 2022-12-25 DIAGNOSIS — G4733 Obstructive sleep apnea (adult) (pediatric): Secondary | ICD-10-CM | POA: Insufficient documentation

## 2022-12-25 DIAGNOSIS — R400 Somnolence: Secondary | ICD-10-CM | POA: Diagnosis not present

## 2022-12-27 ENCOUNTER — Ambulatory Visit: Payer: BC Managed Care – PPO | Admitting: Internal Medicine

## 2022-12-27 DIAGNOSIS — G4733 Obstructive sleep apnea (adult) (pediatric): Secondary | ICD-10-CM

## 2022-12-27 NOTE — Procedures (Signed)
     Patient Name: Ludene, Horgen Date: 12/25/2022 Gender: Female D.O.B: 04/30/1971 Age (years): 46 Referring Provider: Girtha Rm NP Height (inches): 58 Interpreting Physician: Baird Lyons MD, ABSM Weight (lbs): 250 RPSGT: Jacolyn Reedy BMI: 47 MRN: [id number] Neck Size: 14.00  CLINICAL INFORMATION Sleep Study Type: HST Indication for sleep study: Insomnia with OSA Epworth Sleepiness Score: 11  SLEEP STUDY TECHNIQUE A multi-channel overnight portable sleep study was performed. The channels recorded were: nasal airflow, thoracic respiratory movement, and oxygen saturation with a pulse oximetry. Snoring was also monitored.  MEDICATIONS Patient self administered medications include: none reported.  SLEEP ARCHITECTURE Patient was studied for 364.5 minutes. The sleep efficiency was 100.0 % and the patient was supine for 0%. The arousal index was 0.0 per hour.  RESPIRATORY PARAMETERS The overall AHI was 21.1 per hour, with a central apnea index of 0 per hour. The oxygen nadir was 86% during sleep.  CARDIAC DATA Mean heart rate during sleep was 68.9 bpm.  IMPRESSIONS - Moderate obstructive sleep apnea occurred during this study (AHI = 21.1/h). - Moderate oxygen desaturation was noted during this study (Min O2 = 86%, Mean 97%). - Patient snored.  DIAGNOSIS - Obstructive Sleep Apnea (G47.33)  RECOMMENDATIONS - Suggest CPAP titration sleep study or autopap. Other options would be based on clinical judgment. - Avoid alcohol, sedatives and other CNS depressants that may worsen sleep apnea and disrupt normal sleep architecture. - Sleep hygiene should be reviewed to assess factors that may improve sleep quality. - Weight management and regular exercise should be initiated or continued.  [Electronically signed] 12/27/2022 01:36 PM  Baird Lyons MD, [place], American Board of Sleep Medicine NPI: [id number]                         [place], [place] [facility]  ELECTRONICALLY SIGNED ON:  12/27/2022, 1:33 PM [place] PH: [phone]   FX: [phone] [place]

## 2023-01-02 ENCOUNTER — Telehealth: Payer: Self-pay | Admitting: Family Medicine

## 2023-01-02 NOTE — Telephone Encounter (Signed)
Caller Name: Erinn Call back phone #: [phone]  Reason for Call: returning call for sleep study results

## 2023-01-02 NOTE — Telephone Encounter (Signed)
Returned call to pt to relay results. Pt had question that if she did in lab study if this would possibly change her diagnosis and may not need CPAP? Message sent to provider in another encounter.

## 2023-01-11 ENCOUNTER — Ambulatory Visit: Payer: BC Managed Care – PPO | Admitting: Family Medicine

## 2023-01-22 ENCOUNTER — Ambulatory Visit: Payer: BC Managed Care – PPO | Admitting: Family Medicine

## 2023-01-22 ENCOUNTER — Encounter: Payer: Self-pay | Admitting: Family Medicine

## 2023-01-22 VITALS — BP 134/86 | HR 70 | Temp 97.6°F | Ht 61.0 in | Wt 265.0 lb

## 2023-01-22 DIAGNOSIS — E559 Vitamin D deficiency, unspecified: Secondary | ICD-10-CM

## 2023-01-22 DIAGNOSIS — E7800 Pure hypercholesterolemia, unspecified: Secondary | ICD-10-CM

## 2023-01-22 DIAGNOSIS — I10 Essential (primary) hypertension: Secondary | ICD-10-CM | POA: Diagnosis not present

## 2023-01-22 DIAGNOSIS — E1169 Type 2 diabetes mellitus with other specified complication: Secondary | ICD-10-CM | POA: Diagnosis not present

## 2023-01-22 DIAGNOSIS — G4733 Obstructive sleep apnea (adult) (pediatric): Secondary | ICD-10-CM

## 2023-01-22 DIAGNOSIS — Z91199 Patient's noncompliance with other medical treatment and regimen due to unspecified reason: Secondary | ICD-10-CM

## 2023-01-22 DIAGNOSIS — D219 Benign neoplasm of connective and other soft tissue, unspecified: Secondary | ICD-10-CM | POA: Insufficient documentation

## 2023-01-22 DIAGNOSIS — J302 Other seasonal allergic rhinitis: Secondary | ICD-10-CM

## 2023-01-22 DIAGNOSIS — N950 Postmenopausal bleeding: Secondary | ICD-10-CM | POA: Insufficient documentation

## 2023-01-22 LAB — LIPID PANEL
Cholesterol: 183 mg/dL (ref 0–200)
HDL: 42.6 mg/dL (ref >39.00)
LDL Cholesterol: 124 mg/dL — ABNORMAL HIGH (ref 0–99)
NonHDL: 140.33
Total CHOL/HDL Ratio: 4
Triglycerides: 83 mg/dL (ref 0.0–149.0)
VLDL: 16.6 mg/dL (ref 0.0–40.0)

## 2023-01-22 LAB — COMPREHENSIVE METABOLIC PANEL
ALT: 12 U/L (ref 0–35)
AST: 15 U/L (ref 0–37)
Albumin: 3.9 g/dL (ref 3.5–5.2)
Alkaline Phosphatase: 76 U/L (ref 39–117)
BUN: 9 mg/dL (ref 6–23)
CO2: 30 mEq/L (ref 19–32)
Calcium: 9.1 mg/dL (ref 8.4–10.5)
Chloride: 103 mEq/L (ref 96–112)
Creatinine, Ser: 0.92 mg/dL (ref 0.40–1.20)
GFR: 73.1 mL/min (ref >60.00)
Glucose, Bld: 104 mg/dL — ABNORMAL HIGH (ref 70–99)
Potassium: 4.1 mEq/L (ref 3.5–5.1)
Sodium: 138 mEq/L (ref 135–145)
Total Bilirubin: 0.4 mg/dL (ref 0.2–1.2)
Total Protein: 6.8 g/dL (ref 6.0–8.3)

## 2023-01-22 LAB — CBC WITH DIFFERENTIAL/PLATELET
Basophils Absolute: 0.1 10*3/uL (ref 0.0–0.1)
Basophils Relative: 0.9 % (ref 0.0–3.0)
Eosinophils Absolute: 0.2 10*3/uL (ref 0.0–0.7)
Eosinophils Relative: 2.7 % (ref 0.0–5.0)
HCT: 35.5 % — ABNORMAL LOW (ref 36.0–46.0)
Hemoglobin: 12 g/dL (ref 12.0–15.0)
Lymphocytes Relative: 27.3 % (ref 12.0–46.0)
Lymphs Abs: 1.9 10*3/uL (ref 0.7–4.0)
MCHC: 33.7 g/dL (ref 30.0–36.0)
MCV: 82.8 fl (ref 78.0–100.0)
Monocytes Absolute: 0.3 10*3/uL (ref 0.1–1.0)
Monocytes Relative: 4.9 % (ref 3.0–12.0)
Neutro Abs: 4.4 10*3/uL (ref 1.4–7.7)
Neutrophils Relative %: 64.2 % (ref 43.0–77.0)
Platelets: 267 10*3/uL (ref 150.0–400.0)
RBC: 4.29 Mil/uL (ref 3.87–5.11)
RDW: 16.3 % — ABNORMAL HIGH (ref 11.5–15.5)
WBC: 6.9 10*3/uL (ref 4.0–10.5)

## 2023-01-22 LAB — MICROALBUMIN / CREATININE URINE RATIO
Creatinine,U: 76.1 mg/dL
Microalb Creat Ratio: 0.9 mg/g (ref 0.0–30.0)
Microalb, Ur: <0.7 mg/dL (ref 0.0–1.9)

## 2023-01-22 LAB — VITAMIN D 25 HYDROXY (VIT D DEFICIENCY, FRACTURES): VITD: 25.9 ng/mL — ABNORMAL LOW (ref 30.00–100.00)

## 2023-01-22 LAB — HEMOGLOBIN A1C: Hgb A1c MFr Bld: 6.1 % (ref 4.6–6.5)

## 2023-01-22 MED ORDER — LOSARTAN POTASSIUM 100 MG PO TABS: 100.0000 mg | ORAL_TABLET | Freq: Every day | ORAL | 0 refills | Status: DC

## 2023-01-22 NOTE — Progress Notes (Signed)
Subjective:     Patient ID: Kim Tanner, female    DOB: [birth year], 52 y.o.   MRN: [id number]  Chief Complaint  Patient presents with   Medical Management of Chronic Issues    F/u diabtetes, obesity and HTN. Only took metformin for 3 weeks, started having stomach issues, wants another option.  Discuss CPAP    HPI Patient is in today for follow up on multiple chronic health conditions.  I recommended follow up in December and have not seen her until today.   DM- last A1c 6.6% not on medication.  States she stopped taking metformin I prescribed in December 2023 and only took it for 3 wks. She had stomach pain and diarrhea.   HTN- reports taking losartan 100 mg and HCTZ 25 mg daily.   OSA- is ready to try using a CPAP. Has never used one in past.   Obesity- gained approx 15 lbs since January but down 8 lbs since December 2023.   Vitamin D def- taking OTC supplement some days.   HL- reports taking Crestor 10 mg 5/7 days per week on average.   Thyroid managed by Dr. Elvera Lennox.   States she was told she has fibroids on CT abdomen/pelvis  She also reports having 2 periods after not having a menstrual cycle in 2 years.  States she has an OB/GYN and will call to schedule.   Health Maintenance Due  Topic Date Due   FOOT EXAM  Never done   OPHTHALMOLOGY EXAM  Never done   HIV Screening  Never done   PAP SMEAR-Modifier  12/30/2020    Past Medical History:  Diagnosis Date   Anxiety    Asthma    Back pain    Dyspnea    HTN (hypertension)    Joint pain    Lower extremity edema    Metabolic syndrome    Morbid obesity    Prediabetes    Sleep apnea    Uncontrolled hypertension 09/16/2018    Past Surgical History:  Procedure Laterality Date   BREAST REDUCTION SURGERY  2008   CESAREAN SECTION     COLONOSCOPY     in [place]-normal exam about 9 years ago-no records per pt   DILATION AND CURETTAGE OF UTERUS     INDUCED ABORTION     REDUCTION MAMMAPLASTY     uterine  ablation      Family History  Problem Relation Age of Onset   Hypertension Mother    Obesity Mother    Hypertension Father    Diabetes Father    Diabetes Sister    Stroke Sister 29   Breast cancer Neg Hx    Colon cancer Neg Hx    Rectal cancer Neg Hx    Stomach cancer Neg Hx    Esophageal cancer Neg Hx    Colon polyps Neg Hx     Social History   Socioeconomic History   Marital status: Single    Spouse name: Not on file   Number of children: 2   Years of education: Not on file   Highest education level: Bachelor's degree (e.g., BA, AB, BS)  Occupational History   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    Employer: [employer]  Tobacco Use   Smoking status: Never   Smokeless tobacco: Never  Vaping Use   Vaping Use: Never used  Substance and Sexual Activity   Alcohol use: Yes    Alcohol/week: 1.0 - 2.0 standard drink of alcohol  Types: 1 - 2 Glasses of wine per week    Comment: socially    Drug use: No   Sexual activity: Not Currently    Birth control/protection: Condom  Other Topics Concern   Not on file  Social History Narrative   Not on file   Social Determinants of Health   Financial Resource Strain: Low Risk  (01/21/2023)   Overall Financial Resource Strain (CARDIA)    Difficulty of Paying Living Expenses: Not hard at all  Food Insecurity: No Food Insecurity (01/21/2023)   Hunger Vital Sign    Worried About Running Out of Food in the Last Year: Never true    Ran Out of Food in the Last Year: Never true  Transportation Needs: No Transportation Needs (01/21/2023)   PRAPARE - Administrator, Civil Service (Medical): No    Lack of Transportation (Non-Medical): No  Physical Activity: Insufficiently Active (01/21/2023)   Exercise Vital Sign    Days of Exercise per Week: 2 days    Minutes of Exercise per Session: 20 min  Stress: No Stress Concern Present (01/21/2023)   [employer] of Occupational Health - Occupational Stress  Questionnaire    Feeling of Stress : Only a little  Social Connections: Moderately Isolated (01/21/2023)   Social Connection and Isolation Panel [NHANES]    Frequency of Communication with Friends and Family: More than three times a week    Frequency of Social Gatherings with Friends and Family: Once a week    Attends Religious Services: More than 4 times per year    Active Member of [employer] or Organizations: No    Attends Engineer, structural: Not on file    Marital Status: Never married  Intimate Partner Violence: Not on file    Outpatient Medications Prior to Visit  Medication Sig Dispense Refill   albuterol (PROVENTIL HFA;VENTOLIN HFA) 108 (90 Base) MCG/ACT inhaler Inhale 2 puffs into the lungs every 6 (six) hours as needed for wheezing or shortness of breath. 1 Inhaler 1   Cholecalciferol (VITAMIN D) 50 MCG (2000 UT) CAPS Take 1 capsule (2,000 Units total) by mouth daily. 90 capsule 3   hydrochlorothiazide (HYDRODIURIL) 25 MG tablet Take 1 tablet (25 mg total) by mouth daily. 90 tablet 1   levothyroxine (SYNTHROID) 150 MCG tablet TAKE 1 TABLET(150 MCG) BY MOUTH DAILY BEFORE BREAKFAST (Patient taking differently: Take 150 mcg by mouth daily before breakfast.) 90 tablet 1   meclizine (ANTIVERT) 25 MG tablet TAKE 1 TABLET(25 MG) BY MOUTH TWICE DAILY AS NEEDED FOR DIZZINESS (Patient taking differently: Take 25 mg by mouth 2 (two) times daily as needed for dizziness.) 30 tablet 1   rosuvastatin (CRESTOR) 10 MG tablet TAKE 1 TABLET(10 MG) BY MOUTH DAILY (Patient taking differently: Take 10 mg by mouth daily.) 90 tablet 1   losartan (COZAAR) 50 MG tablet Take 1 tablet (50 mg total) by mouth daily. 90 tablet 1   metFORMIN (GLUCOPHAGE-XR) 750 MG 24 hr tablet Take 1 tablet (750 mg total) by mouth daily with breakfast. (Patient not taking: Reported on 01/22/2023) 90 tablet 0   ondansetron (ZOFRAN) 4 MG tablet Take 1 tablet (4 mg total) by mouth every 6 (six) hours. (Patient not taking: Reported  on 01/22/2023) 12 tablet 0   No facility-administered medications prior to visit.    Allergies  Allergen Reactions   Compazine [Prochlorperazine Edisylate]     Jaw locked up    Review of Systems  Constitutional:  Negative for chills, fever  and weight loss.  Respiratory:  Negative for shortness of breath.   Cardiovascular:  Negative for chest pain, palpitations and leg swelling.  Gastrointestinal:  Positive for abdominal pain. Negative for constipation, diarrhea, nausea and vomiting.       Bloating  Genitourinary:  Negative for dysuria, frequency and urgency.       Vaginal bleeding post menopause  Neurological:  Negative for dizziness.       Objective:    Physical Exam Constitutional:      General: She is not in acute distress.    Appearance: She is not ill-appearing.  Eyes:     Extraocular Movements: Extraocular movements intact.     Conjunctiva/sclera: Conjunctivae normal.  Cardiovascular:     Rate and Rhythm: Normal rate.  Pulmonary:     Effort: Pulmonary effort is normal.  Musculoskeletal:     Cervical back: Normal range of motion and neck supple.  Skin:    General: Skin is warm and dry.  Neurological:     General: No focal deficit present.     Mental Status: She is alert and oriented to person, place, and time.  Psychiatric:        Mood and Affect: Mood normal.        Behavior: Behavior normal.        Thought Content: Thought content normal.     BP 134/86 (BP Location: Left Arm, Patient Position: Sitting, Cuff Size: Large)   Pulse 70   Temp 97.6 F (36.4 C) (Temporal)   Ht 5\' 1"  (1.549 m)   Wt 265 lb (120.2 kg)   SpO2 99%   BMI 50.07 kg/m  Wt Readings from Last 3 Encounters:  01/22/23 265 lb (120.2 kg)  12/25/22 250 lb (113.4 kg)  10/18/22 250 lb (113.4 kg)       Assessment & Plan:   Problem List Items Addressed This Visit       Cardiovascular and Mediastinum   Uncontrolled hypertension    Continue losartan 100 mg and HCTZ 25 mg daily. She  is reluctant to starting new medication. Recommend low sodium diet and closely monitoring BP at home. Starting CPAP for OSA may help to improve HTN. Follow up in 4 weeks with readings.       Relevant Medications   losartan (COZAAR) 100 MG tablet   Other Relevant Orders   CBC with Differential/Platelet (Completed)   Comprehensive metabolic panel (Completed)     Respiratory   OSA (obstructive sleep apnea) - Primary    Sleep test shows moderate OSA. She is willing to start using CPAP, ordered. Follow up in 4 wks.       Relevant Orders   For home use only DME continuous positive airway pressure (CPAP)     Endocrine   Type 2 diabetes mellitus with morbid obesity   Relevant Medications   losartan (COZAAR) 100 MG tablet   Other Relevant Orders   Microalbumin / creatinine urine ratio (Completed)   CBC with Differential/Platelet (Completed)   Comprehensive metabolic panel (Completed)   Hemoglobin A1c (Completed)     Other   Fibroids   Personal history of noncompliance with medical treatment, presenting hazards to health    Encouraged medication compliance and CPAP compliance once she has the machine. Discussed importance of regular follow ups.       Post-menopausal bleeding    She has an OB/GYN and plans to call and schedule a visit. Discussed the significance of post menopausal bleeding and the urgency to have  a work up to rule out malignancy.       Pure hypercholesterolemia    Continue statin therapy, she is taking this only 5/7 days per week on average. Check fasting lipids.       Relevant Medications   losartan (COZAAR) 100 MG tablet   Other Relevant Orders   Lipid panel (Completed)   Seasonal allergies   Vitamin D deficiency    Check vitamin D level and follow up.       Relevant Orders   VITAMIN D 25 Hydroxy (Vit-D Deficiency, Fractures) (Completed)    I have discontinued Leilene Camus's losartan, metFORMIN, and ondansetron. I am also having her start on losartan.  Additionally, I am having her maintain her albuterol, meclizine, rosuvastatin, Vitamin D, hydrochlorothiazide, and levothyroxine.  Meds ordered this encounter  Medications   losartan (COZAAR) 100 MG tablet    Sig: Take 1 tablet (100 mg total) by mouth daily.    Dispense:  90 tablet    Refill:  0    Order Specific Question:   Supervising Provider    Answer:   Hillard Danker A [[id number]]

## 2023-01-22 NOTE — Patient Instructions (Addendum)
Please go downstairs for labs and a urine protein test before you leave.   Call and schedule with your OB/GYN for post-menopausal bleeding and fibroids!!! Let me know if you need a referral to assistance getting an appointment.   Monitor your blood pressure closely at home. If you are seeing readings consistently higher than 130/80 then your blood pressure is not controlled and we need to adjust your medications.   Try Xyzal and Flonase for your allergies   You should hear from the medical equipment company to get your CPAP and supplies.  Follow up with me if any concerns.   Call and schedule with Dr. Elvera Lennox for your thyroid management.   I will see you back in 4 weeks.

## 2023-01-22 NOTE — Assessment & Plan Note (Signed)
Continue statin therapy, she is taking this only 5/7 days per week on average. Check fasting lipids.

## 2023-01-22 NOTE — Assessment & Plan Note (Signed)
Encouraged medication compliance and CPAP compliance once she has the machine. Discussed importance of regular follow ups.

## 2023-01-22 NOTE — Assessment & Plan Note (Signed)
She has an OB/GYN and plans to call and schedule a visit. Discussed the significance of post menopausal bleeding and the urgency to have a work up to rule out malignancy.

## 2023-01-22 NOTE — Assessment & Plan Note (Signed)
Check vitamin D level and follow up 

## 2023-01-22 NOTE — Assessment & Plan Note (Signed)
Sleep test shows moderate OSA. She is willing to start using CPAP, ordered. Follow up in 4 wks.

## 2023-01-22 NOTE — Assessment & Plan Note (Signed)
Continue losartan 100 mg and HCTZ 25 mg daily. She is reluctant to starting new medication. Recommend low sodium diet and closely monitoring BP at home. Starting CPAP for OSA may help to improve HTN. Follow up in 4 weeks with readings.

## 2023-03-04 ENCOUNTER — Other Ambulatory Visit: Payer: Self-pay | Admitting: Internal Medicine

## 2023-03-21 ENCOUNTER — Other Ambulatory Visit: Payer: Self-pay | Admitting: Internal Medicine

## 2023-03-22 NOTE — Telephone Encounter (Signed)
Ok to refill? Last appt was 09/21/22, last TSH 07/12/22

## 2023-03-28 ENCOUNTER — Ambulatory Visit (INDEPENDENT_AMBULATORY_CARE_PROVIDER_SITE_OTHER): Payer: BC Managed Care – PPO | Admitting: Internal Medicine

## 2023-03-28 ENCOUNTER — Encounter: Payer: Self-pay | Admitting: Internal Medicine

## 2023-03-28 VITALS — BP 120/80 | HR 73 | Ht 61.0 in | Wt 273.0 lb

## 2023-03-28 DIAGNOSIS — E041 Nontoxic single thyroid nodule: Secondary | ICD-10-CM | POA: Diagnosis not present

## 2023-03-28 DIAGNOSIS — E890 Postprocedural hypothyroidism: Secondary | ICD-10-CM

## 2023-03-28 NOTE — Patient Instructions (Addendum)
Please continue levothyroxine 150 mcg daily.  Take the thyroid hormone every day, with water, at least 30 minutes before breakfast, separated by at least 4 hours from: - acid reflux medications - calcium - iron - multivitamins  Please move the Levothyroxine to the nightstand.  Please return for another visit in 4 months.

## 2023-03-28 NOTE — Progress Notes (Addendum)
Patient ID: Kim Tanner, female   DOB: 06-15-1971, 52 y.o.   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  Last visit 6 months ago.  Interim history: She continues to have fatigue but also, weight gain and fluid retention.  Reviewed history: Patient was diagnosed with thyrotoxicosis in 11/2016.  She was referred to endocrinology then, but could not come at that time, and only got to see me for the first time in 11/2018.  At last visit, her TFTs were still thyrotoxic and her TSI antibodies were elevated, confirming Graves' disease.  We started methimazole 5 mg twice a day, but she did not come back for labs 1.5 months later...  She had another set of TFTs by PCP in 05/2019 and these appeared worse.    She tells me that before her labs in 05/2019, she was missing 2nd dose of MMI at the day.  We contacted her to start taking the medication consistently.  At last visit she was taking 5 mg twice a day.  Since then, we checked a thyroid uptake and scan (07/28/2019) and this was positive for Graves' disease: 4 hour I-123 uptake = 39%% (normal 5-20%) 24 hour I-123 uptake = 60%% (normal 10-30%)   IMPRESSION: Elevated 4 hour and 24 hour radio iodine uptakes consistent with hyperthyroidism. No focal scintigraphic abnormalities on imaging. Findings consistent with Graves disease.  She had RAI treatment (08/31/2019).  She felt well after RAI treatment, without complaints, however, she developed post ablation hypothyroidism in 12/2019.  We started levothyroxine 50 mcg daily and advised her to come back in 5 to 6 weeks for labs but she did not return. She saw PCP 7 months later and at that time TSH was still extremely high, at 74.8.  We subsequently increased the levothyroxine.  She was on 125 mcg LT4 but the dose was then  increased by PCP in 07/2022 to 150 mcg daily.  At that time, she mentioned that she was off levothyroxine for almost 2 weeks!  At this visit, she tells me she ran out of the 150 mcg daily and is on 125 mcg daily for the last 2 weeks, previously off levothyroxine completely for ~3 days.  She takes this: - in am - fasting - still keeps it in the kitchen - coffee + cream + sugar > 30 min later - no calcium - no iron - + multivitamins at night - no PPIs - off B complex (30 mcg biotin), on B12 2500 mcg daiy - on vitamin C daily  Reviewed her TFTs: Lab Results  Component Value Date   TSH 50.200 (H) 07/12/2022   TSH 4.650 (H) 11/07/2021   TSH 22.27 (H) 04/13/2021   TSH 74.800 (H) 08/24/2020   TSH 90.54 Repeated and verified X2. (H) 01/21/2020   TSH <0.005 (L) 11/11/2019   TSH <0.01 (L) 07/14/2019   TSH <0.005 (L) 05/11/2019   TSH <0.01 (L) 12/19/2018   TSH <0.006 (L) 11/18/2018   FREET4 1.09 07/12/2022   FREET4 0.92 04/13/2021   FREET4 0.57 (L) 08/24/2020   FREET4 0.19 (L) 01/21/2020   FREET4 1.09 11/11/2019   FREET4 1.34 07/14/2019   FREET4 2.37 (H) 05/11/2019   FREET4 1.53 12/19/2018   FREET4 2.14 (H) 11/18/2018   FREET4 1.69 09/16/2018   T3FREE 2.0 (L) 01/21/2020   T3FREE 3.8 07/14/2019   T3FREE  5.3 (H) 12/19/2018   T3FREE 3.8 12/26/2016   Her TSI's were elevated confirming Graves' disease: Lab Results  Component Value Date   TSI 386 (H) 12/19/2018    Thyroid U/S (01/21/2017): Moderately heterogeneous thyroid of normal size, with a 1 cm nodule  Pt denies: - feeling nodules in neck - hoarseness - dysphagia - choking  No FH of thyroid cancer. No h/o radiation tx to head or neck except RAI treatment.. No recent contrast studies. No herbal supplements. No recent steroids use.   She also has a history of anxiety. She saw Dr. Rinaldo Ratel in the weight management clinic. [facility]. She tried Phentermine >> felt poorly >> stopped.  She has  prediabetes and was started on Metformin by Dr. Dalbert Garnet 11/2018-then came off, but now back on metformin (ER) per PCP.  Reviewed HbA1c levels: Lab Results  Component Value Date   HGBA1C 6.1 01/22/2023   HGBA1C 6.6 (H) 07/12/2022   HGBA1C 6.1 (H) 11/07/2021   She has a history of HTN-on HCTZ   She also has hyperlipidemia and lipids improved after starting Crestor: Lab Results  Component Value Date   CHOL 183 01/22/2023   HDL 42.60 01/22/2023   LDLCALC 124 (H) 01/22/2023   TRIG 83.0 01/22/2023   CHOLHDL 4 01/22/2023   ROS: + See HPI  Past Medical History:  Diagnosis Date   Anxiety    Asthma    Back pain    Dyspnea    HTN (hypertension)    Joint pain    Lower extremity edema    Metabolic syndrome    Morbid obesity (HCC)    Prediabetes    Sleep apnea    Uncontrolled hypertension 09/16/2018   Past Surgical History:  Procedure Laterality Date   BREAST REDUCTION SURGERY  2008   CESAREAN SECTION     [place]     in [place]normal exam about 9 years ago-no records per pt   DILATION AND CURETTAGE OF UTERUS     INDUCED ABORTION     REDUCTION MAMMAPLASTY     uterine ablation     Social History   Socioeconomic History   Marital status: Single    Spouse name: Not on file   Number of children: 2   Years of education: Not on file   Highest education level: Bachelor's degree (e.g., BA, AB, BS)  Occupational History   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    Employer: [employer]  Tobacco Use   Smoking status: Never   Smokeless tobacco: Never  Vaping Use   Vaping Use: Never used  Substance and Sexual Activity   Alcohol use: Yes    Alcohol/week: 1.0 - 2.0 standard drink of alcohol    Types: 1 - 2 Glasses of wine per week    Comment: socially    Drug use: No   Sexual activity: Not Currently    Birth control/protection: Condom  Other Topics Concern   Not on file  Social History Narrative   Not on file   Social Determinants of Health   Financial  Resource Strain: Low Risk  (01/21/2023)   Overall Financial Resource Strain (CARDIA)    Difficulty of Paying Living Expenses: Not hard at all  Food Insecurity: No Food Insecurity (01/21/2023)   Hunger Vital Sign    Worried About Running Out of Food in the Last Year: Never true    Ran Out of Food in the Last Year: Never true  Transportation Needs: No Transportation  Needs (01/21/2023)   PRAPARE - Administrator, Civil Service (Medical): No    Lack of Transportation (Non-Medical): No  Physical Activity: Insufficiently Active (01/21/2023)   Exercise Vital Sign    Days of Exercise per Week: 2 days    Minutes of Exercise per Session: 20 min  Stress: No Stress Concern Present (01/21/2023)   [employer] of Occupational Health - Occupational Stress Questionnaire    Feeling of Stress : Only a little  Social Connections: Moderately Isolated (01/21/2023)   Social Connection and Isolation Panel [NHANES]    Frequency of Communication with Friends and Family: More than three times a week    Frequency of Social Gatherings with Friends and Family: Once a week    Attends Religious Services: More than 4 times per year    Active Member of [employer] or Organizations: No    Attends Engineer, structural: Not on file    Marital Status: Never married  Intimate Partner Violence: Not on file   Current Outpatient Medications on File Prior to Visit  Medication Sig Dispense Refill   albuterol (PROVENTIL HFA;VENTOLIN HFA) 108 (90 Base) MCG/ACT inhaler Inhale 2 puffs into the lungs every 6 (six) hours as needed for wheezing or shortness of breath. 1 Inhaler 1   Cholecalciferol (VITAMIN D) 50 MCG (2000 UT) CAPS Take 1 capsule (2,000 Units total) by mouth daily. 90 capsule 3   hydrochlorothiazide (HYDRODIURIL) 25 MG tablet Take 1 tablet (25 mg total) by mouth daily. 90 tablet 1   levothyroxine (SYNTHROID) 150 MCG tablet TAKE 1 TABLET(150 MCG) BY MOUTH DAILY BEFORE BREAKFAST (Patient taking  differently: Take 150 mcg by mouth daily before breakfast.) 90 tablet 1   losartan (COZAAR) 100 MG tablet Take 1 tablet (100 mg total) by mouth daily. 90 tablet 0   meclizine (ANTIVERT) 25 MG tablet TAKE 1 TABLET(25 MG) BY MOUTH TWICE DAILY AS NEEDED FOR DIZZINESS (Patient taking differently: Take 25 mg by mouth 2 (two) times daily as needed for dizziness.) 30 tablet 1   rosuvastatin (CRESTOR) 10 MG tablet TAKE 1 TABLET(10 MG) BY MOUTH DAILY (Patient taking differently: Take 10 mg by mouth daily.) 90 tablet 1   No current facility-administered medications on file prior to visit.   Allergies  Allergen Reactions   Compazine [Prochlorperazine Edisylate]     Jaw locked up   Family History  Problem Relation Age of Onset   Hypertension Mother    Obesity Mother    Hypertension Father    Diabetes Father    Diabetes Sister    Stroke Sister 38   Breast cancer Neg Hx    Colon cancer Neg Hx    Rectal cancer Neg Hx    Stomach cancer Neg Hx    Esophageal cancer Neg Hx    Colon polyps Neg Hx    PE: BP 120/80   Pulse 73   Ht 5\' 1"  (1.549 m)   Wt 273 lb (123.8 kg)   SpO2 99%   BMI 51.58 kg/m  Wt Readings from Last 3 Encounters:  03/28/23 273 lb (123.8 kg)  01/22/23 265 lb (120.2 kg)  12/25/22 250 lb (113.4 kg)   Constitutional: overweight, in NAD Eyes: EOMI, no exophthalmos ENT: + very slight symmetric thyromegaly, no cervical lymphadenopathy Cardiovascular: RRR, No MRG Respiratory: CTA B Musculoskeletal: no deformities, strength intact in all 4 Skin: no rashes except acanthosis nigricans on neck, also skin tags on neck Neurological: no tremor with outstretched hands  ASSESSMENT: 1.  Graves' disease - Now status post RAI treatment  2.  Right thyroid nodule  3.  Prediabetes  PLAN:  1. Patient with long history of untreated Graves' disease since at least 2018, without thyrotoxic symptoms.  We tried methimazole but she was not very compliant with the medication and labs and her  TFTs remained abnormal.  She finally had RAI treatment in 08/2019.  TSH was suppressed initially after RAI treatment, but it did increase significantly afterwards, to 90.  We started levothyroxine 50 mcg daily but she did not return for repeat labs.  When we finally rechecked them again, TSH was 75.  We increased her levothyroxine dose afterwards, with the latest dose changed to 150 mcg daily. -At last visit, she returned after an absence of 1.5 years and her TSH was still high, at 50.  She has gained 9 pounds.  We discussed about the absolute importance of not missing insulin doses and about possible consequences of uncontrolled hypothyroidism to include weight gain, fatigue, depression, hyperlipidemia, hypertension, bradycardia, heart block, pericarditis, dementia.  I advised her that if she continues to demonstrate noncompliance with levothyroxine, we cannot allow her to drive due to the risk of falling asleep at the wheel and the slow reflexes. - latest thyroid labs reviewed with pt. >> TSH elevated, as mentioned above: Lab Results  Component Value Date   TSH 50.200 (H) 07/12/2022  - she was on LT4 150 mcg daily, but ran out and could not refill it at the pharmacy so she missed few days, after which she started on 125 mcg daily, which she had at home - pt feels good on this dose, but lost >20 pounds after our last visit and now gained some all back - we discussed about taking the thyroid hormone every day, with water, >30 minutes before breakfast, separated by >4 hours from acid reflux medications, calcium, iron, multivitamins. Pt. is taking it correctly.  Last visit, she was missing doses as she The tablet in the kitchen.  I strongly advised her to move the levothyroxine on the nightstand, on her phone, to be the first thing that she does in the morning. - will check thyroid tests today: TSH and fT4.  We discussed that if the TSH is elevated, I would encourage her to go back to the 150 mcg dose and  take it consistently and then return for repeat set of TFTs - If labs are abnormal, she will need to return for repeat TFTs in 1.5 months  2.  Right thyroid nodule -Measuring 1 cm per the thyroid ultrasound from 2018 -No neck compression symptoms -The nodule does not appear to be worrisome.  We can continue to follow her clinically  3.  Prediabetes -managed by PCP -Latest HbA1c was decreased, from 6.6% to 6.1%, on 01/22/2023 -She continues on metformin per PCP  Needs refills. Component     Latest Ref Rng 03/28/2023  TSH     0.35 - 5.50 uIU/mL 3.12   T4,Free(Direct)     0.60 - 1.60 ng/dL 6.29   Normal TFTs!  Carlus Pavlov, MD PhD [facility] Endocrinology

## 2023-03-29 LAB — TSH: TSH: 3.12 u[IU]/mL (ref 0.35–5.50)

## 2023-03-29 LAB — T4, FREE: Free T4: 1.07 ng/dL (ref 0.60–1.60)

## 2023-03-29 MED ORDER — LEVOTHYROXINE SODIUM 150 MCG PO TABS: 150.0000 ug | ORAL_TABLET | Freq: Every day | ORAL | 1 refills | Status: DC

## 2023-03-29 NOTE — Addendum Note (Signed)
Addended by: Carlus Pavlov on: 03/29/2023 12:17 PM   Modules accepted: Orders

## 2023-04-02 LAB — HM PAP SMEAR: HM Pap smear: NORMAL

## 2023-04-12 ENCOUNTER — Other Ambulatory Visit: Payer: Self-pay | Admitting: Internal Medicine

## 2023-04-12 ENCOUNTER — Telehealth: Payer: Self-pay

## 2023-04-12 ENCOUNTER — Ambulatory Visit: Payer: BC Managed Care – PPO | Admitting: Family Medicine

## 2023-04-12 MED ORDER — LEVOTHYROXINE SODIUM 125 MCG PO TABS: 125.0000 ug | ORAL_TABLET | Freq: Every day | ORAL | 1 refills | Status: DC

## 2023-04-12 NOTE — Telephone Encounter (Signed)
Patient called stating that she saw here results on MyChart and states that she has been taking Levothyroxine 125 and not 150. Please advise,

## 2023-04-12 NOTE — Telephone Encounter (Signed)
LMTRC  JMiller,RMA 

## 2023-04-17 ENCOUNTER — Ambulatory Visit: Payer: BC Managed Care – PPO | Admitting: Family Medicine

## 2023-05-10 ENCOUNTER — Ambulatory Visit: Payer: BC Managed Care – PPO | Admitting: Family Medicine

## 2023-05-24 ENCOUNTER — Ambulatory Visit: Payer: BC Managed Care – PPO | Admitting: Family Medicine

## 2023-05-24 ENCOUNTER — Encounter: Payer: Self-pay | Admitting: Family Medicine

## 2023-05-24 DIAGNOSIS — Z7985 Long-term (current) use of injectable non-insulin antidiabetic drugs: Secondary | ICD-10-CM | POA: Diagnosis not present

## 2023-05-24 DIAGNOSIS — E1169 Type 2 diabetes mellitus with other specified complication: Secondary | ICD-10-CM | POA: Diagnosis not present

## 2023-05-24 DIAGNOSIS — E559 Vitamin D deficiency, unspecified: Secondary | ICD-10-CM

## 2023-05-24 DIAGNOSIS — G4733 Obstructive sleep apnea (adult) (pediatric): Secondary | ICD-10-CM | POA: Diagnosis not present

## 2023-05-24 DIAGNOSIS — E7800 Pure hypercholesterolemia, unspecified: Secondary | ICD-10-CM

## 2023-05-24 DIAGNOSIS — I10 Essential (primary) hypertension: Secondary | ICD-10-CM

## 2023-05-24 DIAGNOSIS — Z6841 Body Mass Index (BMI) 40.0 and over, adult: Secondary | ICD-10-CM

## 2023-05-24 LAB — COMPREHENSIVE METABOLIC PANEL
ALT: 16 U/L (ref 0–35)
AST: 18 U/L (ref 0–37)
Albumin: 3.9 g/dL (ref 3.5–5.2)
Alkaline Phosphatase: 75 U/L (ref 39–117)
BUN: 11 mg/dL (ref 6–23)
CO2: 32 mEq/L (ref 19–32)
Calcium: 9.3 mg/dL (ref 8.4–10.5)
Chloride: 99 mEq/L (ref 96–112)
Creatinine, Ser: 1.11 mg/dL (ref 0.40–1.20)
GFR: 58.22 mL/min — ABNORMAL LOW (ref >60.00)
Glucose, Bld: 100 mg/dL — ABNORMAL HIGH (ref 70–99)
Potassium: 3.5 mEq/L (ref 3.5–5.1)
Sodium: 138 mEq/L (ref 135–145)
Total Bilirubin: 0.3 mg/dL (ref 0.2–1.2)
Total Protein: 6.8 g/dL (ref 6.0–8.3)

## 2023-05-24 LAB — CBC WITH DIFFERENTIAL/PLATELET
Basophils Absolute: 0.1 10*3/uL (ref 0.0–0.1)
Basophils Relative: 0.7 % (ref 0.0–3.0)
Eosinophils Absolute: 0.2 10*3/uL (ref 0.0–0.7)
Eosinophils Relative: 2.5 % (ref 0.0–5.0)
HCT: 37 % (ref 36.0–46.0)
Hemoglobin: 12.2 g/dL (ref 12.0–15.0)
Lymphocytes Relative: 30.6 % (ref 12.0–46.0)
Lymphs Abs: 2.4 10*3/uL (ref 0.7–4.0)
MCHC: 33 g/dL (ref 30.0–36.0)
MCV: 87.3 fl (ref 78.0–100.0)
Monocytes Absolute: 0.3 10*3/uL (ref 0.1–1.0)
Monocytes Relative: 4.5 % (ref 3.0–12.0)
Neutro Abs: 4.7 10*3/uL (ref 1.4–7.7)
Neutrophils Relative %: 61.7 % (ref 43.0–77.0)
Platelets: 238 10*3/uL (ref 150.0–400.0)
RBC: 4.24 Mil/uL (ref 3.87–5.11)
RDW: 15.6 % — ABNORMAL HIGH (ref 11.5–15.5)
WBC: 7.7 10*3/uL (ref 4.0–10.5)

## 2023-05-24 LAB — HEMOGLOBIN A1C: Hgb A1c MFr Bld: 6.1 % (ref 4.6–6.5)

## 2023-05-24 LAB — VITAMIN D 25 HYDROXY (VIT D DEFICIENCY, FRACTURES): VITD: 22.85 ng/mL — ABNORMAL LOW (ref 30.00–100.00)

## 2023-05-24 MED ORDER — TIRZEPATIDE 2.5 MG/0.5ML ~~LOC~~ SOAJ: 2.5000 mg | SUBCUTANEOUS | 2 refills | Status: DC

## 2023-05-24 NOTE — Progress Notes (Unsigned)
Subjective:     Patient ID: Kim Tanner, female    DOB: 10-30-1970, 52 y.o.   MRN: [id number]  Chief Complaint  Patient presents with   Follow-up    CPAP used for a couple weeks but started working at night so threw her off and stopped using it   Obesity    Wants to discuss plan for weight loss    HPI  Discussed the use of AI scribe software for clinical note transcription with the patient, who gave verbal consent to proceed.  History of Present Illness         Here for follow up on chronic health conditions including HTN and DM.  Concerned about weight and would like to try medication.   OSA- used CPAP for 2 weeks. States she had issues with the machine and sleeping so she stopped using it.      Health Maintenance Due  Topic Date Due   FOOT EXAM  Never done   OPHTHALMOLOGY EXAM  Never done   HIV Screening  Never done   COVID-19 Vaccine (3 - Pfizer risk series) 01/16/2020   PAP SMEAR-Modifier  12/30/2020   INFLUENZA VACCINE  05/02/2023    Past Medical History:  Diagnosis Date   Anxiety    Asthma    Back pain    Dyspnea    HTN (hypertension)    Joint pain    Lower extremity edema    Metabolic syndrome    Morbid obesity (HCC)    Prediabetes    Sleep apnea    Uncontrolled hypertension 09/16/2018    Past Surgical History:  Procedure Laterality Date   BREAST REDUCTION SURGERY  2008   CESAREAN SECTION     [place]     [place]normal exam about 9 years ago-no records per pt   DILATION AND CURETTAGE OF UTERUS     INDUCED ABORTION     REDUCTION MAMMAPLASTY     uterine ablation      Family History  Problem Relation Age of Onset   Hypertension Mother    Obesity Mother    Hypertension Father    Diabetes Father    Diabetes Sister    Stroke Sister 39   Breast cancer Neg Hx    Colon cancer Neg Hx    Rectal cancer Neg Hx    Stomach cancer Neg Hx    Esophageal cancer Neg Hx    Colon polyps Neg Hx     Social History   Socioeconomic History    Marital status: Single    Spouse name: Not on file   Number of children: 2   Years of education: Not on file   Highest education level: Bachelor's degree (e.g., BA, AB, BS)  Occupational History   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    Employer: [employer]  Tobacco Use   Smoking status: Never   Smokeless tobacco: Never  Vaping Use   Vaping status: Never Used  Substance and Sexual Activity   Alcohol use: Yes    Alcohol/week: 1.0 - 2.0 standard drink of alcohol    Types: 1 - 2 Glasses of wine per week    Comment: socially    Drug use: No   Sexual activity: Not Currently    Birth control/protection: Condom  Other Topics Concern   Not on file  Social History Narrative   Not on file   Social Determinants of Health   Financial Resource Strain: Low Risk  (01/21/2023)   Overall Financial  Resource Strain (CARDIA)    Difficulty of Paying Living Expenses: Not hard at all  Food Insecurity: No Food Insecurity (01/21/2023)   Hunger Vital Sign    Worried About Running Out of Food in the Last Year: Never true    Ran Out of Food in the Last Year: Never true  Transportation Needs: No Transportation Needs (01/21/2023)   PRAPARE - Administrator, Civil Service (Medical): No    Lack of Transportation (Non-Medical): No  Physical Activity: Insufficiently Active (01/21/2023)   Exercise Vital Sign    Days of Exercise per Week: 2 days    Minutes of Exercise per Session: 20 min  Stress: No Stress Concern Present (01/21/2023)   [employer] of Occupational Health - Occupational Stress Questionnaire    Feeling of Stress : Only a little  Social Connections: Moderately Isolated (01/21/2023)   Social Connection and Isolation Panel [NHANES]    Frequency of Communication with Friends and Family: More than three times a week    Frequency of Social Gatherings with Friends and Family: Once a week    Attends Religious Services: More than 4 times per year    Active Member of [employer]  or Organizations: No    Attends Engineer, structural: Not on file    Marital Status: Never married  Intimate Partner Violence: Not on file    Outpatient Medications Prior to Visit  Medication Sig Dispense Refill   albuterol (PROVENTIL HFA;VENTOLIN HFA) 108 (90 Base) MCG/ACT inhaler Inhale 2 puffs into the lungs every 6 (six) hours as needed for wheezing or shortness of breath. 1 Inhaler 1   hydrochlorothiazide (HYDRODIURIL) 25 MG tablet Take 1 tablet (25 mg total) by mouth daily. 90 tablet 1   levothyroxine (SYNTHROID) 125 MCG tablet Take 1 tablet (125 mcg total) by mouth daily before breakfast. 90 tablet 1   losartan (COZAAR) 100 MG tablet Take 1 tablet (100 mg total) by mouth daily. 90 tablet 0   meclizine (ANTIVERT) 25 MG tablet TAKE 1 TABLET(25 MG) BY MOUTH TWICE DAILY AS NEEDED FOR DIZZINESS (Patient taking differently: Take 25 mg by mouth 2 (two) times daily as needed for dizziness.) 30 tablet 1   rosuvastatin (CRESTOR) 10 MG tablet TAKE 1 TABLET(10 MG) BY MOUTH DAILY (Patient taking differently: Take 10 mg by mouth daily.) 90 tablet 1   Cholecalciferol (VITAMIN D) 50 MCG (2000 UT) CAPS Take 1 capsule (2,000 Units total) by mouth daily. 90 capsule 3   No facility-administered medications prior to visit.    Allergies  Allergen Reactions   Compazine [Prochlorperazine Edisylate]     Jaw locked up    Review of Systems  Constitutional:  Negative for chills, fever and weight loss.  Respiratory:  Negative for shortness of breath.   Cardiovascular:  Negative for chest pain, palpitations and leg swelling.  Gastrointestinal:  Negative for abdominal pain, constipation, diarrhea, nausea and vomiting.  Genitourinary:  Negative for dysuria, frequency and urgency.  Neurological:  Negative for dizziness.       Objective:    Physical Exam Constitutional:      General: She is not in acute distress.    Appearance: She is not ill-appearing.  Eyes:     Extraocular Movements:  Extraocular movements intact.     Conjunctiva/sclera: Conjunctivae normal.  Cardiovascular:     Rate and Rhythm: Normal rate.  Pulmonary:     Effort: Pulmonary effort is normal.  Musculoskeletal:     Cervical back: Normal range of motion  and neck supple.     Right lower leg: No edema.     Left lower leg: No edema.  Skin:    General: Skin is warm and dry.  Neurological:     General: No focal deficit present.     Mental Status: She is alert and oriented to person, place, and time.  Psychiatric:        Mood and Affect: Mood normal.        Behavior: Behavior normal.        Thought Content: Thought content normal.      BP (!) 130/92 (BP Location: Left Arm, Patient Position: Sitting, Cuff Size: Large)   Pulse 67   Temp 97.8 F (36.6 C) (Temporal)   Ht 5\' 1"  (1.549 m)   Wt 261 lb (118.4 kg)   SpO2 98%   BMI 49.32 kg/m  Wt Readings from Last 3 Encounters:  05/24/23 261 lb (118.4 kg)  03/28/23 273 lb (123.8 kg)  01/22/23 265 lb (120.2 kg)       Assessment & Plan:   Problem List Items Addressed This Visit       Cardiovascular and Mediastinum   Uncontrolled hypertension    Not controlled. Recommend consistency with medications. Encouraged to restart CPAP. Add Mounjaro. Monitor at home.  Check renal function. Follow up in 3 mths        Respiratory   OSA (obstructive sleep apnea)    Sleep test shows moderate OSA. Encourage her to use CPAP consistently.         Endocrine   Type 2 diabetes mellitus with morbid obesity (HCC) - Primary    Controlled. Mounjaro prescribed. Recommend annual eye exams. Continue losartan and statin.       Relevant Medications   tirzepatide (MOUNJARO) 2.5 MG/0.5ML Pen   Other Relevant Orders   CBC with Differential/Platelet (Completed)   Comprehensive metabolic panel (Completed)   Hemoglobin A1c (Completed)     Other   Pure hypercholesterolemia    Continue statin      Vitamin D deficiency    Check vitamin D level and follow up.        Relevant Orders   VITAMIN D 25 Hydroxy (Vit-D Deficiency, Fractures) (Completed)    I have discontinued Krystn Defino's Vitamin D. I am also having her start on tirzepatide. Additionally, I am having her maintain her albuterol, meclizine, rosuvastatin, hydrochlorothiazide, losartan, and levothyroxine.  Meds ordered this encounter  Medications   tirzepatide (MOUNJARO) 2.5 MG/0.5ML Pen    Sig: Inject 2.5 mg into the skin once a week.    Dispense:  2 mL    Refill:  2    Order Specific Question:   Supervising Provider    Answer:   Hillard Danker A [[id number]]

## 2023-05-24 NOTE — Patient Instructions (Signed)
Please go downstairs for labs.    

## 2023-05-27 NOTE — Assessment & Plan Note (Addendum)
Not controlled. Recommend consistency with medications. Encouraged to restart CPAP. Add Mounjaro. Monitor at home.  Check renal function. Follow up in 3 mths

## 2023-05-27 NOTE — Assessment & Plan Note (Signed)
Sleep test shows moderate OSA. Encourage her to use CPAP consistently.

## 2023-05-27 NOTE — Assessment & Plan Note (Signed)
Continue statin. 

## 2023-05-27 NOTE — Assessment & Plan Note (Signed)
Check vitamin D level and follow up 

## 2023-05-27 NOTE — Assessment & Plan Note (Signed)
Controlled. Mounjaro prescribed. Recommend annual eye exams. Continue losartan and statin.

## 2023-06-04 ENCOUNTER — Telehealth: Payer: Self-pay | Admitting: Family Medicine

## 2023-06-04 NOTE — Telephone Encounter (Signed)
Patient needs prior authorization started for tirzepatide [facility]) 2.5 MG/0.5ML Pen. Best callback is [phone].

## 2023-06-05 ENCOUNTER — Telehealth: Payer: Self-pay | Admitting: Family Medicine

## 2023-06-05 ENCOUNTER — Telehealth: Payer: Self-pay

## 2023-06-05 ENCOUNTER — Other Ambulatory Visit (HOSPITAL_COMMUNITY): Payer: Self-pay

## 2023-06-05 NOTE — Telephone Encounter (Signed)
Patient is waiting on a prior authorization for [facility]. A message has already been sent to them, but she would like to know if the office has any samples. She would like to start the medication as soon as she can. Best callback is [phone].

## 2023-06-05 NOTE — Telephone Encounter (Signed)
Pharmacy Patient Advocate Encounter   Received notification from Pt Calls Messages that prior authorization for Mounjaro 2.5mg /0.63ml is required/requested.   Insurance verification completed.   The patient is insured through [facility] .   Per test claim: PA required; PA submitted to [employer] via CoverMyMeds Key/confirmation #/EOC [id number] Status is pending

## 2023-06-05 NOTE — Telephone Encounter (Signed)
Called pt and informed we do not have samples but PA is still pending and will let her know when determination is reached.

## 2023-06-06 ENCOUNTER — Other Ambulatory Visit (HOSPITAL_COMMUNITY): Payer: Self-pay

## 2023-06-06 NOTE — Telephone Encounter (Signed)
LM for pt informing of approval and should be available for pick up at pharmacy. Advised to please let us know if she has any issues.

## 2023-06-06 NOTE — Telephone Encounter (Signed)
Pharmacy Patient Advocate Encounter  Received notification from [facility] that Prior Authorization for [facility] 2.5mg /0.17ml has been APPROVED from 06/05/23 to 06/03/26   PA #/Case ID/Reference #:  [id number]

## 2023-07-04 ENCOUNTER — Ambulatory Visit: Payer: BC Managed Care – PPO | Admitting: Internal Medicine

## 2023-08-02 ENCOUNTER — Ambulatory Visit: Payer: BC Managed Care – PPO | Admitting: Internal Medicine

## 2023-08-02 NOTE — Progress Notes (Deleted)
Patient ID: Kim Tanner, female   DOB: [birth year], 52 y.o.   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  Last visit 4 months ago.  Interim history: At last visit she had fatigue, weight gain, fluid retention.  This is improved.  She lost 12 pounds since last visit after starting Mounjaro.  Reviewed and addended history: Patient was diagnosed with thyrotoxicosis in 11/2016.  She was referred to endocrinology then, but could not come at that time, and only got to see me for the first time in 11/2018.  At last visit, her TFTs were still thyrotoxic and her TSI antibodies were elevated, confirming Graves' disease.  We started methimazole 5 mg twice a day, but she did not come back for labs 1.5 months later...  She had another set of TFTs by PCP in 05/2019 and these appeared worse.    She tells me that before her labs in 05/2019, she was missing 2nd dose of MMI at the day.  We contacted her to start taking the medication consistently.  At last visit she was taking 5 mg twice a day.  Since then, we checked a thyroid uptake and scan (07/28/2019) and this was positive for Graves' disease: 4 hour I-123 uptake = 39%% (normal 5-20%) 24 hour I-123 uptake = 60%% (normal 10-30%)   IMPRESSION: Elevated 4 hour and 24 hour radio iodine uptakes consistent with hyperthyroidism. No focal scintigraphic abnormalities on imaging. Findings consistent with Graves disease.  She had RAI treatment (08/31/2019).  She felt well after RAI treatment, without complaints, however, she developed post ablation hypothyroidism in 12/2019.  We started levothyroxine 50 mcg daily and advised her to come back in 5 to 6 weeks for labs but she did not return. She saw PCP 7 months later and at that time TSH was still extremely high, at 74.8.  We  subsequently increased the levothyroxine.  She was on 125 mcg LT4 but the dose was then increased by PCP in 07/2022 to 150 mcg daily.  At that time, she mentioned that she was off levothyroxine for almost 2 weeks!  At our visit from 03/2023, she ran out of the 150 mcg daily and was on 125 mcg daily for the previous 2 weeks and was previously off completely for approximately 3 days.  We continued 125 mcg daily, as TSH was normal.  She takes levothyroxine: - in am - fasting - still keeps it in the kitchen - coffee + cream + sugar > 30 min later - no calcium - no iron - + multivitamins at night - no PPIs - off B complex (30 mcg biotin), on B12 2500 mcg daiy - on vitamin C daily  Reviewed her TFTs: Lab Results  Component Value Date   TSH 3.12 03/28/2023   TSH 50.200 (H) 07/12/2022   TSH 4.650 (H) 11/07/2021   TSH 22.27 (H) 04/13/2021   TSH 74.800 (H) 08/24/2020   TSH 90.54 Repeated and verified X2. (H) 01/21/2020   TSH <0.005 (Tanner) 11/11/2019   TSH <0.01 (Tanner) 07/14/2019   TSH <0.005 (Tanner) 05/11/2019   TSH <0.01 (Tanner) 12/19/2018   FREET4 1.07 03/28/2023   FREET4 1.09 07/12/2022   FREET4 0.92 04/13/2021   FREET4 0.57 (Tanner) 08/24/2020   FREET4 0.19 (Tanner) 01/21/2020   FREET4 1.09 11/11/2019   FREET4 1.34 07/14/2019   FREET4 2.37 (H) 05/11/2019  FREET4 1.53 12/19/2018   FREET4 2.14 (H) 11/18/2018   T3FREE 2.0 (Tanner) 01/21/2020   T3FREE 3.8 07/14/2019   T3FREE 5.3 (H) 12/19/2018   T3FREE 3.8 12/26/2016   Her TSI's were elevated confirming Graves' disease: Lab Results  Component Value Date   TSI 386 (H) 12/19/2018    Thyroid U/S (01/21/2017): Moderately heterogeneous thyroid of normal size, with a 1 cm nodule  Pt denies: - feeling nodules in neck - hoarseness - dysphagia - choking  No FH of thyroid cancer. No h/o radiation tx to head or neck except RAI treatment.. No recent contrast studies. No herbal supplements. No recent steroids use.   She also has a history of  anxiety. She saw Dr. Rinaldo Tanner in the weight management clinic. [facility] She tried Phentermine >> felt poorly >> stopped.  She has prediabetes and was started on Metformin by Dr. Dalbert Tanner 11/2018-then came off, but now back on metformin (ER) per PCP. Kim Tanner was recently added.  Reviewed HbA1c levels: Lab Results  Component Value Date   HGBA1C 6.1 05/24/2023   HGBA1C 6.1 01/22/2023   HGBA1C 6.6 (H) 07/12/2022   She has a history of HTN-on HCTZ   She also has hyperlipidemia and lipids improved after starting Crestor: Lab Results  Component Value Date   CHOL 183 01/22/2023   HDL 42.60 01/22/2023   LDLCALC 124 (H) 01/22/2023   TRIG 83.0 01/22/2023   CHOLHDL 4 01/22/2023   ROS: + See HPI  Past Medical History:  Diagnosis Date   Anxiety    Asthma    Back pain    Dyspnea    HTN (hypertension)    Joint pain    Lower extremity edema    Metabolic syndrome    Morbid obesity (HCC)    Prediabetes    Sleep apnea    Uncontrolled hypertension 09/16/2018   Past Surgical History:  Procedure Laterality Date   BREAST REDUCTION SURGERY  2008   CESAREAN SECTION     [place]OSCOPY     in [place]-normal exam about 9 years ago-no records per pt   DILATION AND CURETTAGE OF UTERUS     INDUCED ABORTION     REDUCTION MAMMAPLASTY     uterine ablation     Social History   Socioeconomic History   Marital status: Single    Spouse name: Not on file   Number of children: 2   Years of education: Not on file   Highest education level: Bachelor's degree (e.g., BA, AB, BS)  Occupational History   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    Employer: [employer]  Tobacco Use   Smoking status: Never   Smokeless tobacco: Never  Vaping Use   Vaping status: Never Used  Substance and Sexual Activity   Alcohol use: Yes    Alcohol/week: 1.0 - 2.0 standard drink of alcohol    Types: 1 - 2 Glasses of wine per week    Comment: socially    Drug use: No   Sexual  activity: Not Currently    Birth control/protection: Condom  Other Topics Concern   Not on file  Social History Narrative   Not on file   Social Determinants of Health   Financial Resource Strain: Low Risk  (01/21/2023)   Overall Financial Resource Strain (CARDIA)    Difficulty of Paying Living Expenses: Not hard at all  Food Insecurity: No Food Insecurity (01/21/2023)   Hunger Vital Sign    Worried About Running Out  of Food in the Last Year: Never true    Ran Out of Food in the Last Year: Never true  Transportation Needs: No Transportation Needs (01/21/2023)   PRAPARE - Administrator, Civil Service (Medical): No    Lack of Transportation (Non-Medical): No  Physical Activity: Insufficiently Active (01/21/2023)   Exercise Vital Sign    Days of Exercise per Week: 2 days    Minutes of Exercise per Session: 20 min  Stress: No Stress Concern Present (01/21/2023)   [employer] of Occupational Health - Occupational Stress Questionnaire    Feeling of Stress : Only a little  Social Connections: Moderately Isolated (01/21/2023)   Social Connection and Isolation Panel [NHANES]    Frequency of Communication with Friends and Family: More than three times a week    Frequency of Social Gatherings with Friends and Family: Once a week    Attends Religious Services: More than 4 times per year    Active Member of [employer] or Organizations: No    Attends Engineer, structural: Not on file    Marital Status: Never married  Intimate Partner Violence: Not on file   Current Outpatient Medications on File Prior to Visit  Medication Sig Dispense Refill   albuterol (PROVENTIL HFA;VENTOLIN HFA) 108 (90 Base) MCG/ACT inhaler Inhale 2 puffs into the lungs every 6 (six) hours as needed for wheezing or shortness of breath. 1 Inhaler 1   hydrochlorothiazide (HYDRODIURIL) 25 MG tablet Take 1 tablet (25 mg total) by mouth daily. 90 tablet 1   levothyroxine (SYNTHROID) 125 MCG tablet Take 1  tablet (125 mcg total) by mouth daily before breakfast. 90 tablet 1   losartan (COZAAR) 100 MG tablet Take 1 tablet (100 mg total) by mouth daily. 90 tablet 0   meclizine (ANTIVERT) 25 MG tablet TAKE 1 TABLET(25 MG) BY MOUTH TWICE DAILY AS NEEDED FOR DIZZINESS (Patient taking differently: Take 25 mg by mouth 2 (two) times daily as needed for dizziness.) 30 tablet 1   rosuvastatin (CRESTOR) 10 MG tablet TAKE 1 TABLET(10 MG) BY MOUTH DAILY (Patient taking differently: Take 10 mg by mouth daily.) 90 tablet 1   tirzepatide (MOUNJARO) 2.5 MG/0.5ML Pen Inject 2.5 mg into the skin once a week. 2 mL 2   No current facility-administered medications on file prior to visit.   Allergies  Allergen Reactions   Compazine [Prochlorperazine Edisylate]     Jaw locked up   Family History  Problem Relation Age of Onset   Hypertension Mother    Obesity Mother    Hypertension Father    Diabetes Father    Diabetes Sister    Stroke Sister 25   Breast cancer Neg Hx    Colon cancer Neg Hx    Rectal cancer Neg Hx    Stomach cancer Neg Hx    Esophageal cancer Neg Hx    Colon polyps Neg Hx    PE: There were no vitals taken for this visit. Wt Readings from Last 10 Encounters:  05/24/23 261 lb (118.4 kg)  03/28/23 273 lb (123.8 kg)  01/22/23 265 lb (120.2 kg)  12/25/22 250 lb (113.4 kg)  10/18/22 250 lb (113.4 kg)  09/21/22 273 lb 9.6 oz (124.1 kg)  09/19/22 273 lb (123.8 kg)  07/12/22 281 lb 6.4 oz (127.6 kg)  02/28/22 279 lb 6.4 oz (126.7 kg)  12/08/21 266 lb (120.7 kg)   Constitutional: overweight, in NAD Eyes: EOMI, no exophthalmos ENT: + very slight symmetric thyromegaly, no  cervical lymphadenopathy Cardiovascular: RRR, No MRG Respiratory: CTA B Musculoskeletal: no deformities Skin: no rashes except acanthosis nigricans on neck, also skin tags on neck Neurological: no tremor with outstretched hands  ASSESSMENT: 1.  Graves' disease - Now status post RAI treatment  2.  Right thyroid  nodule  3.  Prediabetes - managed by PCP - on Mounjaro, Metformin  PLAN:  1. Patient with long history of untreated Graves' disease since at least 2018, without thyrotoxic symptoms.  We tried methimazole but she was not very compliant with the medication and labs and TFTs remained abnormal.  She finally had RAI treatment in 08/2019.  TSH was suppressed initially after RAI treatment but it increased significantly afterwards, up to 90.  Restarted levothyroxine at a low dose, 50 mcg daily, but she did not return for repeat labs.  When we finally check them again, TSH was 75.  We increased her levothyroxine afterwards, up to 150 mcg daily but she was then lost for follow-up, and she returned after an absence of 1.5 years and the TSH was still high, at 50.  She also gained 9 pounds.  We discussed at that time about the importance of not missing doses. -At last visit, she mentions that she ran out of the 150 mcg levothyroxine dose and was using a lower dose, 125, which she still had at home.  She was also off the medication for 3 days in a row.  We again discussed at last visit about the importance of taking the medication consistently, but the TSH was normal so I advised her to continue with the 125 mcg daily: Lab Results  Component Value Date   TSH 3.12 03/28/2023  - she continues on LT4 125 mcg daily - pt feels good on this dose. - we discussed about taking the thyroid hormone every day, with water, >30 minutes before breakfast, separated by >4 hours from acid reflux medications, calcium, iron, multivitamins. Pt. is taking it correctly. - will check thyroid tests today: TSH and fT4 - If labs are abnormal, she will need to return for repeat TFTs in 1.5 months - OTW, I will see her back in 6 months  2.  Right thyroid nodule -Measuring 1 cm for the thyroid ultrasound from 2018 -No neck compression symptoms -The nodule does not appear to be more of an.  We discussed that we can continue to follow this  expectantly.  Carlus Pavlov, MD PhD [facility]ocr[facility]

## 2023-09-13 ENCOUNTER — Ambulatory Visit: Payer: BC Managed Care – PPO | Admitting: Family Medicine

## 2023-09-13 ENCOUNTER — Encounter: Payer: Self-pay | Admitting: Family Medicine

## 2023-09-13 DIAGNOSIS — M25562 Pain in left knee: Secondary | ICD-10-CM | POA: Diagnosis not present

## 2023-09-13 DIAGNOSIS — G4733 Obstructive sleep apnea (adult) (pediatric): Secondary | ICD-10-CM | POA: Diagnosis not present

## 2023-09-13 DIAGNOSIS — E7800 Pure hypercholesterolemia, unspecified: Secondary | ICD-10-CM

## 2023-09-13 DIAGNOSIS — Z7985 Long-term (current) use of injectable non-insulin antidiabetic drugs: Secondary | ICD-10-CM

## 2023-09-13 DIAGNOSIS — I10 Essential (primary) hypertension: Secondary | ICD-10-CM | POA: Diagnosis not present

## 2023-09-13 DIAGNOSIS — E890 Postprocedural hypothyroidism: Secondary | ICD-10-CM

## 2023-09-13 DIAGNOSIS — E1169 Type 2 diabetes mellitus with other specified complication: Secondary | ICD-10-CM

## 2023-09-13 DIAGNOSIS — E559 Vitamin D deficiency, unspecified: Secondary | ICD-10-CM

## 2023-09-13 DIAGNOSIS — J4530 Mild persistent asthma, uncomplicated: Secondary | ICD-10-CM

## 2023-09-13 DIAGNOSIS — E0500 Thyrotoxicosis with diffuse goiter without thyrotoxic crisis or storm: Secondary | ICD-10-CM

## 2023-09-13 LAB — CBC WITH DIFFERENTIAL/PLATELET
Basophils Absolute: 0 10*3/uL (ref 0.0–0.1)
Basophils Relative: 0.8 % (ref 0.0–3.0)
Eosinophils Absolute: 0.2 10*3/uL (ref 0.0–0.7)
Eosinophils Relative: 2.8 % (ref 0.0–5.0)
HCT: 36.3 % (ref 36.0–46.0)
Hemoglobin: 12 g/dL (ref 12.0–15.0)
Lymphocytes Relative: 29.7 % (ref 12.0–46.0)
Lymphs Abs: 1.9 10*3/uL (ref 0.7–4.0)
MCHC: 33.1 g/dL (ref 30.0–36.0)
MCV: 89.9 fL (ref 78.0–100.0)
Monocytes Absolute: 0.3 10*3/uL (ref 0.1–1.0)
Monocytes Relative: 4.7 % (ref 3.0–12.0)
Neutro Abs: 3.9 10*3/uL (ref 1.4–7.7)
Neutrophils Relative %: 62 % (ref 43.0–77.0)
Platelets: 222 10*3/uL (ref 150.0–400.0)
RBC: 4.04 Mil/uL (ref 3.87–5.11)
RDW: 14.2 % (ref 11.5–15.5)
WBC: 6.3 10*3/uL (ref 4.0–10.5)

## 2023-09-13 LAB — LIPID PANEL
Cholesterol: 127 mg/dL (ref 0–200)
HDL: 43.9 mg/dL (ref >39.00)
LDL Cholesterol: 71 mg/dL (ref 0–99)
NonHDL: 83.55
Total CHOL/HDL Ratio: 3
Triglycerides: 63 mg/dL (ref 0.0–149.0)
VLDL: 12.6 mg/dL (ref 0.0–40.0)

## 2023-09-13 LAB — COMPREHENSIVE METABOLIC PANEL
ALT: 18 U/L (ref 0–35)
AST: 19 U/L (ref 0–37)
Albumin: 4.4 g/dL (ref 3.5–5.2)
Alkaline Phosphatase: 80 U/L (ref 39–117)
BUN: 14 mg/dL (ref 6–23)
CO2: 32 meq/L (ref 19–32)
Calcium: 9.6 mg/dL (ref 8.4–10.5)
Chloride: 102 meq/L (ref 96–112)
Creatinine, Ser: 1.2 mg/dL (ref 0.40–1.20)
GFR: 52.91 mL/min — ABNORMAL LOW (ref >60.00)
Glucose, Bld: 102 mg/dL — ABNORMAL HIGH (ref 70–99)
Potassium: 3.5 meq/L (ref 3.5–5.1)
Sodium: 142 meq/L (ref 135–145)
Total Bilirubin: 0.5 mg/dL (ref 0.2–1.2)
Total Protein: 7.3 g/dL (ref 6.0–8.3)

## 2023-09-13 LAB — VITAMIN D 25 HYDROXY (VIT D DEFICIENCY, FRACTURES): VITD: 23.15 ng/mL — ABNORMAL LOW (ref 30.00–100.00)

## 2023-09-13 LAB — HEMOGLOBIN A1C: Hgb A1c MFr Bld: 6 % (ref 4.6–6.5)

## 2023-09-13 MED ORDER — TIRZEPATIDE 2.5 MG/0.5ML ~~LOC~~ SOAJ: 2.5000 mg | SUBCUTANEOUS | 1 refills | Status: DC

## 2023-09-13 NOTE — Patient Instructions (Addendum)
Please go downstairs for labs.   Call and schedule with [facility] for your knee.   Call [facility] to get a different CPAP that you are comfortable using.   Schedule with Dr. Elvera Lennox  Schedule an eye exam

## 2023-09-13 NOTE — Progress Notes (Unsigned)
Subjective:     Patient ID: Kim Tanner, female    DOB: 11-01-1970, 52 y.o.   MRN: [id number]  Chief Complaint  Patient presents with   Follow-up    F/u on mounjaro   Knee Pain    L knee pain x3 months, mostly when standing up on it    HPI   History of Present Illness         Here to follow up on chronic health conditions and for new c/o left knee pain and swelling x 3 months. Pain is worse laterally.   Denies injury. Using [employer]. Taking Advil and Tylenol or Aleve.  Aches at night and worse when she turns onto her left side in the pain.  No falls. Popping and feels unstable.   OB/GYN-  Endocrinologist-  Eyes-    HTN- reports good compliance with medications. Does not check BP at home.  She does not use CPAP  Decreased GFR  OSA- moderate  [facility]   Type 2 DM with morbid obesity- last A1c 6.1% Mounjaro - none in the past 4 weeks. She would like to restart. She was eating healthier and losing weight on the medication.   Gained 5 lbs since visit in August.   Taking vitamin D supplement.   Taking care of her mom.  She also has a 41 year old.     Health Maintenance Due  Topic Date Due   FOOT EXAM  Never done   OPHTHALMOLOGY EXAM  Never done   HIV Screening  Never done   Zoster Vaccines- Shingrix (1 of 2) Never done   Cervical Cancer Screening (HPV/Pap Cotest)  12/30/2020   MAMMOGRAM  07/11/2023    Past Medical History:  Diagnosis Date   Anxiety    Asthma    Back pain    Dyspnea    HTN (hypertension)    Joint pain    Lower extremity edema    Metabolic syndrome    Morbid obesity (HCC)    Prediabetes    Sleep apnea    Uncontrolled hypertension 09/16/2018    Past Surgical History:  Procedure Laterality Date   BREAST REDUCTION SURGERY  2008   CESAREAN SECTION     [place]     in [place]normal exam about 9 years ago-no records per pt   DILATION AND CURETTAGE OF UTERUS     INDUCED ABORTION     REDUCTION MAMMAPLASTY     uterine  ablation      Family History  Problem Relation Age of Onset   Hypertension Mother    Obesity Mother    Hypertension Father    Diabetes Father    Diabetes Sister    Stroke Sister 31   Breast cancer Neg Hx    Colon cancer Neg Hx    Rectal cancer Neg Hx    Stomach cancer Neg Hx    Esophageal cancer Neg Hx    Colon polyps Neg Hx     Social History   Socioeconomic History   Marital status: Single    Spouse name: Not on file   Number of children: 2   Years of education: Not on file   Highest education level: Bachelor's degree (e.g., BA, AB, BS)  Occupational History   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    Employer: [employer]  Tobacco Use   Smoking status: Never   Smokeless tobacco: Never  Vaping Use   Vaping status: Never Used  Substance and Sexual Activity  Alcohol use: Yes    Alcohol/week: 1.0 - 2.0 standard drink of alcohol    Types: 1 - 2 Glasses of wine per week    Comment: socially    Drug use: No   Sexual activity: Not Currently    Birth control/protection: Condom  Other Topics Concern   Not on file  Social History Narrative   Not on file   Social Drivers of Health   Financial Resource Strain: Low Risk  (01/21/2023)   Overall Financial Resource Strain (CARDIA)    Difficulty of Paying Living Expenses: Not hard at all  Food Insecurity: No Food Insecurity (01/21/2023)   Hunger Vital Sign    Worried About Running Out of Food in the Last Year: Never true    Ran Out of Food in the Last Year: Never true  Transportation Needs: No Transportation Needs (01/21/2023)   PRAPARE - Administrator, Civil Service (Medical): No    Lack of Transportation (Non-Medical): No  Physical Activity: Insufficiently Active (01/21/2023)   Exercise Vital Sign    Days of Exercise per Week: 2 days    Minutes of Exercise per Session: 20 min  Stress: No Stress Concern Present (01/21/2023)   [employer] of Occupational Health - Occupational Stress  Questionnaire    Feeling of Stress : Only a little  Social Connections: Moderately Isolated (01/21/2023)   Social Connection and Isolation Panel [NHANES]    Frequency of Communication with Friends and Family: More than three times a week    Frequency of Social Gatherings with Friends and Family: Once a week    Attends Religious Services: More than 4 times per year    Active Member of [employer] or Organizations: No    Attends Engineer, structural: Not on file    Marital Status: Never married  Intimate Partner Violence: Not on file    Outpatient Medications Prior to Visit  Medication Sig Dispense Refill   albuterol (PROVENTIL HFA;VENTOLIN HFA) 108 (90 Base) MCG/ACT inhaler Inhale 2 puffs into the lungs every 6 (six) hours as needed for wheezing or shortness of breath. 1 Inhaler 1   hydrochlorothiazide (HYDRODIURIL) 25 MG tablet Take 1 tablet (25 mg total) by mouth daily. 90 tablet 1   levothyroxine (SYNTHROID) 125 MCG tablet Take 1 tablet (125 mcg total) by mouth daily before breakfast. 90 tablet 1   losartan (COZAAR) 100 MG tablet Take 1 tablet (100 mg total) by mouth daily. 90 tablet 0   meclizine (ANTIVERT) 25 MG tablet TAKE 1 TABLET(25 MG) BY MOUTH TWICE DAILY AS NEEDED FOR DIZZINESS (Patient taking differently: Take 25 mg by mouth 2 (two) times daily as needed for dizziness.) 30 tablet 1   rosuvastatin (CRESTOR) 10 MG tablet TAKE 1 TABLET(10 MG) BY MOUTH DAILY (Patient taking differently: Take 10 mg by mouth daily.) 90 tablet 1   tirzepatide (MOUNJARO) 2.5 MG/0.5ML Pen Inject 2.5 mg into the skin once a week. 2 mL 2   No facility-administered medications prior to visit.    Allergies  Allergen Reactions   Compazine [Prochlorperazine Edisylate]     Jaw locked up    Review of Systems  Constitutional:  Negative for chills, fever and weight loss.  Eyes:  Negative for blurred vision and double vision.  Respiratory:  Negative for shortness of breath.   Cardiovascular:  Negative  for chest pain, palpitations and leg swelling.  Gastrointestinal:  Negative for abdominal pain, constipation, diarrhea, nausea and vomiting.  Genitourinary:  Negative for dysuria, frequency  and urgency.  Neurological:  Negative for dizziness and focal weakness.  Endo/Heme/Allergies:  Negative for polydipsia.  Psychiatric/Behavioral:  Negative for depression.        Objective:    Physical Exam Constitutional:      General: She is not in acute distress.    Appearance: She is not ill-appearing.  Eyes:     Extraocular Movements: Extraocular movements intact.     Conjunctiva/sclera: Conjunctivae normal.  Cardiovascular:     Rate and Rhythm: Normal rate and regular rhythm.  Pulmonary:     Effort: Pulmonary effort is normal.     Breath sounds: Normal breath sounds.  Musculoskeletal:     Cervical back: Normal range of motion and neck supple.     Left knee: Swelling present. Decreased range of motion. Tenderness present over the lateral joint line. Normal pulse.  Skin:    General: Skin is warm and dry.  Neurological:     General: No focal deficit present.     Mental Status: She is alert and oriented to person, place, and time.  Psychiatric:        Mood and Affect: Mood normal.        Behavior: Behavior normal.        Thought Content: Thought content normal.      BP 128/86 (BP Location: Left Arm, Patient Position: Sitting, Cuff Size: Large)   Pulse 72   Temp 97.6 F (36.4 C) (Temporal)   Ht 5\' 1"  (1.549 m)   Wt 266 lb (120.7 kg)   SpO2 98%   BMI 50.26 kg/m  Wt Readings from Last 3 Encounters:  09/13/23 266 lb (120.7 kg)  05/24/23 261 lb (118.4 kg)  03/28/23 273 lb (123.8 kg)       Assessment & Plan:   Problem List Items Addressed This Visit     Graves disease   Under the care of endocrinology.       Left lateral knee pain   NKI. Discussed benefits from weight loss. Recommend conservative treatment with RICE, topical Voltaren gel and oral Tylenol or ibuprofen. She  will call and schedule with [facility], she is a patient there.       Mild persistent asthma without complication   Controlled.       OSA (obstructive sleep apnea)   Sleep test shows moderate OSA. Encourage her to call CPAP company regarding device.       Postablative hypothyroidism   Pure hypercholesterolemia   Continue statin. Low fat diet. Check fasting lipids.       Relevant Orders   Lipid panel (Completed)   Type 2 diabetes mellitus with morbid obesity (HCC) - Primary   Start Zepbound. Recommend annual eye exams. Continue losartan and statin.       Relevant Medications   tirzepatide (MOUNJARO) 2.5 MG/0.5ML Pen   Other Relevant Orders   CBC with Differential/Platelet (Completed)   Comprehensive metabolic panel (Completed)   Hemoglobin A1c (Completed)   Uncontrolled hypertension   Improving. Recommend consistency with medications. Encouraged to treat OSA, call [facility] regarding CPAP. Start Zepbound. Monitor at home.  Check renal function. Follow up in 3 mths      Relevant Orders   CBC with Differential/Platelet (Completed)   Comprehensive metabolic panel (Completed)   Vitamin D deficiency   Check vitamin D level and follow up.       Relevant Orders   VITAMIN D 25 Hydroxy (Vit-D Deficiency, Fractures) (Completed)    I am having Allena Earing maintain  her albuterol, meclizine, rosuvastatin, hydrochlorothiazide, losartan, levothyroxine, and tirzepatide.  Meds ordered this encounter  Medications   tirzepatide (MOUNJARO) 2.5 MG/0.5ML Pen    Sig: Inject 2.5 mg into the skin once a week.    Dispense:  2 mL    Refill:  1    Supervising Provider:   Hillard Danker A [[id number]]

## 2023-09-13 NOTE — Progress Notes (Signed)
Her cholesterol is much better.  Keep taking the statin.  Her hemoglobin A1c is 6.0% and her diabetes is well-controlled.  Her kidney function is my concern.  Please ask her to hydrate for 2 to 3 days and then come in for a recheck of her kidney function.  Also, her vitamin D is still low.  Increase her daily vitamin D supplement by 1000 IUs daily. Please order a BMP for decreased GFR.

## 2023-09-15 NOTE — Assessment & Plan Note (Signed)
Continue statin. Low fat diet. Check fasting lipids.

## 2023-09-15 NOTE — Assessment & Plan Note (Signed)
Start Zepbound. Recommend annual eye exams. Continue losartan and statin.

## 2023-09-15 NOTE — Assessment & Plan Note (Addendum)
NKI. Discussed benefits from weight loss. Recommend conservative treatment with RICE, topical Voltaren gel and oral Tylenol or ibuprofen. She will call and schedule with [facility], she is a patient there.

## 2023-09-15 NOTE — Assessment & Plan Note (Signed)
Improving. Recommend consistency with medications. Encouraged to treat OSA, call [facility] regarding CPAP. Start Zepbound. Monitor at home.  Check renal function. Follow up in 3 mths

## 2023-09-15 NOTE — Assessment & Plan Note (Signed)
Check vitamin D level and follow up 

## 2023-09-15 NOTE — Assessment & Plan Note (Signed)
Controlled.  

## 2023-09-15 NOTE — Assessment & Plan Note (Signed)
Under the care of endocrinology 

## 2023-09-15 NOTE — Assessment & Plan Note (Signed)
Sleep test shows moderate OSA. Encourage her to call CPAP company regarding device.

## 2023-09-19 ENCOUNTER — Encounter: Payer: BC Managed Care – PPO | Admitting: Physician Assistant

## 2023-09-23 ENCOUNTER — Other Ambulatory Visit (INDEPENDENT_AMBULATORY_CARE_PROVIDER_SITE_OTHER): Payer: Self-pay

## 2023-09-23 ENCOUNTER — Encounter: Payer: Self-pay | Admitting: Physician Assistant

## 2023-09-23 ENCOUNTER — Ambulatory Visit: Payer: BC Managed Care – PPO | Admitting: Physician Assistant

## 2023-09-23 VITALS — Ht 61.0 in | Wt 266.0 lb

## 2023-09-23 DIAGNOSIS — G8929 Other chronic pain: Secondary | ICD-10-CM

## 2023-09-23 DIAGNOSIS — M25562 Pain in left knee: Secondary | ICD-10-CM

## 2023-09-23 NOTE — Progress Notes (Signed)
Office Visit Note   Patient: Kim Tanner           Date of Birth: 10-20-1970           MRN: [id number] Visit Date: 09/23/2023              Requested by: Avanell Shackleton, NP-C [address] [place],  [place] [address] PCP: Avanell Shackleton, NP-C   Assessment & Plan: Visit Diagnoses:  1. Chronic pain of left knee   2. Left knee pain, unspecified chronicity     Plan: 23-month history of lateral knee pain with mechanical symptoms including locking catching and giving way.  This was after a hyperextension injury while playing tennis 6 months ago.  This is not improving.  Exam was concerning for lateral meniscus tear at this point 6 months into this I would recommend an MRI as she has never had problems with the knee before this will refer her to one of our knee specialist if she has a meniscus tear  Follow-Up Instructions: Will call with MRI results  Orders:  Orders Placed This Encounter  Procedures   XR Knee 1-2 Views Left   No orders of the defined types were placed in this encounter.     Procedures: No procedures performed   Clinical Data: No additional findings.   Subjective: Chief Complaint  Patient presents with   Left Knee - Pain    HPI patient is a pleasant 52 year old woman who presents with a chief complaint of left knee pain.  She describes it is continuous and chronic been going on and off for several months now.  She has to stand for a minute before walking after sitting the knee pops and cracks.  She said this began when she was playing tennis about 6 months ago and hyperextended her knee.  She felt like something tore.  Since then she has had ongoing difficulties her knee locks catches and gives way points to the lateral side of her knee  Review of Systems  All other systems reviewed and are negative.    Objective: Vital Signs: Ht 5\' 1"  (1.549 m)   Wt 266 lb (120.7 kg)   BMI 50.26 kg/m   Physical Exam Constitutional:      Appearance:  Normal appearance.  Pulmonary:     Effort: Pulmonary effort is normal.  Skin:    General: Skin is warm and dry.  Neurological:     Mental Status: She is alert.  Psychiatric:        Mood and Affect: Mood normal.        Behavior: Behavior normal.     Ortho Exam Examination of her left knee no effusion no erythema compartments are soft and nontender she is neurovascular intact she has good endpoint on anterior draw good varus valgus stability she has acute tenderness over the lateral and posterior lateral joint line accentuated with terminal extension and flexion Specialty Comments:  No specialty comments available.  Imaging: No results found.   PMFS History: Patient Active Problem List   Diagnosis Date Noted   Pain in left knee 09/13/2023   Post-menopausal bleeding 01/22/2023   Seasonal allergies 01/22/2023   Fibroids 01/22/2023   Type 2 diabetes mellitus with morbid obesity (HCC) 01/22/2023   Daytime somnolence 09/19/2022   Postablative hypothyroidism 07/12/2022   Low back pain 03/13/2022   OSA (obstructive sleep apnea) 08/24/2020   Mild persistent asthma without complication 08/24/2020   Orthopnea 12/09/2019   At risk for  sleep apnea 12/09/2019   Snoring 12/09/2019   Vitamin D deficiency 05/11/2019   Pure hypercholesterolemia 05/11/2019   Uncontrolled hypertension 09/16/2018   Personal history of noncompliance with medical treatment, presenting hazards to health 09/16/2018   Multiple acquired skin tags 09/16/2018   Prediabetes 11/11/2017   Morbid obesity (HCC) 01/23/2017   Amenorrhea 01/23/2017   Right thyroid nodule 01/23/2017   Essential hypertension 01/23/2017   Graves disease 12/31/2016   Asthma    Past Medical History:  Diagnosis Date   Anxiety    Asthma    Back pain    Dyspnea    HTN (hypertension)    Joint pain    Lower extremity edema    Metabolic syndrome    Morbid obesity (HCC)    Prediabetes    Sleep apnea    Uncontrolled hypertension  09/16/2018    Family History  Problem Relation Age of Onset   Hypertension Mother    Obesity Mother    Hypertension Father    Diabetes Father    Diabetes Sister    Stroke Sister 53   Breast cancer Neg Hx    Colon cancer Neg Hx    Rectal cancer Neg Hx    Stomach cancer Neg Hx    Esophageal cancer Neg Hx    Colon polyps Neg Hx     Past Surgical History:  Procedure Laterality Date   BREAST REDUCTION SURGERY  2008   CESAREAN SECTION     COL[place]OSCOPY     in [place]-normal exam about 9 years ago-no records per pt   DILATION AND CURETTAGE OF UTERUS     INDUCED ABORTION     REDUCTION MAMMAPLASTY     uterine ablation     Social History   Occupational History   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    Employer: [employer]  Tobacco Use   Smoking status: Never   Smokeless tobacco: Never  Vaping Use   Vaping status: Never Used  Substance and Sexual Activity   Alcohol use: Yes    Alcohol/week: 1.0 - 2.0 standard drink of alcohol    Types: 1 - 2 Glasses of wine per week    Comment: socially    Drug use: No   Sexual activity: Not Currently    Birth control/protection: Condom

## 2023-10-03 ENCOUNTER — Other Ambulatory Visit: Payer: Self-pay | Admitting: Medical

## 2023-10-03 ENCOUNTER — Other Ambulatory Visit: Payer: Self-pay | Admitting: Family Medicine

## 2023-10-03 DIAGNOSIS — E7800 Pure hypercholesterolemia, unspecified: Secondary | ICD-10-CM

## 2023-10-03 DIAGNOSIS — I10 Essential (primary) hypertension: Secondary | ICD-10-CM

## 2023-10-19 ENCOUNTER — Other Ambulatory Visit: Payer: Self-pay

## 2023-10-26 ENCOUNTER — Other Ambulatory Visit: Payer: Self-pay

## 2023-11-06 ENCOUNTER — Other Ambulatory Visit: Payer: Self-pay | Admitting: Family Medicine

## 2023-11-06 ENCOUNTER — Telehealth: Payer: Self-pay | Admitting: Family Medicine

## 2023-11-06 MED ORDER — TIRZEPATIDE 5 MG/0.5ML ~~LOC~~ SOAJ: 5.0000 mg | SUBCUTANEOUS | 0 refills | Status: DC

## 2023-11-06 NOTE — Telephone Encounter (Signed)
 Copied from CRM [id number]. Topic: Clinical - Prescription Issue >> Nov 06, 2023  1:58 PM Melissa C wrote: Reason for CRM: patient is currently taking tirzepatide  (MOUNJARO ) 2.5 MG/0.5ML Pen and she stated it is no longer working and she needs a higher dosage. She did say her provider mentioned that if this happened to let her know and an adjustment could be made.

## 2023-12-24 ENCOUNTER — Emergency Department (HOSPITAL_BASED_OUTPATIENT_CLINIC_OR_DEPARTMENT_OTHER)
Admission: EM | Admit: 2023-12-24 | Discharge: 2023-12-24 | Disposition: A | Discharge status: Home / Self Care | Attending: Emergency Medicine | Admitting: Emergency Medicine

## 2023-12-24 ENCOUNTER — Other Ambulatory Visit: Payer: Self-pay

## 2023-12-24 ENCOUNTER — Encounter (HOSPITAL_BASED_OUTPATIENT_CLINIC_OR_DEPARTMENT_OTHER): Payer: Self-pay | Admitting: Emergency Medicine

## 2023-12-24 DIAGNOSIS — R197 Diarrhea, unspecified: Secondary | ICD-10-CM | POA: Diagnosis not present

## 2023-12-24 DIAGNOSIS — R1013 Epigastric pain: Secondary | ICD-10-CM | POA: Diagnosis not present

## 2023-12-24 DIAGNOSIS — R112 Nausea with vomiting, unspecified: Secondary | ICD-10-CM | POA: Diagnosis present

## 2023-12-24 HISTORY — DX: Thyrotoxicosis with diffuse goiter without thyrotoxic crisis or storm: E05.00

## 2023-12-24 HISTORY — DX: Disorder of thyroid, unspecified: E07.9

## 2023-12-24 HISTORY — DX: Type 2 diabetes mellitus without complications: E11.9

## 2023-12-24 LAB — COMPREHENSIVE METABOLIC PANEL
ALT: 17 U/L (ref 0–44)
AST: 19 U/L (ref 15–41)
Albumin: 4.5 g/dL (ref 3.5–5.0)
Alkaline Phosphatase: 75 U/L (ref 38–126)
Anion gap: 8 (ref 5–15)
BUN: 14 mg/dL (ref 6–20)
CO2: 30 mmol/L (ref 22–32)
Calcium: 9 mg/dL (ref 8.9–10.3)
Chloride: 99 mmol/L (ref 98–111)
Creatinine, Ser: 1.52 mg/dL — ABNORMAL HIGH (ref 0.44–1.00)
GFR, Estimated: 42 mL/min — ABNORMAL LOW (ref >60)
Glucose, Bld: 118 mg/dL — ABNORMAL HIGH (ref 70–99)
Potassium: 3.4 mmol/L — ABNORMAL LOW (ref 3.5–5.1)
Sodium: 137 mmol/L (ref 135–145)
Total Bilirubin: 0.7 mg/dL (ref 0.0–1.2)
Total Protein: 7.2 g/dL (ref 6.5–8.1)

## 2023-12-24 LAB — CBC WITH DIFFERENTIAL/PLATELET
Abs Immature Granulocytes: 0.02 10*3/uL (ref 0.00–0.07)
Basophils Absolute: 0 10*3/uL (ref 0.0–0.1)
Basophils Relative: 0 %
Eosinophils Absolute: 0 10*3/uL (ref 0.0–0.5)
Eosinophils Relative: 1 %
HCT: 37.9 % (ref 36.0–46.0)
Hemoglobin: 13.2 g/dL (ref 12.0–15.0)
Immature Granulocytes: 0 %
Lymphocytes Relative: 13 %
Lymphs Abs: 0.8 10*3/uL (ref 0.7–4.0)
MCH: 29.5 pg (ref 26.0–34.0)
MCHC: 34.8 g/dL (ref 30.0–36.0)
MCV: 84.8 fL (ref 80.0–100.0)
Monocytes Absolute: 0.2 10*3/uL (ref 0.1–1.0)
Monocytes Relative: 3 %
Neutro Abs: 5.4 10*3/uL (ref 1.7–7.7)
Neutrophils Relative %: 83 %
Platelets: 241 10*3/uL (ref 150–400)
RBC: 4.47 MIL/uL (ref 3.87–5.11)
RDW: 13.5 % (ref 11.5–15.5)
WBC: 6.5 10*3/uL (ref 4.0–10.5)
nRBC: 0 % (ref 0.0–0.2)

## 2023-12-24 LAB — LIPASE, BLOOD: Lipase: 34 U/L (ref 11–51)

## 2023-12-24 MED ORDER — ONDANSETRON HCL 4 MG/2ML IJ SOLN
4.0000 mg | Freq: Once | INTRAMUSCULAR | Status: AC
  Administered 2023-12-24: 4 mg via INTRAVENOUS
  Filled 2023-12-24: qty 2

## 2023-12-24 MED ORDER — SODIUM CHLORIDE 0.9 % IV BOLUS
1000.0000 mL | Freq: Once | INTRAVENOUS | Status: AC
  Administered 2023-12-24: 1000 mL via INTRAVENOUS

## 2023-12-24 MED ORDER — MORPHINE SULFATE (PF) 4 MG/ML IV SOLN
4.0000 mg | Freq: Once | INTRAVENOUS | Status: AC
  Administered 2023-12-24: 4 mg via INTRAVENOUS
  Filled 2023-12-24: qty 1

## 2023-12-24 MED ORDER — ONDANSETRON 4 MG PO TBDP: ORAL_TABLET | ORAL | 0 refills | Status: DC

## 2023-12-24 NOTE — ED Triage Notes (Signed)
 Pt to ED reporting right sided abd pain with generalized body aches, dizziness, fevers and vomiting for the past 3 days. Symptoms worse today.

## 2023-12-24 NOTE — Discharge Instructions (Signed)
 Take the nausea medicine for nausea you can take Imodium for diarrhea.  Please return for sudden worsening abdominal pain inability eat or drink.

## 2023-12-24 NOTE — ED Provider Notes (Signed)
 [facility] EMERGENCY DEPARTMENT AT [facility] Provider Note   CSN: [id number] Arrival date & time: 12/24/23  9562     History  Chief Complaint  Patient presents with   Abdominal Pain   Generalized Body Aches    Kim Tanner is a 53 y.o. female.  53 yo F with a chief complaints of nausea vomiting and diarrhea.  Going on for couple days now.  No known sick contacts.  Having some epigastric discomfort with this.  Low-grade fevers at home as well.  She felt more weak and fatigued today and came into the ED for evaluation.   Abdominal Pain      Home Medications Prior to Admission medications   Medication Sig Start Date End Date Taking? Authorizing Provider  ondansetron (ZOFRAN-ODT) 4 MG disintegrating tablet 4mg  ODT q4 hours prn nausea/vomit 12/24/23  Yes Melene Plan, DO  albuterol (PROVENTIL HFA;VENTOLIN HFA) 108 (90 Base) MCG/ACT inhaler Inhale 2 puffs into the lungs every 6 (six) hours as needed for wheezing or shortness of breath. 03/13/18   Henson, Vickie L, NP-C  hydrochlorothiazide (HYDRODIURIL) 25 MG tablet TAKE 1 TABLET(25 MG) BY MOUTH DAILY 10/03/23   Henson, Vickie L, NP-C  levothyroxine (SYNTHROID) 125 MCG tablet Take 1 tablet (125 mcg total) by mouth daily before breakfast. 04/12/23   Carlus Pavlov, MD  losartan (COZAAR) 100 MG tablet TAKE 1 TABLET(100 MG) BY MOUTH DAILY 10/03/23   Henson, Vickie L, NP-C  meclizine (ANTIVERT) 25 MG tablet TAKE 1 TABLET(25 MG) BY MOUTH TWICE DAILY AS NEEDED FOR DIZZINESS Patient taking differently: Take 25 mg by mouth 2 (two) times daily as needed for dizziness. 11/07/21   Burnard Hawthorne B, PA-C  rosuvastatin (CRESTOR) 10 MG tablet TAKE 1 TABLET(10 MG) BY MOUTH DAILY 10/03/23   Henson, Vickie L, NP-C  tirzepatide [facility]) 5 MG/0.5ML Pen Inject 5 mg into the skin once a week. 11/06/23   Avanell Shackleton, NP-C      Allergies    Compazine [prochlorperazine edisylate]    Review of Systems   Review of Systems  Gastrointestinal:   Positive for abdominal pain.    Physical Exam Updated Vital Signs BP 118/70   Pulse (!) 103   Temp 98.7 F (37.1 C) (Oral)   Resp 12   SpO2 100%  Physical Exam Vitals and nursing note reviewed.  Constitutional:      General: She is not in acute distress.    Appearance: She is well-developed. She is not diaphoretic.  HENT:     Head: Normocephalic and atraumatic.  Eyes:     Pupils: Pupils are equal, round, and reactive to light.  Cardiovascular:     Rate and Rhythm: Normal rate and regular rhythm.     Heart sounds: No murmur heard.    No friction rub. No gallop.  Pulmonary:     Effort: Pulmonary effort is normal.     Breath sounds: No wheezing or rales.  Abdominal:     General: There is no distension.     Palpations: Abdomen is soft.     Tenderness: There is abdominal tenderness.     Comments: Mild epigastric tenderness.  No significant pain in the right upper quadrant.  Negative Murphy sign.  Musculoskeletal:        General: No tenderness.     Cervical back: Normal range of motion and neck supple.  Skin:    General: Skin is warm and dry.  Neurological:     Mental Status: She is alert and  oriented to person, place, and time.  Psychiatric:        Behavior: Behavior normal.     ED Results / Procedures / Treatments   Labs (all labs ordered are listed, but only abnormal results are displayed) Labs Reviewed  COMPREHENSIVE METABOLIC PANEL - Abnormal; Notable for the following components:      Result Value   Potassium 3.4 (*)    Glucose, Bld 118 (*)    Creatinine, Ser 1.52 (*)    GFR, Estimated 42 (*)    All other components within normal limits  CBC WITH DIFFERENTIAL/PLATELET  LIPASE, BLOOD    EKG None  Radiology No results found.  Procedures Procedures    Medications Ordered in ED Medications  sodium chloride 0.9 % bolus 1,000 mL (1,000 mLs Intravenous New Bag/Given 12/24/23 1937)  morphine (PF) 4 MG/ML injection 4 mg (4 mg Intravenous Given 12/24/23  1939)  ondansetron (ZOFRAN) injection 4 mg (4 mg Intravenous Given 12/24/23 1938)    ED Course/ Medical Decision Making/ A&P                                 Medical Decision Making Amount and/or Complexity of Data Reviewed Labs: ordered.  Risk Prescription drug management.   53 yo F with a chief complaints of epigastric abdominal discomfort nausea vomiting and diarrhea.  Going on for about 48 hours.  Likely viral syndrome by history.  Will treat symptoms.  Bolus of IV fluids.  Reassess.  No leukocytosis.  Renal function mildly worse than baseline.  Potassium 3.4 LFT and lipase are unremarkable.  Patient on repeat assessment feeling a bit better.  Tolerating by mouth.  Will discharge home.  8:43 PM:  I have discussed the diagnosis/risks/treatment options with the patient.  Evaluation and diagnostic testing in the emergency department does not suggest an emergent condition requiring admission or immediate intervention beyond what has been performed at this time.  They will follow up with PCP. We also discussed returning to the ED immediately if new or worsening sx occur. We discussed the sx which are most concerning (e.g., sudden worsening pain, fever, inability to tolerate by mouth) that necessitate immediate return. Medications administered to the patient during their visit and any new prescriptions provided to the patient are listed below.  Medications given during this visit Medications  sodium chloride 0.9 % bolus 1,000 mL (1,000 mLs Intravenous New Bag/Given 12/24/23 1937)  morphine (PF) 4 MG/ML injection 4 mg (4 mg Intravenous Given 12/24/23 1939)  ondansetron (ZOFRAN) injection 4 mg (4 mg Intravenous Given 12/24/23 1938)     The patient appears reasonably screen and/or stabilized for discharge and I doubt any other medical condition or other [facility] requiring further screening, evaluation, or treatment in the ED at this time prior to discharge.          Final Clinical  Impression(s) / ED Diagnoses Final diagnoses:  Nausea vomiting and diarrhea    Rx / DC Orders ED Discharge Orders          Ordered    ondansetron (ZOFRAN-ODT) 4 MG disintegrating tablet        12/24/23 2041              Melene Plan, DO 12/24/23 2043

## 2024-01-16 ENCOUNTER — Ambulatory Visit (INDEPENDENT_AMBULATORY_CARE_PROVIDER_SITE_OTHER): Admitting: Family Medicine

## 2024-01-16 ENCOUNTER — Encounter: Payer: Self-pay | Admitting: Family Medicine

## 2024-01-16 DIAGNOSIS — E1169 Type 2 diabetes mellitus with other specified complication: Secondary | ICD-10-CM | POA: Diagnosis not present

## 2024-01-16 DIAGNOSIS — Z7985 Long-term (current) use of injectable non-insulin antidiabetic drugs: Secondary | ICD-10-CM

## 2024-01-16 DIAGNOSIS — Z6841 Body Mass Index (BMI) 40.0 and over, adult: Secondary | ICD-10-CM

## 2024-01-16 DIAGNOSIS — I10 Essential (primary) hypertension: Secondary | ICD-10-CM

## 2024-01-16 DIAGNOSIS — E559 Vitamin D deficiency, unspecified: Secondary | ICD-10-CM

## 2024-01-16 DIAGNOSIS — G4733 Obstructive sleep apnea (adult) (pediatric): Secondary | ICD-10-CM

## 2024-01-16 DIAGNOSIS — E7800 Pure hypercholesterolemia, unspecified: Secondary | ICD-10-CM

## 2024-01-16 LAB — CBC WITH DIFFERENTIAL/PLATELET
Basophils Absolute: 0 10*3/uL (ref 0.0–0.1)
Basophils Relative: 0.6 % (ref 0.0–3.0)
Eosinophils Absolute: 0.1 10*3/uL (ref 0.0–0.7)
Eosinophils Relative: 1.6 % (ref 0.0–5.0)
HCT: 36.1 % (ref 36.0–46.0)
Hemoglobin: 12.1 g/dL (ref 12.0–15.0)
Lymphocytes Relative: 28.2 % (ref 12.0–46.0)
Lymphs Abs: 2.2 10*3/uL (ref 0.7–4.0)
MCHC: 33.4 g/dL (ref 30.0–36.0)
MCV: 87.8 fl (ref 78.0–100.0)
Monocytes Absolute: 0.3 10*3/uL (ref 0.1–1.0)
Monocytes Relative: 4.2 % (ref 3.0–12.0)
Neutro Abs: 5.1 10*3/uL (ref 1.4–7.7)
Neutrophils Relative %: 65.4 % (ref 43.0–77.0)
Platelets: 264 10*3/uL (ref 150.0–400.0)
RBC: 4.1 Mil/uL (ref 3.87–5.11)
RDW: 15.4 % (ref 11.5–15.5)
WBC: 7.9 10*3/uL (ref 4.0–10.5)

## 2024-01-16 LAB — HEMOGLOBIN A1C: Hgb A1c MFr Bld: 6 % (ref 4.6–6.5)

## 2024-01-16 LAB — COMPREHENSIVE METABOLIC PANEL WITH GFR
ALT: 17 U/L (ref 0–35)
AST: 19 U/L (ref 0–37)
Albumin: 4.6 g/dL (ref 3.5–5.2)
Alkaline Phosphatase: 70 U/L (ref 39–117)
BUN: 9 mg/dL (ref 6–23)
CO2: 31 meq/L (ref 19–32)
Calcium: 9.5 mg/dL (ref 8.4–10.5)
Chloride: 102 meq/L (ref 96–112)
Creatinine, Ser: 1.07 mg/dL (ref 0.40–1.20)
GFR: 60.56 mL/min (ref >60.00)
Glucose, Bld: 98 mg/dL (ref 70–99)
Potassium: 3.5 meq/L (ref 3.5–5.1)
Sodium: 139 meq/L (ref 135–145)
Total Bilirubin: 0.5 mg/dL (ref 0.2–1.2)
Total Protein: 7.6 g/dL (ref 6.0–8.3)

## 2024-01-16 LAB — MICROALBUMIN / CREATININE URINE RATIO
Creatinine,U: 73.9 mg/dL
Microalb Creat Ratio: UNDETERMINED mg/g (ref 0.0–30.0)
Microalb, Ur: <0.7 mg/dL

## 2024-01-16 MED ORDER — TIRZEPATIDE 7.5 MG/0.5ML ~~LOC~~ SOAJ: 7.5000 mg | SUBCUTANEOUS | 1 refills | Status: DC

## 2024-01-16 MED ORDER — VALSARTAN-HYDROCHLOROTHIAZIDE 320-25 MG PO TABS: 1.0000 | ORAL_TABLET | Freq: Every day | ORAL | 3 refills | Status: AC

## 2024-01-16 NOTE — Progress Notes (Signed)
 Subjective:     Patient ID: Kim Tanner, female    DOB: 07-08-1971, 53 y.o.   MRN: [id number]  Chief Complaint  Patient presents with   Follow-up    Discuss about Mounjaro  med  Back pain on right side, come and go about 2 months    HPI   History of Present Illness         Here to follow up on chronic health conditions.  Her blood pressure is not being controlled. BP at home in the 150s/90s.   OSA- moderate. States she is using her CPAP  [facility]   DM- last A1c 6.0% She has been on Mounjaro  5 mg  She has lost weight. Appetite has returned on medication.   Dr. Aldona Amel is following her for hypothyroidism      Health Maintenance Due  Topic Date Due   FOOT EXAM  Never done   OPHTHALMOLOGY EXAM  Never done   HIV Screening  Never done   Pneumococcal Vaccine 41-50 Years old (1 of 2 - PCV) Never done   Zoster Vaccines- Shingrix (1 of 2) Never done   Cervical Cancer Screening (HPV/Pap Cotest)  12/30/2020   MAMMOGRAM  07/11/2023    Past Medical History:  Diagnosis Date   Anxiety    Asthma    Back pain    Diabetes mellitus without complication (HCC)    Dyspnea    Graves disease    HTN (hypertension)    Joint pain    Lower extremity edema    Metabolic syndrome    Morbid obesity (HCC)    Prediabetes    Sleep apnea    Thyroid  disease    Uncontrolled hypertension 09/16/2018    Past Surgical History:  Procedure Laterality Date   BREAST REDUCTION SURGERY  2008   CESAREAN SECTION     [place]     [place] exam about 9 years ago-no records per pt   DILATION AND CURETTAGE OF UTERUS     INDUCED ABORTION     REDUCTION MAMMAPLASTY     uterine ablation      Family History  Problem Relation Age of Onset   Hypertension Mother    Obesity Mother    Hypertension Father    Diabetes Father    Diabetes Sister    Stroke Sister 59   Breast cancer Neg Hx    Colon cancer Neg Hx    Rectal cancer Neg Hx    Stomach cancer Neg Hx    Esophageal cancer  Neg Hx    Colon polyps Neg Hx     Social History   Socioeconomic History   Marital status: Single    Spouse name: Not on file   Number of children: 2   Years of education: Not on file   Highest education level: Bachelor's degree (e.g., BA, AB, BS)  Occupational History   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    Employer: [employer]  Tobacco Use   Smoking status: Never   Smokeless tobacco: Never  Vaping Use   Vaping status: Never Used  Substance and Sexual Activity   Alcohol use: Yes    Alcohol/week: 1.0 - 2.0 standard drink of alcohol    Types: 1 - 2 Glasses of wine per week    Comment: socially    Drug use: No   Sexual activity: Not Currently    Birth control/protection: Condom  Other Topics Concern   Not on file  Social History Narrative   Not  on file   Social Drivers of Health   Financial Resource Strain: Low Risk  (01/21/2023)   Overall Financial Resource Strain (CARDIA)    Difficulty of Paying Living Expenses: Not hard at all  Food Insecurity: No Food Insecurity (01/21/2023)   Hunger Vital Sign    Worried About Running Out of Food in the Last Year: Never true    Ran Out of Food in the Last Year: Never true  Transportation Needs: No Transportation Needs (01/21/2023)   PRAPARE - Administrator, Civil Service (Medical): No    Lack of Transportation (Non-Medical): No  Physical Activity: Insufficiently Active (01/21/2023)   Exercise Vital Sign    Days of Exercise per Week: 2 days    Minutes of Exercise per Session: 20 min  Stress: No Stress Concern Present (01/21/2023)   [employer] of Occupational Health - Occupational Stress Questionnaire    Feeling of Stress : Only a little  Social Connections: Moderately Isolated (01/21/2023)   Social Connection and Isolation Panel [NHANES]    Frequency of Communication with Friends and Family: More than three times a week    Frequency of Social Gatherings with Friends and Family: Once a week     Attends Religious Services: More than 4 times per year    Active Member of [employer] or Organizations: No    Attends Engineer, structural: Not on file    Marital Status: Never married  Intimate Partner Violence: Not on file    Outpatient Medications Prior to Visit  Medication Sig Dispense Refill   albuterol  (PROVENTIL  HFA;VENTOLIN  HFA) 108 (90 Base) MCG/ACT inhaler Inhale 2 puffs into the lungs every 6 (six) hours as needed for wheezing or shortness of breath. 1 Inhaler 1   levothyroxine  (SYNTHROID ) 125 MCG tablet Take 1 tablet (125 mcg total) by mouth daily before breakfast. 90 tablet 1   meclizine  (ANTIVERT ) 25 MG tablet TAKE 1 TABLET(25 MG) BY MOUTH TWICE DAILY AS NEEDED FOR DIZZINESS (Patient taking differently: Take 25 mg by mouth 2 (two) times daily as needed for dizziness.) 30 tablet 1   ondansetron  (ZOFRAN -ODT) 4 MG disintegrating tablet 4mg  ODT q4 hours prn nausea/vomit 20 tablet 0   rosuvastatin  (CRESTOR ) 10 MG tablet TAKE 1 TABLET(10 MG) BY MOUTH DAILY 90 tablet 1   hydrochlorothiazide  (HYDRODIURIL ) 25 MG tablet TAKE 1 TABLET(25 MG) BY MOUTH DAILY 90 tablet 1   losartan  (COZAAR ) 100 MG tablet TAKE 1 TABLET(100 MG) BY MOUTH DAILY 90 tablet 0   tirzepatide  (MOUNJARO ) 5 MG/0.5ML Pen Inject 5 mg into the skin once a week. 6 mL 0   No facility-administered medications prior to visit.    Allergies  Allergen Reactions   Compazine [Prochlorperazine Edisylate]     Jaw locked up    Review of Systems  Constitutional:  Positive for weight loss. Negative for chills and fever.  Eyes:  Negative for blurred vision and double vision.  Respiratory:  Negative for shortness of breath.   Cardiovascular:  Negative for chest pain, palpitations and leg swelling.  Gastrointestinal:  Negative for abdominal pain, constipation, diarrhea, nausea and vomiting.  Genitourinary:  Negative for dysuria, frequency and urgency.  Musculoskeletal:  Positive for back pain.  Neurological:  Negative for  dizziness, focal weakness and headaches.   Hello did you recheck her blood    Objective:    Physical Exam Constitutional:      General: She is not in acute distress.    Appearance: She is obese. She is not  ill-appearing.  Eyes:     Extraocular Movements: Extraocular movements intact.     Conjunctiva/sclera: Conjunctivae normal.  Cardiovascular:     Rate and Rhythm: Normal rate.  Pulmonary:     Effort: Pulmonary effort is normal.  Musculoskeletal:     Cervical back: Normal range of motion and neck supple.  Skin:    General: Skin is warm and dry.  Neurological:     General: No focal deficit present.     Mental Status: She is alert and oriented to person, place, and time.     Motor: No weakness.     Coordination: Coordination normal.     Gait: Gait normal.  Psychiatric:        Mood and Affect: Mood normal.        Behavior: Behavior normal.        Thought Content: Thought content normal.      BP (!) 146/90   Pulse 99   Temp 97.7 F (36.5 C) (Temporal)   Ht 5\' 1"  (1.549 m)   Wt 262 lb (118.8 kg)   SpO2 (!) 65%   BMI 49.50 kg/m  Wt Readings from Last 3 Encounters:  01/16/24 262 lb (118.8 kg)  09/23/23 266 lb (120.7 kg)  09/13/23 266 lb (120.7 kg)       Assessment & Plan:   Problem List Items Addressed This Visit     Morbid obesity (HCC)   Increase Mounjaro  to 7.5 mg. Continue healthy diet and exercise.       Relevant Medications   tirzepatide  (MOUNJARO ) 7.5 MG/0.5ML Pen   Other Relevant Orders   Hemoglobin A1c (Completed)   CBC with Differential/Platelet (Completed)   Comprehensive metabolic panel with GFR (Completed)   OSA (obstructive sleep apnea)   Sleep test shows moderate OSA. Encourage her to call CPAP company regarding device. Encourage good compliance.       Pure hypercholesterolemia   Continue statin. Low fat diet.       Relevant Medications   valsartan -hydrochlorothiazide  (DIOVAN -HCT) 320-25 MG tablet   Type 2 diabetes mellitus with  morbid obesity (HCC) - Primary   Continue Mounjaro . Follow up pending A1c and renal function. Check urine microalbumin      Relevant Medications   tirzepatide  (MOUNJARO ) 7.5 MG/0.5ML Pen   valsartan -hydrochlorothiazide  (DIOVAN -HCT) 320-25 MG tablet   Other Relevant Orders   Hemoglobin A1c (Completed)   Microalbumin / creatinine urine ratio (Completed)   CBC with Differential/Platelet (Completed)   Comprehensive metabolic panel with GFR (Completed)   Uncontrolled hypertension   Uncontrolled. Switch from losartan  to valsartan . Continue hydrochlorothiazide .  Encouraged to treat OSA, call [facility] regarding CPAP. Continue Mounjaro .  Monitor at home.  Check renal function. Follow up in 6 wks      Relevant Medications   valsartan -hydrochlorothiazide  (DIOVAN -HCT) 320-25 MG tablet   Other Relevant Orders   CBC with Differential/Platelet (Completed)   Comprehensive metabolic panel with GFR (Completed)   EKG 12-Lead (Completed)   Vitamin D  deficiency   Continue vitamin D  supplement       EKG shows NSR, rate 63, no acute ST-T wave changes   I have discontinued Briar Sahni's hydrochlorothiazide , losartan , and tirzepatide . I am also having her start on tirzepatide  and valsartan -hydrochlorothiazide . Additionally, I am having her maintain her albuterol , meclizine , levothyroxine , rosuvastatin , and ondansetron .  Meds ordered this encounter  Medications   tirzepatide  (MOUNJARO ) 7.5 MG/0.5ML Pen    Sig: Inject 7.5 mg into the skin once a week.    Dispense:  6  mL    Refill:  1    Supervising Provider:   Bambi Lever A [[id number]]   valsartan -hydrochlorothiazide  (DIOVAN -HCT) 320-25 MG tablet    Sig: Take 1 tablet by mouth daily.    Dispense:  90 tablet    Refill:  3    Supervising Provider:   Bambi Lever A [[id number]]

## 2024-01-16 NOTE — Patient Instructions (Addendum)
 Please go downstairs for labs and a urine test before you leave.  I am changing your blood pressure medication.  Start taking Valsartan/HCTZ. Stop taking losartan and hydrochlorothiazide individual pills.  Continue watching your sodium and continue exercising.  Monitor your blood pressure at home and I would like to see you back in 6 weeks.  Remember to call [facility] and Dr. Aldona Amel

## 2024-01-20 NOTE — Assessment & Plan Note (Signed)
 Increase Mounjaro  to 7.5 mg. Continue healthy diet and exercise.

## 2024-01-20 NOTE — Assessment & Plan Note (Signed)
 Uncontrolled. Switch from losartan  to valsartan . Continue hydrochlorothiazide .  Encouraged to treat OSA, call [facility] regarding CPAP. Continue Mounjaro .  Monitor at home.  Check renal function. Follow up in 6 wks

## 2024-01-20 NOTE — Assessment & Plan Note (Signed)
 Sleep test shows moderate OSA. Encourage her to call CPAP company regarding device. Encourage good compliance.

## 2024-01-20 NOTE — Assessment & Plan Note (Addendum)
 Continue Mounjaro . Follow up pending A1c and renal function. Check urine microalbumin

## 2024-01-20 NOTE — Assessment & Plan Note (Addendum)
 Continue vitamin D supplement

## 2024-01-20 NOTE — Assessment & Plan Note (Addendum)
 Continue statin  Low fat diet

## 2024-02-27 ENCOUNTER — Ambulatory Visit: Admitting: Family Medicine

## 2024-03-13 ENCOUNTER — Other Ambulatory Visit: Payer: Self-pay | Admitting: Internal Medicine

## 2024-03-30 MED ORDER — LEVOTHYROXINE SODIUM 125 MCG PO TABS: 125.0000 ug | ORAL_TABLET | Freq: Every day | ORAL | 1 refills | Status: DC

## 2024-03-30 NOTE — Addendum Note (Signed)
 Addended by: CLEOTILDE ROLIN RAMAN on: 03/30/2024 08:50 AM   Modules accepted: Orders

## 2024-04-01 ENCOUNTER — Encounter: Payer: Self-pay | Admitting: Family Medicine

## 2024-04-01 ENCOUNTER — Ambulatory Visit: Admitting: Family Medicine

## 2024-04-01 ENCOUNTER — Ambulatory Visit: Payer: Self-pay | Admitting: Family Medicine

## 2024-04-01 VITALS — BP 128/82 | HR 59 | Temp 97.6°F | Ht 61.0 in | Wt 254.0 lb

## 2024-04-01 DIAGNOSIS — Z7985 Long-term (current) use of injectable non-insulin antidiabetic drugs: Secondary | ICD-10-CM | POA: Diagnosis not present

## 2024-04-01 DIAGNOSIS — E1169 Type 2 diabetes mellitus with other specified complication: Secondary | ICD-10-CM

## 2024-04-01 DIAGNOSIS — Z6841 Body Mass Index (BMI) 40.0 and over, adult: Secondary | ICD-10-CM | POA: Diagnosis not present

## 2024-04-01 DIAGNOSIS — N183 Chronic kidney disease, stage 3 unspecified: Secondary | ICD-10-CM

## 2024-04-01 DIAGNOSIS — Z0001 Encounter for general adult medical examination with abnormal findings: Secondary | ICD-10-CM

## 2024-04-01 DIAGNOSIS — M62838 Other muscle spasm: Secondary | ICD-10-CM

## 2024-04-01 DIAGNOSIS — E7800 Pure hypercholesterolemia, unspecified: Secondary | ICD-10-CM | POA: Diagnosis not present

## 2024-04-01 DIAGNOSIS — E559 Vitamin D deficiency, unspecified: Secondary | ICD-10-CM | POA: Diagnosis not present

## 2024-04-01 DIAGNOSIS — I10 Essential (primary) hypertension: Secondary | ICD-10-CM

## 2024-04-01 DIAGNOSIS — G4733 Obstructive sleep apnea (adult) (pediatric): Secondary | ICD-10-CM

## 2024-04-01 DIAGNOSIS — E890 Postprocedural hypothyroidism: Secondary | ICD-10-CM

## 2024-04-01 DIAGNOSIS — J4530 Mild persistent asthma, uncomplicated: Secondary | ICD-10-CM

## 2024-04-01 DIAGNOSIS — E876 Hypokalemia: Secondary | ICD-10-CM

## 2024-04-01 LAB — VITAMIN B12: Vitamin B-12: 321 pg/mL (ref 211–911)

## 2024-04-01 LAB — COMPREHENSIVE METABOLIC PANEL WITH GFR
ALT: 16 U/L (ref 0–35)
AST: 19 U/L (ref 0–37)
Albumin: 4.2 g/dL (ref 3.5–5.2)
Alkaline Phosphatase: 67 U/L (ref 39–117)
BUN: 12 mg/dL (ref 6–23)
CO2: 33 meq/L — ABNORMAL HIGH (ref 19–32)
Calcium: 9.3 mg/dL (ref 8.4–10.5)
Chloride: 103 meq/L (ref 96–112)
Creatinine, Ser: 1.49 mg/dL — ABNORMAL HIGH (ref 0.40–1.20)
GFR: 40.65 mL/min — ABNORMAL LOW (ref >60.00)
Glucose, Bld: 108 mg/dL — ABNORMAL HIGH (ref 70–99)
Potassium: 3.4 meq/L — ABNORMAL LOW (ref 3.5–5.1)
Sodium: 141 meq/L (ref 135–145)
Total Bilirubin: 0.3 mg/dL (ref 0.2–1.2)
Total Protein: 7.1 g/dL (ref 6.0–8.3)

## 2024-04-01 LAB — CBC WITH DIFFERENTIAL/PLATELET
Basophils Absolute: 0.1 10*3/uL (ref 0.0–0.1)
Basophils Relative: 0.8 % (ref 0.0–3.0)
Eosinophils Absolute: 0.2 10*3/uL (ref 0.0–0.7)
Eosinophils Relative: 2.3 % (ref 0.0–5.0)
HCT: 34.1 % — ABNORMAL LOW (ref 36.0–46.0)
Hemoglobin: 11.7 g/dL — ABNORMAL LOW (ref 12.0–15.0)
Lymphocytes Relative: 29.9 % (ref 12.0–46.0)
Lymphs Abs: 2.3 10*3/uL (ref 0.7–4.0)
MCHC: 34.3 g/dL (ref 30.0–36.0)
MCV: 85.9 fl (ref 78.0–100.0)
Monocytes Absolute: 0.3 10*3/uL (ref 0.1–1.0)
Monocytes Relative: 4.3 % (ref 3.0–12.0)
Neutro Abs: 4.8 10*3/uL (ref 1.4–7.7)
Neutrophils Relative %: 62.7 % (ref 43.0–77.0)
Platelets: 235 10*3/uL (ref 150.0–400.0)
RBC: 3.97 Mil/uL (ref 3.87–5.11)
RDW: 14.8 % (ref 11.5–15.5)
WBC: 7.7 10*3/uL (ref 4.0–10.5)

## 2024-04-01 LAB — LIPID PANEL
Cholesterol: 144 mg/dL (ref 0–200)
HDL: 39.5 mg/dL (ref >39.00)
LDL Cholesterol: 87 mg/dL (ref 0–99)
NonHDL: 104.19
Total CHOL/HDL Ratio: 4
Triglycerides: 86 mg/dL (ref 0.0–149.0)
VLDL: 17.2 mg/dL (ref 0.0–40.0)

## 2024-04-01 LAB — TSH: TSH: 64.13 u[IU]/mL — ABNORMAL HIGH (ref 0.35–5.50)

## 2024-04-01 LAB — MAGNESIUM: Magnesium: 2.2 mg/dL (ref 1.5–2.5)

## 2024-04-01 LAB — VITAMIN D 25 HYDROXY (VIT D DEFICIENCY, FRACTURES): VITD: 20.74 ng/mL — ABNORMAL LOW (ref 30.00–100.00)

## 2024-04-01 LAB — HEMOGLOBIN A1C: Hgb A1c MFr Bld: 5.7 % (ref 4.6–6.5)

## 2024-04-01 MED ORDER — POTASSIUM CHLORIDE CRYS ER 10 MEQ PO TBCR: 10.0000 meq | EXTENDED_RELEASE_TABLET | Freq: Every day | ORAL | 0 refills | Status: AC

## 2024-04-01 MED ORDER — VITAMIN D (ERGOCALCIFEROL) 1.25 MG (50000 UNIT) PO CAPS
50000.0000 [IU] | ORAL_CAPSULE | ORAL | 1 refills | Status: AC
Start: 2024-04-01 — End: ?

## 2024-04-01 NOTE — Assessment & Plan Note (Signed)
 Vitamin D  is low. Ergocalciferol  50,000 IUs sent to pharmacy x 12 weeks.

## 2024-04-01 NOTE — Assessment & Plan Note (Signed)
 Improving. Now has CKD. Continue current medications and low sodium diet.  Weight loss will help. She is not treating her OSA. Continue to monitor at home. Follow up in 4-6 weeks.

## 2024-04-01 NOTE — Assessment & Plan Note (Signed)
 Continue Mounjaro . 8 lb weight loss since last visit. Controlled. Recommend annual eye exam. Foot exam normal. Renal function worsening. Refer to nephrology or add SGLT-2.

## 2024-04-01 NOTE — Progress Notes (Signed)
 Please let her know that her TSH is 64 and very high she should follow-up with Dr. Trixie as soon as possible. Her kidney function is significantly decreased and her potassium is low. I will send in a potassium supplement for her to take daily going forward. I will also send in high dose vitamin D  since her vitamin D  is low. Please ask her to hydrate and follow up with me in 2-4 weeks to recheck her kidney function and potassium.

## 2024-04-01 NOTE — Assessment & Plan Note (Signed)
 Sleep test shows moderate OSA. She is not treating it.  Discussed potential health consequences associated with untreated sleep apnea. Encouraged her to call CPAP company regarding device.

## 2024-04-01 NOTE — Assessment & Plan Note (Signed)
 TSH 64 today. Recommend good compliance taking medication on empty stomach in the morning. Managed by Dr. Trixie. She has not scheduled a follow up yet. I have recommended she call and schedule ASAP.

## 2024-04-01 NOTE — Assessment & Plan Note (Signed)
 Due to hydrochlorothiazide  and possibly poor potassium intake. Replace with K-dur 10 MEQ daily. Recheck in 4-6 weeks

## 2024-04-01 NOTE — Progress Notes (Signed)
 Complete physical exam  Patient: Kim Tanner   DOB: [birth year]   53 y.o. Female  MRN: [id number]  Subjective:    Chief Complaint  Patient presents with   Annual Exam   She is here for a complete physical exam.  [facility] and reports being UTD with mammogram and pap smear  Sees Dr. Trixie for hypothyroidism.   C/o intermittent abdominal spasms.   On Mounjaro  weekly and doing well. Losing weight. Diet improved, eating a lower calorie and fewer carbs diet.    Eye exam overdue   Is not using CPAP - does not like how it makes her feel.   Declines vaccines today     Health Maintenance  Topic Date Due   Complete foot exam   Never done   Eye exam for diabetics  Never done   HIV Screening  Never done   Pneumococcal Vaccination (1 of 2 - PCV) Never done   Hepatitis B Vaccine (1 of 3 - 19+ 3-dose series) Never done   Zoster (Shingles) Vaccine (1 of 2) Never done   Pap with HPV screening  12/30/2020   Mammogram  07/11/2023   COVID-19 Vaccine (3 - Pfizer risk series) 04/17/2024*   Flu Shot  05/01/2024   Hemoglobin A1C  10/02/2024   Yearly kidney health urinalysis for diabetes  01/15/2025   Yearly kidney function blood test for diabetes  04/01/2025   DTaP/Tdap/Td vaccine (2 - Td or Tdap) 02/26/2028   Colon Cancer Screening  12/09/2031   Hepatitis C Screening  Completed   HPV Vaccine  Aged Out   Meningitis B Vaccine  Aged Out  *Topic was postponed. The date shown is not the original due date.    Wears seatbelt always, smoke detectors in home and functioning, does not text while driving, feels safe in home environment.  Depression screening:    05/24/2023    1:40 PM 01/22/2023    8:26 AM 11/07/2021    3:05 PM  Depression screen PHQ 2/9  Decreased Interest 0 0 0  Down, Depressed, Hopeless 0 0 0  PHQ - 2 Score 0 0 0   Anxiety Screening:     No data to display          Vision:Not within last year  and Dental: No current dental problems and Receives  regular dental care  Patient Active Problem List   Diagnosis Date Noted   Stage 3 chronic kidney disease (HCC) 04/01/2024   Hypokalemia 04/01/2024   Encounter for general adult medical examination with abnormal findings 04/01/2024   Pain in left knee 09/13/2023   Post-menopausal bleeding 01/22/2023   Seasonal allergies 01/22/2023   Fibroids 01/22/2023   Type 2 diabetes mellitus with morbid obesity (HCC) 01/22/2023   Daytime somnolence 09/19/2022   Postablative hypothyroidism 07/12/2022   Low back pain 03/13/2022   OSA (obstructive sleep apnea) 08/24/2020   Mild persistent asthma without complication 08/24/2020   Orthopnea 12/09/2019   At risk for sleep apnea 12/09/2019   Snoring 12/09/2019   Vitamin D  deficiency 05/11/2019   Pure hypercholesterolemia 05/11/2019   Uncontrolled hypertension 09/16/2018   Personal history of noncompliance with medical treatment, presenting hazards to health 09/16/2018   Multiple acquired skin tags 09/16/2018   Prediabetes 11/11/2017   Morbid obesity (HCC) 01/23/2017   Amenorrhea 01/23/2017   Right thyroid  nodule 01/23/2017   Essential hypertension 01/23/2017   Graves disease 12/31/2016   Asthma    Past Medical History:  Diagnosis Date   Anxiety  Asthma    Back pain    Diabetes mellitus without complication (HCC)    Dyspnea    Graves disease    HTN (hypertension)    Joint pain    Lower extremity edema    Metabolic syndrome    Morbid obesity (HCC)    Prediabetes    Sleep apnea    Thyroid  disease    Uncontrolled hypertension 09/16/2018   Past Surgical History:  Procedure Laterality Date   BREAST REDUCTION SURGERY  2008   CESAREAN SECTION     [place]     in [place]normal exam about 9 years ago-no records per pt   DILATION AND CURETTAGE OF UTERUS     INDUCED ABORTION     REDUCTION MAMMAPLASTY     uterine ablation     Social History   Tobacco Use   Smoking status: Never   Smokeless tobacco: Never  Vaping Use   Vaping  status: Never Used  Substance Use Topics   Alcohol use: Yes    Alcohol/week: 1.0 - 2.0 standard drink of alcohol    Types: 1 - 2 Glasses of wine per week    Comment: socially    Drug use: No      Patient Care Team: Lendia Boby CROME, NP-C as PCP - General (Family Medicine) Court Dorn PARAS, MD as PCP - Cardiology (Cardiology)   Outpatient Medications Prior to Visit  Medication Sig   albuterol  (PROVENTIL  HFA;VENTOLIN  HFA) 108 (90 Base) MCG/ACT inhaler Inhale 2 puffs into the lungs every 6 (six) hours as needed for wheezing or shortness of breath.   levothyroxine  (SYNTHROID ) 125 MCG tablet Take 1 tablet (125 mcg total) by mouth daily before breakfast.   meclizine  (ANTIVERT ) 25 MG tablet TAKE 1 TABLET(25 MG) BY MOUTH TWICE DAILY AS NEEDED FOR DIZZINESS (Patient taking differently: Take 25 mg by mouth 2 (two) times daily as needed for dizziness.)   ondansetron  (ZOFRAN -ODT) 4 MG disintegrating tablet 4mg  ODT q4 hours prn nausea/vomit   rosuvastatin  (CRESTOR ) 10 MG tablet TAKE 1 TABLET(10 MG) BY MOUTH DAILY   tirzepatide  (MOUNJARO ) 7.5 MG/0.5ML Pen Inject 7.5 mg into the skin once a week.   valsartan -hydrochlorothiazide  (DIOVAN -HCT) 320-25 MG tablet Take 1 tablet by mouth daily.   No facility-administered medications prior to visit.    Review of Systems  Constitutional:  Positive for weight loss. Negative for chills, fever and malaise/fatigue.       Intentional on GLP-1  HENT:  Negative for congestion, ear pain, sinus pain and sore throat.   Eyes:  Negative for blurred vision, double vision and pain.  Respiratory:  Negative for cough, shortness of breath and wheezing.   Cardiovascular:  Negative for chest pain, palpitations and leg swelling.  Gastrointestinal:  Positive for abdominal pain. Negative for blood in stool, constipation, diarrhea, nausea and vomiting.       Intermittent abdominal cramp  Genitourinary:  Negative for dysuria, frequency and urgency.  Musculoskeletal:  Negative  for back pain, joint pain and myalgias.  Skin:  Negative for rash.  Neurological:  Negative for dizziness, tingling, focal weakness, loss of consciousness and headaches.  Psychiatric/Behavioral:  Negative for depression and suicidal ideas. The patient is not nervous/anxious.        Objective:    BP 128/82   Pulse (!) 59   Temp 97.6 F (36.4 C) (Temporal)   Ht 5' 1 (1.549 m)   Wt 254 lb (115.2 kg)   SpO2 98%   BMI 47.99 kg/m  BP Readings  from Last 3 Encounters:  04/01/24 128/82  01/16/24 (!) 146/90  12/24/23 118/70   Wt Readings from Last 3 Encounters:  04/01/24 254 lb (115.2 kg)  01/16/24 262 lb (118.8 kg)  09/23/23 266 lb (120.7 kg)    Physical Exam Constitutional:      General: She is not in acute distress.    Appearance: She is not ill-appearing.  HENT:     Right Ear: Tympanic membrane, ear canal and external ear normal.     Left Ear: Tympanic membrane, ear canal and external ear normal.     Nose: Nose normal.     Mouth/Throat:     Mouth: Mucous membranes are moist.     Pharynx: Oropharynx is clear.  Eyes:     Extraocular Movements: Extraocular movements intact.     Conjunctiva/sclera: Conjunctivae normal.     Pupils: Pupils are equal, round, and reactive to light.  Neck:     Thyroid : No thyroid  mass, thyromegaly or thyroid  tenderness.  Cardiovascular:     Rate and Rhythm: Normal rate and regular rhythm.     Pulses: Normal pulses.     Heart sounds: Normal heart sounds.  Pulmonary:     Effort: Pulmonary effort is normal.     Breath sounds: Normal breath sounds.  Abdominal:     General: Bowel sounds are normal.     Palpations: Abdomen is soft.     Tenderness: There is no abdominal tenderness. There is no right CVA tenderness, left CVA tenderness, guarding or rebound.  Musculoskeletal:        General: Normal range of motion.     Cervical back: Normal range of motion and neck supple. No tenderness.     Right lower leg: No edema.     Left lower leg: No  edema.  Lymphadenopathy:     Cervical: No cervical adenopathy.  Skin:    General: Skin is warm and dry.     Findings: No lesion or rash.  Neurological:     General: No focal deficit present.     Mental Status: She is alert and oriented to person, place, and time.     Cranial Nerves: No cranial nerve deficit.     Sensory: No sensory deficit.     Motor: No weakness.     Gait: Gait normal.  Psychiatric:        Mood and Affect: Mood normal.        Behavior: Behavior normal.        Thought Content: Thought content normal.      Results for orders placed or performed in visit on 04/01/24  VITAMIN D  25 Hydroxy (Vit-D Deficiency, Fractures)  Result Value Ref Range   VITD 20.74 (L) 30.00 - 100.00 ng/mL  Vitamin B12  Result Value Ref Range   Vitamin B-12 321 211 - 911 pg/mL  TSH  Result Value Ref Range   TSH 64.13 (H) 0.35 - 5.50 uIU/mL  Hemoglobin A1c  Result Value Ref Range   Hgb A1c MFr Bld 5.7 4.6 - 6.5 %  Lipid panel  Result Value Ref Range   Cholesterol 144 0 - 200 mg/dL   Triglycerides 13.9 0.0 - 149.0 mg/dL   HDL 60.49 >60.99 mg/dL   VLDL 82.7 0.0 - 59.9 mg/dL   LDL Cholesterol 87 0 - 99 mg/dL   Total CHOL/HDL Ratio 4    NonHDL 104.19   Magnesium  Result Value Ref Range   Magnesium 2.2 1.5 - 2.5 mg/dL  Comprehensive metabolic  panel with GFR  Result Value Ref Range   Sodium 141 135 - 145 mEq/L   Potassium 3.4 (L) 3.5 - 5.1 mEq/L   Chloride 103 96 - 112 mEq/L   CO2 33 (H) 19 - 32 mEq/L   Glucose, Bld 108 (H) 70 - 99 mg/dL   BUN 12 6 - 23 mg/dL   Creatinine, Ser 8.50 (H) 0.40 - 1.20 mg/dL   Total Bilirubin 0.3 0.2 - 1.2 mg/dL   Alkaline Phosphatase 67 39 - 117 U/L   AST 19 0 - 37 U/L   ALT 16 0 - 35 U/L   Total Protein 7.1 6.0 - 8.3 g/dL   Albumin 4.2 3.5 - 5.2 g/dL   GFR 59.34 (L) >39.99 mL/min   Calcium  9.3 8.4 - 10.5 mg/dL  CBC with Differential/Platelet  Result Value Ref Range   WBC 7.7 4.0 - 10.5 K/uL   RBC 3.97 3.87 - 5.11 Mil/uL   Hemoglobin 11.7 (L)  12.0 - 15.0 g/dL   HCT 65.8 (L) 63.9 - 53.9 %   MCV 85.9 78.0 - 100.0 fl   MCHC 34.3 30.0 - 36.0 g/dL   RDW 85.1 88.4 - 84.4 %   Platelets 235.0 150.0 - 400.0 K/uL   Neutrophils Relative % 62.7 43.0 - 77.0 %   Lymphocytes Relative 29.9 12.0 - 46.0 %   Monocytes Relative 4.3 3.0 - 12.0 %   Eosinophils Relative 2.3 0.0 - 5.0 %   Basophils Relative 0.8 0.0 - 3.0 %   Neutro Abs 4.8 1.4 - 7.7 K/uL   Lymphs Abs 2.3 0.7 - 4.0 K/uL   Monocytes Absolute 0.3 0.1 - 1.0 K/uL   Eosinophils Absolute 0.2 0.0 - 0.7 K/uL   Basophils Absolute 0.1 0.0 - 0.1 K/uL      Assessment & Plan:    Routine Health Maintenance and Physical Exam  Problem List Items Addressed This Visit     Encounter for general adult medical examination with abnormal findings - Primary   Preventive health care reviewed.  Counseling on healthy lifestyle including diet and exercise.  Recommend regular dental and eye exams.  Immunizations reviewed.  Discussed safety. UTD with OB/GYN.  Declines pneumonia vaccine.        Essential hypertension   Improving. Now has CKD. Continue current medications and low sodium diet.  Weight loss will help. She is not treating her OSA. Continue to monitor at home. Follow up in 4-6 weeks.       Relevant Orders   CBC with Differential/Platelet (Completed)   Comprehensive metabolic panel with GFR (Completed)   Magnesium (Completed)   Hypokalemia   Due to hydrochlorothiazide  and possibly poor potassium intake. Replace with K-dur 10 MEQ daily. Recheck in 4-6 weeks       Mild persistent asthma without complication   Controlled. Albuterol  use infrequent.       OSA (obstructive sleep apnea)   Sleep test shows moderate OSA. She is not treating it.  Discussed potential health consequences associated with untreated sleep apnea. Encouraged her to call CPAP company regarding device.       Postablative hypothyroidism   TSH 64 today. Recommend good compliance taking medication on empty stomach in  the morning. Managed by Dr. Trixie. She has not scheduled a follow up yet. I have recommended she call and schedule ASAP.       Pure hypercholesterolemia   Continue statin. Low fat diet and weight loss. On Mounjaro .       Relevant Orders  Lipid panel (Completed)   Stage 3 chronic kidney disease (HCC)   Worsening renal function. Recommend good HTN control <130/80 and A1c <7.  She is on valsartan . Consider adding Jardiance or Farxiga. Consider referral to nephrology. Encouraged better hydration and follow up with me in 4-6 weeks for recheck.       Type 2 diabetes mellitus with morbid obesity (HCC)   Continue Mounjaro . 8 lb weight loss since last visit. Controlled. Recommend annual eye exam. Foot exam normal. Renal function worsening. Refer to nephrology or add SGLT-2.       Relevant Orders   CBC with Differential/Platelet (Completed)   Comprehensive metabolic panel with GFR (Completed)   Hemoglobin A1c (Completed)   TSH (Completed)   Vitamin B12 (Completed)   Vitamin D  deficiency   Vitamin D  is low. Ergocalciferol  50,000 IUs sent to pharmacy x 12 weeks.       Relevant Orders   VITAMIN D  25 Hydroxy (Vit-D Deficiency, Fractures) (Completed)   Other Visit Diagnoses       Muscle spasm       Relevant Orders   Magnesium (Completed)   Vitamin B12 (Completed)       Return in about 4 weeks (around 04/29/2024) for Fasting follow up.     Boby Mackintosh, NP-C

## 2024-04-01 NOTE — Assessment & Plan Note (Signed)
 Controlled. Albuterol  use infrequent.

## 2024-04-01 NOTE — Assessment & Plan Note (Signed)
 Worsening renal function. Recommend good HTN control <130/80 and A1c <7.  She is on valsartan . Consider adding Jardiance or Farxiga. Consider referral to nephrology. Encouraged better hydration and follow up with me in 4-6 weeks for recheck.

## 2024-04-01 NOTE — Assessment & Plan Note (Signed)
 Continue statin. Low fat diet and weight loss. On Mounjaro .

## 2024-04-01 NOTE — Patient Instructions (Signed)
 Please go downstairs for labs.   Call and schedule a diabetes eye exam.   You can get your Shingrix vaccines at the local pharmacy.   Try using CPAP   I will be in touch with your lab results.

## 2024-04-01 NOTE — Assessment & Plan Note (Signed)
 Preventive health care reviewed.  Counseling on healthy lifestyle including diet and exercise.  Recommend regular dental and eye exams.  Immunizations reviewed.  Discussed safety. UTD with OB/GYN.  Declines pneumonia vaccine.

## 2024-04-20 ENCOUNTER — Encounter: Payer: Self-pay | Admitting: Family Medicine

## 2024-04-20 ENCOUNTER — Ambulatory Visit (INDEPENDENT_AMBULATORY_CARE_PROVIDER_SITE_OTHER): Admitting: Family Medicine

## 2024-04-20 ENCOUNTER — Ambulatory Visit: Payer: Self-pay | Admitting: Family Medicine

## 2024-04-20 VITALS — BP 120/84 | HR 78 | Temp 98.2°F | Ht 61.0 in | Wt 253.5 lb

## 2024-04-20 DIAGNOSIS — M545 Low back pain, unspecified: Secondary | ICD-10-CM

## 2024-04-20 DIAGNOSIS — N183 Chronic kidney disease, stage 3 unspecified: Secondary | ICD-10-CM | POA: Diagnosis not present

## 2024-04-20 DIAGNOSIS — E876 Hypokalemia: Secondary | ICD-10-CM

## 2024-04-20 DIAGNOSIS — J4530 Mild persistent asthma, uncomplicated: Secondary | ICD-10-CM

## 2024-04-20 DIAGNOSIS — I10 Essential (primary) hypertension: Secondary | ICD-10-CM

## 2024-04-20 DIAGNOSIS — G4733 Obstructive sleep apnea (adult) (pediatric): Secondary | ICD-10-CM | POA: Diagnosis not present

## 2024-04-20 DIAGNOSIS — E559 Vitamin D deficiency, unspecified: Secondary | ICD-10-CM

## 2024-04-20 LAB — BASIC METABOLIC PANEL WITH GFR
BUN: 15 mg/dL (ref 6–23)
CO2: 30 meq/L (ref 19–32)
Calcium: 9.4 mg/dL (ref 8.4–10.5)
Chloride: 102 meq/L (ref 96–112)
Creatinine, Ser: 1.18 mg/dL (ref 0.40–1.20)
GFR: 53.75 mL/min — ABNORMAL LOW (ref >60.00)
Glucose, Bld: 98 mg/dL (ref 70–99)
Potassium: 3.3 meq/L — ABNORMAL LOW (ref 3.5–5.1)
Sodium: 139 meq/L (ref 135–145)

## 2024-04-20 NOTE — Progress Notes (Signed)
 Subjective:     Patient ID: Kim Tanner, female    DOB: 08-08-1971, 53 y.o.   MRN: [id number]  Chief Complaint  Patient presents with   Medical Management of Chronic Issues    3 weeks follow up and lab review     HPI  History of Present Illness         She is here for follow-up on abnormal labs including hypokalemia and abnormal renal function.  Reports taking prescribed K-Dur 10 milliequivalents every other day or so.    HTN- controlled at home.   DM - A1c 5.7%   CKD- takes ibuprofen 600 mg at night for low back pain    Plans to reach out to get new equipment for CPAP   [facility]- OSA.     Health Maintenance Due  Topic Date Due   FOOT EXAM  Never done   OPHTHALMOLOGY EXAM  Never done   HIV Screening  Never done   Pneumococcal Vaccine 23-58 Years old (1 of 2 - PCV) Never done   Hepatitis B Vaccines (1 of 3 - 19+ 3-dose series) Never done   Zoster Vaccines- Shingrix (1 of 2) Never done   MAMMOGRAM  07/11/2023    Past Medical History:  Diagnosis Date   Anxiety    Asthma    Back pain    Diabetes mellitus without complication (HCC)    Dyspnea    Graves disease    HTN (hypertension)    Joint pain    Lower extremity edema    Metabolic syndrome    Morbid obesity (HCC)    Prediabetes    Sleep apnea    Thyroid  disease    Uncontrolled hypertension 09/16/2018    Past Surgical History:  Procedure Laterality Date   BREAST REDUCTION SURGERY  2008   CESAREAN SECTION     [place]     [place]normal exam about 9 years ago-no records per pt   DILATION AND CURETTAGE OF UTERUS     INDUCED ABORTION     REDUCTION MAMMAPLASTY     uterine ablation      Family History  Problem Relation Age of Onset   Hypertension Mother    Obesity Mother    Hypertension Father    Diabetes Father    Diabetes Sister    Stroke Sister 71   Breast cancer Neg Hx    Colon cancer Neg Hx    Rectal cancer Neg Hx    Stomach cancer Neg Hx    Esophageal cancer Neg Hx     Colon polyps Neg Hx     Social History   Socioeconomic History   Marital status: Single    Spouse name: Not on file   Number of children: 2   Years of education: Not on file   Highest education level: Bachelor's degree (e.g., BA, AB, BS)  Occupational History   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    Employer: [employer]  Tobacco Use   Smoking status: Never   Smokeless tobacco: Never  Vaping Use   Vaping status: Never Used  Substance and Sexual Activity   Alcohol use: Yes    Alcohol/week: 1.0 - 2.0 standard drink of alcohol    Types: 1 - 2 Glasses of wine per week    Comment: socially    Drug use: No   Sexual activity: Not Currently    Birth control/protection: Condom  Other Topics Concern   Not on file  Social History Narrative  Not on file   Social Drivers of Health   Financial Resource Strain: Low Risk  (01/21/2023)   Overall Financial Resource Strain (CARDIA)    Difficulty of Paying Living Expenses: Not hard at all  Food Insecurity: No Food Insecurity (01/21/2023)   Hunger Vital Sign    Worried About Running Out of Food in the Last Year: Never true    Ran Out of Food in the Last Year: Never true  Transportation Needs: No Transportation Needs (01/21/2023)   PRAPARE - Administrator, Civil Service (Medical): No    Lack of Transportation (Non-Medical): No  Physical Activity: Insufficiently Active (01/21/2023)   Exercise Vital Sign    Days of Exercise per Week: 2 days    Minutes of Exercise per Session: 20 min  Stress: No Stress Concern Present (01/21/2023)   [employer] of Occupational Health - Occupational Stress Questionnaire    Feeling of Stress : Only a little  Social Connections: Moderately Isolated (01/21/2023)   Social Connection and Isolation Panel    Frequency of Communication with Friends and Family: More than three times a week    Frequency of Social Gatherings with Friends and Family: Once a week    Attends Religious  Services: More than 4 times per year    Active Member of [employer] or Organizations: No    Attends Engineer, structural: Not on file    Marital Status: Never married  Intimate Partner Violence: Not on file    Outpatient Medications Prior to Visit  Medication Sig Dispense Refill   albuterol  (PROVENTIL  HFA;VENTOLIN  HFA) 108 (90 Base) MCG/ACT inhaler Inhale 2 puffs into the lungs every 6 (six) hours as needed for wheezing or shortness of breath. 1 Inhaler 1   levothyroxine  (SYNTHROID ) 125 MCG tablet Take 1 tablet (125 mcg total) by mouth daily before breakfast. 30 tablet 1   meclizine  (ANTIVERT ) 25 MG tablet TAKE 1 TABLET(25 MG) BY MOUTH TWICE DAILY AS NEEDED FOR DIZZINESS (Patient taking differently: Take 25 mg by mouth 2 (two) times daily as needed for dizziness.) 30 tablet 1   ondansetron  (ZOFRAN -ODT) 4 MG disintegrating tablet 4mg  ODT q4 hours prn nausea/vomit 20 tablet 0   potassium chloride  (KLOR-CON  M) 10 MEQ tablet Take 1 tablet (10 mEq total) by mouth daily. 30 tablet 0   rosuvastatin  (CRESTOR ) 10 MG tablet TAKE 1 TABLET(10 MG) BY MOUTH DAILY 90 tablet 1   tirzepatide  (MOUNJARO ) 7.5 MG/0.5ML Pen Inject 7.5 mg into the skin once a week. 6 mL 1   valsartan -hydrochlorothiazide  (DIOVAN -HCT) 320-25 MG tablet Take 1 tablet by mouth daily. 90 tablet 3   Vitamin D , Ergocalciferol , (DRISDOL ) 1.25 MG (50000 UNIT) CAPS capsule Take 1 capsule (50,000 Units total) by mouth every 7 (seven) days. 6 capsule 1   No facility-administered medications prior to visit.    Allergies  Allergen Reactions   Compazine [Prochlorperazine Edisylate]     Jaw locked up    Review of Systems  Constitutional:  Negative for chills and fever.  Respiratory:  Negative for shortness of breath.   Cardiovascular:  Negative for chest pain, palpitations and leg swelling.  Gastrointestinal:  Negative for abdominal pain, constipation, diarrhea, nausea and vomiting.  Genitourinary:  Negative for dysuria, frequency and  urgency.  Neurological:  Negative for dizziness.       Objective:    Physical Exam Constitutional:      General: She is not in acute distress.    Appearance: She is not ill-appearing.  Eyes:  Extraocular Movements: Extraocular movements intact.     Conjunctiva/sclera: Conjunctivae normal.  Cardiovascular:     Rate and Rhythm: Normal rate.  Pulmonary:     Effort: Pulmonary effort is normal.  Musculoskeletal:     Cervical back: Normal range of motion and neck supple.  Skin:    General: Skin is warm and dry.  Neurological:     General: No focal deficit present.     Mental Status: She is alert and oriented to person, place, and time.     Motor: No weakness.     Coordination: Coordination normal.     Gait: Gait normal.  Psychiatric:        Mood and Affect: Mood normal.        Behavior: Behavior normal.        Thought Content: Thought content normal.      BP 120/84   Pulse 78   Temp 98.2 F (36.8 C) (Temporal)   Ht 5' 1 (1.549 m)   Wt 253 lb 8 oz (115 kg)   SpO2 96%   BMI 47.90 kg/m  Wt Readings from Last 3 Encounters:  04/20/24 253 lb 8 oz (115 kg)  04/01/24 254 lb (115.2 kg)  01/16/24 262 lb (118.8 kg)       Assessment & Plan:   Problem List Items Addressed This Visit     Essential hypertension   Hypokalemia   Relevant Orders   Basic metabolic panel with GFR   Low back pain   Mild persistent asthma without complication   Morbid obesity (HCC)   OSA (obstructive sleep apnea)   Stage 3 chronic kidney disease (HCC) - Primary   Relevant Orders   Basic metabolic panel with GFR   Vitamin D  deficiency   Recheck potassium level.  Reports having a difficult time swallowing the prescribed potassium pills.  Advised her that she can cut them in half.  Handout provided regarding potassium rich foods.  Replace potassium as needed. CKD-today she told me about having to take ibuprofen nightly for low back pain.  We discussed pain management that includes topical  pain medications, heat or ice and Tylenol  for pain.  She will stop ibuprofen.  Recommend staying hydrated.  Recheck renal function today. She plans to start using a CPAP machine and is going to reach out to [facility] today. Reports taking vitamin D  as prescribed. Asthma is controlled. Hypertension and diabetes controlled She will follow-up with me 4 weeks after using CPAP machine  I am having Cena Pouch maintain her albuterol , meclizine , rosuvastatin , ondansetron , tirzepatide , valsartan -hydrochlorothiazide , levothyroxine , potassium chloride , and Vitamin D  (Ergocalciferol ).  No orders of the defined types were placed in this encounter.

## 2024-04-20 NOTE — Patient Instructions (Signed)
 Please go downstairs for labs before you leave.  Try to eat a diet rich in potassium.  Stay hydrated  Stop ibuprofen and start topical pain medication over-the-counter as well as Tylenol  500 mg or 1000 mg up to 3 times daily as needed for back pain.  Let me know if you would like referral to sports medicine.  I will be in touch with your results.  My records indicate that you are overdue for Pap smear, mammogram, eye exam.  Please let me know when and where you get these done  Good luck with the CPAP.  I will see you back after you have been using it for 4 weeks.

## 2024-05-20 ENCOUNTER — Telehealth: Admitting: Physician Assistant

## 2024-05-20 DIAGNOSIS — U071 COVID-19: Secondary | ICD-10-CM | POA: Diagnosis not present

## 2024-05-20 MED ORDER — NIRMATRELVIR/RITONAVIR (PAXLOVID) TABLET (RENAL DOSING): 2.0000 | ORAL_TABLET | Freq: Two times a day (BID) | ORAL | 0 refills | Status: AC

## 2024-05-20 MED ORDER — PSEUDOEPH-BROMPHEN-DM 30-2-10 MG/5ML PO SYRP: 5.0000 mL | ORAL_SOLUTION | Freq: Four times a day (QID) | ORAL | 0 refills | Status: DC | PRN

## 2024-05-20 NOTE — Progress Notes (Signed)
 Virtual Visit Consent   [name], you are scheduled for a virtual visit with a [facility] provider today. Just as with appointments in the office, your consent must be obtained to participate. Your consent will be active for this visit and any virtual visit you may have with one of our providers in the next 365 days. If you have a MyChart account, a copy of this consent can be sent to you electronically.  As this is a virtual visit, video technology does not allow for your provider to perform a traditional examination. This may limit your provider's ability to fully assess your condition. If your provider identifies any concerns that need to be evaluated in person or the need to arrange testing (such as labs, EKG, etc.), we will make arrangements to do so. Although advances in technology are sophisticated, we cannot ensure that it will always work on either your end or our end. If the connection with a video visit is poor, the visit may have to be switched to a telephone visit. With either a video or telephone visit, we are not always able to ensure that we have a secure connection.  By engaging in this virtual visit, you consent to the provision of healthcare and authorize for your insurance to be billed (if applicable) for the services provided during this visit. Depending on your insurance coverage, you may receive a charge related to this service.  I need to obtain your verbal consent now. Are you willing to proceed with your visit today? Kim Tanner has provided verbal consent on 05/20/2024 for a virtual visit (video or telephone). Kim CHRISTELLA Dickinson, PA-C  Date: 05/20/2024 10:40 AM   Virtual Visit via Video Note   I, Kim Tanner, connected with  Kim Tanner  ([id number], 1971-01-31) on 05/20/24 at 10:30 AM EDT by a video-enabled telemedicine application and verified that I am speaking with the correct person using two identifiers.  Location: Patient: Virtual Visit  Location Patient: Home Provider: Virtual Visit Location Provider: Home Office   I discussed the limitations of evaluation and management by telemedicine and the availability of in person appointments. The patient expressed understanding and agreed to proceed.    History of Present Illness: Kim Tanner is a 53 y.o. who identifies as a female who was assigned female at birth, and is being seen today for Covid 49.  HPI: URI  This is a new problem. The current episode started in the past 7 days (Symptoms started Saturday, 05/16/24). The problem has been gradually worsening. There has been no fever. Associated symptoms include congestion, coughing, headaches, rhinorrhea, sinus pain, sneezing, a sore throat and wheezing. Pertinent negatives include no diarrhea, ear pain, nausea, plugged ear sensation or vomiting. Associated symptoms comments: Chills. She has tried inhaler use (sudafed) for the symptoms. The treatment provided no relief.     Problems:  Patient Active Problem List   Diagnosis Date Noted   Stage 3 chronic kidney disease (HCC) 04/01/2024   Hypokalemia 04/01/2024   Encounter for general adult medical examination with abnormal findings 04/01/2024   Pain in left knee 09/13/2023   Post-menopausal bleeding 01/22/2023   Seasonal allergies 01/22/2023   Fibroids 01/22/2023   Type 2 diabetes mellitus with morbid obesity (HCC) 01/22/2023   Daytime somnolence 09/19/2022   Postablative hypothyroidism 07/12/2022   Low back pain 03/13/2022   OSA (obstructive sleep apnea) 08/24/2020   Mild persistent asthma without complication 08/24/2020   Orthopnea 12/09/2019   At risk for sleep apnea 12/09/2019  Snoring 12/09/2019   Vitamin D  deficiency 05/11/2019   Pure hypercholesterolemia 05/11/2019   Uncontrolled hypertension 09/16/2018   Personal history of noncompliance with medical treatment, presenting hazards to health 09/16/2018   Multiple acquired skin tags 09/16/2018   Prediabetes  11/11/2017   Morbid obesity (HCC) 01/23/2017   Amenorrhea 01/23/2017   Right thyroid  nodule 01/23/2017   Essential hypertension 01/23/2017   Graves disease 12/31/2016   Asthma     Allergies:  Allergies  Allergen Reactions   Compazine [Prochlorperazine Edisylate]     Jaw locked up   Medications:  Current Outpatient Medications:    brompheniramine-pseudoephedrine-DM 30-2-10 MG/5ML syrup, Take 5 mLs by mouth 4 (four) times daily as needed., Disp: 120 mL, Rfl: 0   nirmatrelvir /ritonavir , renal dosing, (PAXLOVID ) 10 x 150 MG & 10 x 100MG  TABS, Take 2 tablets by mouth 2 (two) times daily for 5 days. (Take nirmatrelvir  150 mg one tablet twice daily for 5 days and ritonavir  100 mg one tablet twice daily for 5 days) Patient GFR is 53.7, Disp: 20 tablet, Rfl: 0   albuterol  (PROVENTIL  HFA;VENTOLIN  HFA) 108 (90 Base) MCG/ACT inhaler, Inhale 2 puffs into the lungs every 6 (six) hours as needed for wheezing or shortness of breath., Disp: 1 Inhaler, Rfl: 1   levothyroxine  (SYNTHROID ) 125 MCG tablet, Take 1 tablet (125 mcg total) by mouth daily before breakfast., Disp: 30 tablet, Rfl: 1   meclizine  (ANTIVERT ) 25 MG tablet, TAKE 1 TABLET(25 MG) BY MOUTH TWICE DAILY AS NEEDED FOR DIZZINESS (Patient taking differently: Take 25 mg by mouth 2 (two) times daily as needed for dizziness.), Disp: 30 tablet, Rfl: 1   ondansetron  (ZOFRAN -ODT) 4 MG disintegrating tablet, 4mg  ODT q4 hours prn nausea/vomit, Disp: 20 tablet, Rfl: 0   potassium chloride  (KLOR-CON  M) 10 MEQ tablet, Take 1 tablet (10 mEq total) by mouth daily., Disp: 30 tablet, Rfl: 0   rosuvastatin  (CRESTOR ) 10 MG tablet, TAKE 1 TABLET(10 MG) BY MOUTH DAILY, Disp: 90 tablet, Rfl: 1   tirzepatide  (MOUNJARO ) 7.5 MG/0.5ML Pen, Inject 7.5 mg into the skin once a week., Disp: 6 mL, Rfl: 1   valsartan -hydrochlorothiazide  (DIOVAN -HCT) 320-25 MG tablet, Take 1 tablet by mouth daily., Disp: 90 tablet, Rfl: 3   Vitamin D , Ergocalciferol , (DRISDOL ) 1.25 MG (50000  UNIT) CAPS capsule, Take 1 capsule (50,000 Units total) by mouth every 7 (seven) days., Disp: 6 capsule, Rfl: 1  Observations/Objective: Patient is well-developed, well-nourished in no acute distress.  Resting comfortably at home.  Head is normocephalic, atraumatic.  No labored breathing.  Speech is clear and coherent with logical content.  Patient is alert and oriented at baseline.    Assessment and Plan: 1. COVID-19 (Primary) - nirmatrelvir /ritonavir , renal dosing, (PAXLOVID ) 10 x 150 MG & 10 x 100MG  TABS; Take 2 tablets by mouth 2 (two) times daily for 5 days. (Take nirmatrelvir  150 mg one tablet twice daily for 5 days and ritonavir  100 mg one tablet twice daily for 5 days) Patient GFR is 53.7  Dispense: 20 tablet; Refill: 0 - brompheniramine-pseudoephedrine-DM 30-2-10 MG/5ML syrup; Take 5 mLs by mouth 4 (four) times daily as needed.  Dispense: 120 mL; Refill: 0  - Continue OTC symptomatic management of choice - Will send OTC vitamins and supplement information through AVS - Paxlovid -renal prescribed - Bromfed DM for cough - Patient enrolled in MyChart symptom monitoring - Push fluids - Rest as needed - Discussed return precautions and when to seek in-person evaluation, sent via AVS as well   Follow Up Instructions:  I discussed the assessment and treatment plan with the patient. The patient was provided an opportunity to ask questions and all were answered. The patient agreed with the plan and demonstrated an understanding of the instructions.  A copy of instructions were sent to the patient via MyChart unless otherwise noted below.    The patient was advised to call back or seek an in-person evaluation if the symptoms worsen or if the condition fails to improve as anticipated.    Kim CHRISTELLA Dickinson, PA-C

## 2024-05-20 NOTE — Patient Instructions (Signed)
 Kim Tanner, thank you for joining [name], PA-C for today's virtual visit.  While this provider is not your primary care provider (PCP), if your PCP is located in our provider database this encounter information will be shared with them immediately following your visit.   A [facility] MyChart account gives you access to today's visit and all your visits, tests, and labs performed at [facility]  click here if you don't have a  MyChart account or go to [email or web address]  Consent: (Patient) Kim Tanner provided verbal consent for this virtual visit at the beginning of the encounter.  Current Medications:  Current Outpatient Medications:    brompheniramine-pseudoephedrine-DM 30-2-10 MG/5ML syrup, Take 5 mLs by mouth 4 (four) times daily as needed., Disp: 120 mL, Rfl: 0   nirmatrelvir /ritonavir , renal dosing, (PAXLOVID ) 10 x 150 MG & 10 x 100MG  TABS, Take 2 tablets by mouth 2 (two) times daily for 5 days. (Take nirmatrelvir  150 mg one tablet twice daily for 5 days and ritonavir  100 mg one tablet twice daily for 5 days) Patient GFR is 53.7, Disp: 20 tablet, Rfl: 0   albuterol  (PROVENTIL  HFA;VENTOLIN  HFA) 108 (90 Base) MCG/ACT inhaler, Inhale 2 puffs into the lungs every 6 (six) hours as needed for wheezing or shortness of breath., Disp: 1 Inhaler, Rfl: 1   levothyroxine  (SYNTHROID ) 125 MCG tablet, Take 1 tablet (125 mcg total) by mouth daily before breakfast., Disp: 30 tablet, Rfl: 1   meclizine  (ANTIVERT ) 25 MG tablet, TAKE 1 TABLET(25 MG) BY MOUTH TWICE DAILY AS NEEDED FOR DIZZINESS (Patient taking differently: Take 25 mg by mouth 2 (two) times daily as needed for dizziness.), Disp: 30 tablet, Rfl: 1   ondansetron  (ZOFRAN -ODT) 4 MG disintegrating tablet, 4mg  ODT q4 hours prn nausea/vomit, Disp: 20 tablet, Rfl: 0   potassium chloride  (KLOR-CON  M) 10 MEQ tablet, Take 1 tablet (10 mEq total) by mouth daily., Disp: 30 tablet, Rfl: 0   rosuvastatin   (CRESTOR ) 10 MG tablet, TAKE 1 TABLET(10 MG) BY MOUTH DAILY, Disp: 90 tablet, Rfl: 1   tirzepatide  (MOUNJARO ) 7.5 MG/0.5ML Pen, Inject 7.5 mg into the skin once a week., Disp: 6 mL, Rfl: 1   valsartan -hydrochlorothiazide  (DIOVAN -HCT) 320-25 MG tablet, Take 1 tablet by mouth daily., Disp: 90 tablet, Rfl: 3   Vitamin D , Ergocalciferol , (DRISDOL ) 1.25 MG (50000 UNIT) CAPS capsule, Take 1 capsule (50,000 Units total) by mouth every 7 (seven) days., Disp: 6 capsule, Rfl: 1   Medications ordered in this encounter:  Meds ordered this encounter  Medications   nirmatrelvir /ritonavir , renal dosing, (PAXLOVID ) 10 x 150 MG & 10 x 100MG  TABS    Sig: Take 2 tablets by mouth 2 (two) times daily for 5 days. (Take nirmatrelvir  150 mg one tablet twice daily for 5 days and ritonavir  100 mg one tablet twice daily for 5 days) Patient GFR is 53.7    Dispense:  20 tablet    Refill:  0    Supervising Provider:   BLAISE ALEENE KIDD [[id number]]   brompheniramine-pseudoephedrine-DM 30-2-10 MG/5ML syrup    Sig: Take 5 mLs by mouth 4 (four) times daily as needed.    Dispense:  120 mL    Refill:  0    Supervising Provider:   BLAISE ALEENE KIDD [[id number]]     *If you need refills on other medications prior to your next appointment, please contact your pharmacy*  Follow-Up: Call back or seek an in-person evaluation if the symptoms worsen or if the condition fails to improve as  anticipated.  [facility] [phone]  Care Instructions: Can take to lessen severity: Vit C 500mg  twice daily Quercertin 250-500mg  twice daily Zinc 75-100mg  daily Melatonin 3-6 mg at bedtime Vit D3 1000-2000 IU daily Aspirin 81 mg daily with food Optional: Famotidine 20mg  daily Also can add tylenol /ibuprofen as needed for fevers and body aches May add Mucinex or Mucinex DM as needed for cough/congestion    Isolation Instructions: You are to isolate at home until you have been fever free for at least 24 hours without a  fever-reducing medication, and symptoms have been steadily improving for 24 hours. At that time,  you can end isolation but need to mask for an additional 5 days.   If you must be around other household members who do not have symptoms, you need to make sure that both you and the family members are masking consistently with a high-quality mask.  If you note any worsening of symptoms despite treatment, please seek an in-person evaluation ASAP. If you note any significant shortness of breath or any chest pain, please seek ER evaluation. Please do not delay care!   COVID-19: What to Do if You Are Sick If you test positive and are an older adult or someone who is at high risk of getting very sick from COVID-19, treatment may be available. Contact a healthcare provider right away after a positive test to determine if you are eligible, even if your symptoms are mild right now. You can also visit a Test to Treat location and, if eligible, receive a prescription from a provider. Don't delay: Treatment must be started within the first few days to be effective. If you have a fever, cough, or other symptoms, you might have COVID-19. Most people have mild illness and are able to recover at home. If you are sick: Keep track of your symptoms. If you have an emergency warning sign (including trouble breathing), call 911. Steps to help prevent the spread of COVID-19 if you are sick If you are sick with COVID-19 or think you might have COVID-19, follow the steps below to care for yourself and to help protect other people in your home and community. Stay home except to get medical care Stay home. Most people with COVID-19 have mild illness and can recover at home without medical care. Do not leave your home, except to get medical care. Do not visit public areas and do not go to places where you are unable to wear a mask. Take care of yourself. Get rest and stay hydrated. Take over-the-counter medicines, such as  acetaminophen , to help you feel better. Stay in touch with your doctor. Call before you get medical care. Be sure to get care if you have trouble breathing, or have any other emergency warning signs, or if you think it is an emergency. Avoid public transportation, ride-sharing, or taxis if possible. Get tested If you have symptoms of COVID-19, get tested. While waiting for test results, stay away from others, including staying apart from those living in your household. Get tested as soon as possible after your symptoms start. Treatments may be available for people with COVID-19 who are at risk for becoming very sick. Don't delay: Treatment must be started early to be effective--some treatments must begin within 5 days of your first symptoms. Contact your healthcare provider right away if your test result is positive to determine if you are eligible. Self-tests are one of several options for testing for the virus that causes COVID-19 and  may be more convenient than laboratory-based tests and point-of-care tests. Ask your healthcare provider or your local health department if you need help interpreting your test results. You can visit your state, tribal, local, and territorial health department's website to look for the latest local information on testing sites. Separate yourself from other people As much as possible, stay in a specific room and away from other people and pets in your home. If possible, you should use a separate bathroom. If you need to be around other people or animals in or outside of the home, wear a well-fitting mask. Tell your close contacts that they may have been exposed to COVID-19. An infected person can spread COVID-19 starting 48 hours (or 2 days) before the person has any symptoms or tests positive. By letting your close contacts know they may have been exposed to COVID-19, you are helping to protect everyone. See COVID-19 and Animals if you have questions about pets. If you  are diagnosed with COVID-19, someone from the health department may call you. Answer the call to slow the spread. Monitor your symptoms Symptoms of COVID-19 include fever, cough, or other symptoms. Follow care instructions from your healthcare provider and local health department. Your local health authorities may give instructions on checking your symptoms and reporting information. When to seek emergency medical attention Look for emergency warning signs* for COVID-19. If someone is showing any of these signs, seek emergency medical care immediately: Trouble breathing Persistent pain or pressure in the chest New confusion Inability to wake or stay awake Pale, gray, or blue-colored skin, lips, or nail beds, depending on skin tone *This list is not all possible symptoms. Please call your medical provider for any other symptoms that are severe or concerning to you. Call 911 or call ahead to your local emergency facility: Notify the operator that you are seeking care for someone who has or may have COVID-19. Call ahead before visiting your doctor Call ahead. Many medical visits for routine care are being postponed or done by phone or telemedicine. If you have a medical appointment that cannot be postponed, call your doctor's office, and tell them you have or may have COVID-19. This will help the office protect themselves and other patients. If you are sick, wear a well-fitting mask You should wear a mask if you must be around other people or animals, including pets (even at home). Wear a mask with the best fit, protection, and comfort for you. You don't need to wear the mask if you are alone. If you can't put on a mask (because of trouble breathing, for example), cover your coughs and sneezes in some other way. Try to stay at least 6 feet away from other people. This will help protect the people around you. Masks should not be placed on young children under age 67 years, anyone who has trouble  breathing, or anyone who is not able to remove the mask without help. Cover your coughs and sneezes Cover your mouth and nose with a tissue when you cough or sneeze. Throw away used tissues in a lined trash can. Immediately wash your hands with soap and water for at least 20 seconds. If soap and water are not available, clean your hands with an alcohol-based hand sanitizer that contains at least 60% alcohol. Clean your hands often Wash your hands often with soap and water for at least 20 seconds. This is especially important after blowing your nose, coughing, or sneezing; going to the bathroom; and before eating  or preparing food. Use hand sanitizer if soap and water are not available. Use an alcohol-based hand sanitizer with at least 60% alcohol, covering all surfaces of your hands and rubbing them together until they feel dry. Soap and water are the best option, especially if hands are visibly dirty. Avoid touching your eyes, nose, and mouth with unwashed hands. Handwashing Tips Avoid sharing personal household items Do not share dishes, drinking glasses, cups, eating utensils, towels, or bedding with other people in your home. Wash these items thoroughly after using them with soap and water or put in the dishwasher. Clean surfaces in your home regularly Clean and disinfect high-touch surfaces (for example, doorknobs, tables, handles, light switches, and countertops) in your sick room and bathroom. In shared spaces, you should clean and disinfect surfaces and items after each use by the person who is ill. If you are sick and cannot clean, a caregiver or other person should only clean and disinfect the area around you (such as your bedroom and bathroom) on an as needed basis. Your caregiver/other person should wait as long as possible (at least several hours) and wear a mask before entering, cleaning, and disinfecting shared spaces that you use. Clean and disinfect areas that may have blood,  stool, or body fluids on them. Use household cleaners and disinfectants. Clean visible dirty surfaces with household cleaners containing soap or detergent. Then, use a household disinfectant. Use a product from [employer] List N: Disinfectants for Coronavirus (COVID-19). Be sure to follow the instructions on the label to ensure safe and effective use of the product. Many products recommend keeping the surface wet with a disinfectant for a certain period of time (look at contact time on the product label). You may also need to wear personal protective equipment, such as gloves, depending on the directions on the product label. Immediately after disinfecting, wash your hands with soap and water for 20 seconds. For completed guidance on cleaning and disinfecting your home, visit Complete Disinfection Guidance. Take steps to improve ventilation at home Improve ventilation (air flow) at home to help prevent from spreading COVID-19 to other people in your household. Clear out COVID-19 virus particles in the air by opening windows, using air filters, and turning on fans in your home. Use this interactive tool to learn how to improve air flow in your home. When you can be around others after being sick with COVID-19 Deciding when you can be around others is different for different situations. Find out when you can safely end home isolation. For any additional questions about your care, contact your healthcare provider or state or local health department. 12/20/2020 Content source: [facility] for Immunization and Respiratory Diseases ([facility]), Division of Viral Diseases This information is not intended to replace advice given to you by your health care provider. Make sure you discuss any questions you have with your health care provider. Document Revised: 02/02/2021 Document Reviewed: 02/02/2021 Elsevier Patient Education  2022 [employer].     If you have been instructed to have an in-person  evaluation today at a local Urgent Care facility, please use the link below. It will take you to a list of all of our available [facility], including address, phone number and hours of operation. Please do not delay care.  [facility]  If you or a family member do not have a primary care provider, use the link below to schedule a visit and establish care. When you choose a [facility] primary  care physician or advanced practice provider, you gain a long-term partner in health. Find a Primary Care Provider  Learn more about [facility]'s in-office and virtual care options:  - Get Care Now

## 2024-05-22 ENCOUNTER — Ambulatory Visit: Admitting: Internal Medicine

## 2024-07-02 ENCOUNTER — Ambulatory Visit (INDEPENDENT_AMBULATORY_CARE_PROVIDER_SITE_OTHER): Admitting: Internal Medicine

## 2024-07-02 ENCOUNTER — Encounter: Payer: Self-pay | Admitting: Internal Medicine

## 2024-07-02 ENCOUNTER — Other Ambulatory Visit

## 2024-07-02 VITALS — BP 124/70 | HR 74 | Ht 61.0 in | Wt 239.0 lb

## 2024-07-02 DIAGNOSIS — Z8639 Personal history of other endocrine, nutritional and metabolic disease: Secondary | ICD-10-CM

## 2024-07-02 DIAGNOSIS — E890 Postprocedural hypothyroidism: Secondary | ICD-10-CM

## 2024-07-02 DIAGNOSIS — E041 Nontoxic single thyroid nodule: Secondary | ICD-10-CM | POA: Diagnosis not present

## 2024-07-02 LAB — T4, FREE: Free T4: 1.4 ng/dL (ref 0.8–1.8)

## 2024-07-02 LAB — TSH: TSH: 5.83 m[IU]/L — ABNORMAL HIGH

## 2024-07-02 NOTE — Patient Instructions (Addendum)
 Please continue levothyroxine  125 mcg daily.  Take the thyroid  hormone every day, with water, at least 30 minutes before breakfast, separated by at least 4 hours from: - acid reflux medications - calcium  - iron - multivitamins  Please stop at the lab.  Please return for another visit in 6 months.

## 2024-07-02 NOTE — Progress Notes (Signed)
 Patient ID: Kim Tanner, female   DOB: 25-Mar-1971, 53 y.o.   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  Last visit 1 year and 3 months ago.  Interim history: At today's visit, she feels well, had a significant weight loss of more than 30 pounds since last visit after improving diet and starting exercise.  She is also on Mounjaro  - per PCP.  Reviewed and addended history: Patient was diagnosed with thyrotoxicosis in 11/2016.  She was referred to endocrinology then, but could not come at that time, and only got to see me for the first time in 11/2018.  At last visit, her TFTs were still thyrotoxic and her TSI antibodies were elevated, confirming Graves' disease.  We started methimazole  5 mg twice a day, but she did not come back for labs 1.5 months later...  She had another set of TFTs by PCP in 05/2019 and these appeared worse.    She tells me that before her labs in 05/2019, she was missing 2nd dose of MMI at the day.  We contacted her to start taking the medication consistently.  At last visit she was taking 5 mg twice a day.  Since then, we checked a thyroid  uptake and scan (07/28/2019) and this was positive for Graves' disease: 4 hour I-123 uptake = 39%% (normal 5-20%) 24 hour I-123 uptake = 60%% (normal 10-30%)   IMPRESSION: Elevated 4 hour and 24 hour radio iodine uptakes consistent with hyperthyroidism. No focal scintigraphic abnormalities on imaging. Findings consistent with Graves disease.  She had RAI treatment (08/31/2019).  She felt well after RAI treatment, without complaints, however, she developed post ablation hypothyroidism in 12/2019.  We started levothyroxine  50 mcg daily and advised her to come back in 5 to 6 weeks for labs but she did not return. She saw PCP 7 months later and at that  time TSH was still extremely high, at 74.8.  We subsequently increased the levothyroxine .  She was on 125 mcg LT4 but the dose was then increased by PCP in 07/2022 to 150 mcg daily.  At that time, she mentioned that she was off levothyroxine  for almost 2 weeks!  At our visit from 03/28/2023, she mentioned she ran out of the 150 mcg daily and was on 125 mcg daily for the previous 2 weeks, previously off levothyroxine  completely for ~3 days. TSH was normal so we will continue the 125 mcg daily dose.  She was off LT4 for 2 weeks in 03/2024.  She takes this: - in am - fasting - still keeps it in the kitchen - coffee + cream > 30 min later - b'fast 2h later - no calcium  - no iron - + multivitamins at night - no PPIs - off B complex (30 mcg biotin)  Reviewed her TFTs: Lab Results  Component Value Date   TSH 64.13 (H) 04/01/2024   TSH 3.12 03/28/2023   TSH 50.200 (H) 07/12/2022   TSH 4.650 (H) 11/07/2021   TSH 22.27 (H) 04/13/2021   TSH 74.800 (H) 08/24/2020   TSH 90.54 Repeated and verified X2. (H) 01/21/2020   TSH <0.005 (L) 11/11/2019   TSH <0.01 (L) 07/14/2019   TSH <0.005 (L) 05/11/2019   FREET4 1.07 03/28/2023   FREET4 1.09 07/12/2022   FREET4 0.92 04/13/2021   FREET4 0.57 (L) 08/24/2020   FREET4 0.19 (L) 01/21/2020  FREET4 1.09 11/11/2019   FREET4 1.34 07/14/2019   FREET4 2.37 (H) 05/11/2019   FREET4 1.53 12/19/2018   FREET4 2.14 (H) 11/18/2018   T3FREE 2.0 (L) 01/21/2020   T3FREE 3.8 07/14/2019   T3FREE 5.3 (H) 12/19/2018   T3FREE 3.8 12/26/2016   Her TSI's were elevated confirming Graves' disease: Lab Results  Component Value Date   TSI 386 (H) 12/19/2018    Thyroid  U/S (01/21/2017): Moderately heterogeneous thyroid  of normal size, with a 1 cm nodule  Pt denies: - feeling nodules in neck - hoarseness - dysphagia - choking  No FH of thyroid  cancer. No h/o radiation tx to head or neck except RAI treatment.. No recent contrast studies. No herbal  supplements. No recent steroids use.   She also has a history of anxiety. She saw Dr. Kadolph in the weight management clinic. [facility]. She tried Phentermine >> felt poorly >> stopped. She has prediabetes and was started on Metformin  by Dr. Verdon 11/2018-but currently on Mounjaro   Reviewed HbA1c levels: Lab Results  Component Value Date   HGBA1C 5.7 04/01/2024   HGBA1C 6.0 01/16/2024   HGBA1C 6.0 09/13/2023   She has a history of HTN-on HCTZ   She also has hyperlipidemia and lipids improved after starting Crestor : Lab Results  Component Value Date   CHOL 144 04/01/2024   HDL 39.50 04/01/2024   LDLCALC 87 04/01/2024   TRIG 86.0 04/01/2024   CHOLHDL 4 04/01/2024   ROS: + See HPI  Past Medical History:  Diagnosis Date   Anxiety    Asthma    Back pain    Diabetes mellitus without complication (HCC)    Dyspnea    Graves disease    HTN (hypertension)    Joint pain    Lower extremity edema    Metabolic syndrome    Morbid obesity (HCC)    Prediabetes    Sleep apnea    Thyroid  disease    Uncontrolled hypertension 09/16/2018   Past Surgical History:  Procedure Laterality Date   BREAST REDUCTION SURGERY  2008   CESAREAN SECTION     COLONOSCOPY     in [place]-normal exam about 9 years ago-no records per pt   DILATION AND CURETTAGE OF UTERUS     INDUCED ABORTION     REDUCTION MAMMAPLASTY     uterine ablation     Social History   Socioeconomic History   Marital status: Single    Spouse name: Not on file   Number of children: 2   Years of education: Not on file   Highest education level: Bachelor's degree (e.g., BA, AB, BS)  Occupational History   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    Employer: [employer]  Tobacco Use   Smoking status: Never   Smokeless tobacco: Never  Vaping Use   Vaping status: Never Used  Substance and Sexual Activity   Alcohol use: Yes    Alcohol/week: 1.0 - 2.0 standard drink of alcohol    Types:  1 - 2 Glasses of wine per week    Comment: socially    Drug use: No   Sexual activity: Not Currently    Birth control/protection: Condom  Other Topics Concern   Not on file  Social History Narrative   Not on file   Social Drivers of Health   Financial Resource Strain: Low Risk  (01/21/2023)   Overall Financial Resource Strain (CARDIA)    Difficulty of Paying Living Expenses: Not hard at  all  Food Insecurity: No Food Insecurity (01/21/2023)   Hunger Vital Sign    Worried About Running Out of Food in the Last Year: Never true    Ran Out of Food in the Last Year: Never true  Transportation Needs: No Transportation Needs (01/21/2023)   PRAPARE - Administrator, Civil Service (Medical): No    Lack of Transportation (Non-Medical): No  Physical Activity: Insufficiently Active (01/21/2023)   Exercise Vital Sign    Days of Exercise per Week: 2 days    Minutes of Exercise per Session: 20 min  Stress: No Stress Concern Present (01/21/2023)   [employer] of Occupational Health - Occupational Stress Questionnaire    Feeling of Stress : Only a little  Social Connections: Moderately Isolated (01/21/2023)   Social Connection and Isolation Panel    Frequency of Communication with Friends and Family: More than three times a week    Frequency of Social Gatherings with Friends and Family: Once a week    Attends Religious Services: More than 4 times per year    Active Member of [employer] or Organizations: No    Attends Engineer, structural: Not on file    Marital Status: Never married  Intimate Partner Violence: Not on file   Current Outpatient Medications on File Prior to Visit  Medication Sig Dispense Refill   albuterol  (PROVENTIL  HFA;VENTOLIN  HFA) 108 (90 Base) MCG/ACT inhaler Inhale 2 puffs into the lungs every 6 (six) hours as needed for wheezing or shortness of breath. 1 Inhaler 1   brompheniramine-pseudoephedrine-DM 30-2-10 MG/5ML syrup Take 5 mLs by mouth 4 (four) times  daily as needed. 120 mL 0   levothyroxine  (SYNTHROID ) 125 MCG tablet Take 1 tablet (125 mcg total) by mouth daily before breakfast. 30 tablet 1   meclizine  (ANTIVERT ) 25 MG tablet TAKE 1 TABLET(25 MG) BY MOUTH TWICE DAILY AS NEEDED FOR DIZZINESS (Patient taking differently: Take 25 mg by mouth 2 (two) times daily as needed for dizziness.) 30 tablet 1   ondansetron  (ZOFRAN -ODT) 4 MG disintegrating tablet 4mg  ODT q4 hours prn nausea/vomit 20 tablet 0   potassium chloride  (KLOR-CON  M) 10 MEQ tablet Take 1 tablet (10 mEq total) by mouth daily. 30 tablet 0   rosuvastatin  (CRESTOR ) 10 MG tablet TAKE 1 TABLET(10 MG) BY MOUTH DAILY 90 tablet 1   tirzepatide  (MOUNJARO ) 7.5 MG/0.5ML Pen Inject 7.5 mg into the skin once a week. 6 mL 1   valsartan -hydrochlorothiazide  (DIOVAN -HCT) 320-25 MG tablet Take 1 tablet by mouth daily. 90 tablet 3   Vitamin D , Ergocalciferol , (DRISDOL ) 1.25 MG (50000 UNIT) CAPS capsule Take 1 capsule (50,000 Units total) by mouth every 7 (seven) days. 6 capsule 1   No current facility-administered medications on file prior to visit.   Allergies  Allergen Reactions   Compazine [Prochlorperazine Edisylate]     Jaw locked up   Family History  Problem Relation Age of Onset   Hypertension Mother    Obesity Mother    Hypertension Father    Diabetes Father    Diabetes Sister    Stroke Sister 13   Breast cancer Neg Hx    Colon cancer Neg Hx    Rectal cancer Neg Hx    Stomach cancer Neg Hx    Esophageal cancer Neg Hx    Colon polyps Neg Hx    PE: BP 124/70   Pulse 74   Ht 5' 1 (1.549 m)   Wt 239 lb (108.4 kg)   SpO2 98%  BMI 45.16 kg/m  Wt Readings from Last 20 Encounters:  07/02/24 239 lb (108.4 kg)  04/20/24 253 lb 8 oz (115 kg)  04/01/24 254 lb (115.2 kg)  01/16/24 262 lb (118.8 kg)  09/23/23 266 lb (120.7 kg)  09/13/23 266 lb (120.7 kg)  05/24/23 261 lb (118.4 kg)  03/28/23 273 lb (123.8 kg)  01/22/23 265 lb (120.2 kg)  12/25/22 250 lb (113.4 kg)  10/18/22  250 lb (113.4 kg)  09/21/22 273 lb 9.6 oz (124.1 kg)  09/19/22 273 lb (123.8 kg)  07/12/22 281 lb 6.4 oz (127.6 kg)  02/28/22 279 lb 6.4 oz (126.7 kg)  12/08/21 266 lb (120.7 kg)  11/27/21 266 lb (120.7 kg)  11/07/21 266 lb 9.6 oz (120.9 kg)  04/21/21 273 lb (123.8 kg)  01/19/21 264 lb 3.2 oz (119.8 kg)   Constitutional: overweight, in NAD Eyes: EOMI, no exophthalmos ENT: + very slight symmetric thyromegaly, no cervical lymphadenopathy Cardiovascular: RRR, No MRG Respiratory: CTA B Musculoskeletal: no deformities, strength intact in all 4 Skin: no rashes except acanthosis nigricans on neck skin tags on neck Neurological: no tremor with outstretched hands  ASSESSMENT: 1.  Postablative hypothyroidism - Now status post RAI treatment for Graves' disease  2.  Right thyroid  nodule  3.  Prediabetes  PLAN:  1. Patient with long history of untreated Graves' disease without thyrotoxic symptoms.  We tried methimazole  but she was not very compliant with medications and labs and her TFTs remained abnormal.  She finally had RAI treatment in 08/2019.  She developed postoperative hypothyroidism and we had to start levothyroxine , initially at 50 mcg daily, but we subsequently had to increase the dose to 150 mcg daily.  She was lost for follow-up afterwards and when she returned to see me in 07/2022, her TSH was elevated, at 50.  At that time, we discussed about the absolute importance of not missing levothyroxine  doses and possible consequences of uncontrolled hypothyroidism to include weight gain, fatigue, depression, hyperlipidemia, hypertension, bradycardia, heart block, pericarditis, dementia.  I again reiterated this possible complications at today's visit.  We also discussed that she may not be able to keep her driving license if her hypothyroidism remains uncontrolled due to the slow reflexes and risk of falling asleep at the wheel.  I strongly advised her to continue the levothyroxine  daily and  she agrees to do so from now on. -At today's visit we reviewed together her latest TSH obtained 3 months ago.  This was again very high -after running out of levothyroxine  for 2 weeks: Lab Results  Component Value Date   TSH 64.13 (H) 04/01/2024  - she restarted LT4 125 mcg daily after the above results.  Now takes it daily-but ran out yesterday - pt feels good on this dose. She lost 34 lbs since last OV -intentionally - we discussed about taking the thyroid  hormone every day, with water, >30 minutes before breakfast, separated by >4 hours from acid reflux medications, calcium , iron, multivitamins.  At last visit, she was keeping the tablet in the kitchen when she was missing doses.  I advised her to move it on the nightstand, on top of her phone to be able to remember it every morning. - will check thyroid  tests today: TSH and fT4 - If labs are abnormal, she will need to return for repeat TFTs in 1.5 months - Even if the tests are normal, I would like to see her back in 6 months with labs in 3 months  2.  Right thyroid  nodule - Measuring 1 cm by the thyroid  ultrasound from 2018 - He denies neck compression symptoms - The nodule does not appear to be worrisome.  We can continue to follow her clinically for this.  3.  Prediabetes - managed by PCP - Latest A1c was better: Lab Results  Component Value Date   HGBA1C 5.7 04/01/2024  -per PCP, on Mounjaro   Needs refills.  Orders Placed This Encounter  Procedures   TSH   T4, free   Lela Fendt, MD PhD [facility] End[facility]

## 2024-07-03 ENCOUNTER — Ambulatory Visit: Payer: Self-pay | Admitting: Internal Medicine

## 2024-07-03 DIAGNOSIS — E890 Postprocedural hypothyroidism: Secondary | ICD-10-CM

## 2024-07-03 MED ORDER — LEVOTHYROXINE SODIUM 125 MCG PO TABS: 125.0000 ug | ORAL_TABLET | Freq: Every day | ORAL | 3 refills | Status: DC

## 2024-07-03 NOTE — Addendum Note (Signed)
 Addended by: TRIXIE FILE on: 07/03/2024 05:20 PM   Modules accepted: Orders

## 2024-08-03 ENCOUNTER — Encounter: Payer: Self-pay | Admitting: Radiology

## 2024-08-05 ENCOUNTER — Other Ambulatory Visit: Payer: Self-pay | Admitting: Family

## 2024-08-05 ENCOUNTER — Telehealth: Payer: Self-pay

## 2024-08-05 MED ORDER — TIRZEPATIDE 10 MG/0.5ML ~~LOC~~ SOAJ
10.0000 mg | SUBCUTANEOUS | 1 refills | Status: AC
Start: 2024-08-05 — End: ?

## 2024-08-05 NOTE — Telephone Encounter (Signed)
**Note De-identified  [place] Obfuscation** Please advise 

## 2024-08-05 NOTE — Telephone Encounter (Signed)
 Called and informed pt increased dose sent in, pt verbalized understanding

## 2024-08-05 NOTE — Telephone Encounter (Signed)
 Copied from CRM [id number]. Topic: Clinical - Medication Question >> Aug 05, 2024 12:17 PM Sasha M wrote: Reason for CRM: Pt called in to request an increase for Mounjaro , she states she has hit a plateau and is concerned that her A1C and weight will return. Please call to advise at number on file

## 2024-08-20 ENCOUNTER — Other Ambulatory Visit

## 2024-08-21 LAB — T4, FREE: Free T4: 1.6 ng/dL (ref 0.8–1.8)

## 2024-08-21 LAB — TSH: TSH: 4.59 m[IU]/L — ABNORMAL HIGH

## 2024-08-21 MED ORDER — LEVOTHYROXINE SODIUM 137 MCG PO TABS: 137.0000 ug | ORAL_TABLET | Freq: Every day | ORAL | 5 refills | Status: AC

## 2024-09-15 ENCOUNTER — Ambulatory Visit: Admitting: Family Medicine

## 2024-09-16 ENCOUNTER — Encounter: Payer: Self-pay | Admitting: Family Medicine

## 2024-09-16 ENCOUNTER — Ambulatory Visit: Admitting: Family Medicine

## 2024-09-16 DIAGNOSIS — E785 Hyperlipidemia, unspecified: Secondary | ICD-10-CM

## 2024-09-16 DIAGNOSIS — E1169 Type 2 diabetes mellitus with other specified complication: Secondary | ICD-10-CM

## 2024-09-16 DIAGNOSIS — J4530 Mild persistent asthma, uncomplicated: Secondary | ICD-10-CM

## 2024-09-16 DIAGNOSIS — N183 Chronic kidney disease, stage 3 unspecified: Secondary | ICD-10-CM

## 2024-09-16 DIAGNOSIS — E559 Vitamin D deficiency, unspecified: Secondary | ICD-10-CM | POA: Diagnosis not present

## 2024-09-16 DIAGNOSIS — G4733 Obstructive sleep apnea (adult) (pediatric): Secondary | ICD-10-CM | POA: Diagnosis not present

## 2024-09-16 DIAGNOSIS — Z7985 Long-term (current) use of injectable non-insulin antidiabetic drugs: Secondary | ICD-10-CM

## 2024-09-16 DIAGNOSIS — I10 Essential (primary) hypertension: Secondary | ICD-10-CM

## 2024-09-16 NOTE — Progress Notes (Signed)
 Subjective:     Patient ID: Kim Tanner, female    DOB: Oct 27, 1970, 53 y.o.   MRN: [id number]  Chief Complaint  Patient presents with   Medical Management of Chronic Issues    F/u 10 mg mounjaro      HPI  Discussed the use of AI scribe software for clinical note transcription with the patient, who gave verbal consent to proceed.  History of Present Illness Kim Tanner is a 53 year old female with multiple chronic health conditions who presents for follow-up.  Glycemic control and weight management - Diabetes managed with Mounjaro  10 mg for 1 month, well tolerated - Engaged in healthier eating and increased physical activity using gym membership - Small amount of weight loss achieved  Hypertension and hyperlipidemia - Chronic hypertension and hyperlipidemia under ongoing management  Chronic kidney disease and electrolyte abnormalities - Stage 3 chronic kidney disease under monitoring - Ibuprofen discontinued to avoid nephrotoxicity - Recent laboratory findings of low potassium - Vitamin D  and multivitamin supplementation since age 75 for previously low-normal B12; unsure of vitamin D  dose  Obstructive sleep apnea and sleep disturbance - Difficulty tolerating CPAP therapy despite trying different masks - Very poor sleep quality persists - Interested in additional support for sleep improvement and CPAP adherence  Asthma - Mild persistent asthma, well controlled - One recent flare requiring inhaler use, likely triggered by influenza     Health Maintenance Due  Topic Date Due   FOOT EXAM  Never done   OPHTHALMOLOGY EXAM  Never done   HIV Screening  Never done   Pneumococcal Vaccine: 50+ Years (1 of 2 - PCV) Never done   Hepatitis B Vaccines 19-59 Average Risk (1 of 3 - 19+ 3-dose series) Never done   Zoster Vaccines- Shingrix (1 of 2) Never done   Mammogram  06/24/2021    Past Medical History:  Diagnosis Date   Anxiety    Asthma    Back pain     Diabetes mellitus without complication (HCC)    Dyspnea    Graves disease    HTN (hypertension)    Joint pain    Lower extremity edema    Metabolic syndrome    Morbid obesity (HCC)    Prediabetes    Sleep apnea    Thyroid  disease    Uncontrolled hypertension 09/16/2018    Past Surgical History:  Procedure Laterality Date   BREAST REDUCTION SURGERY  2008   CESAREAN SECTION     COLONOSCOPY     in [place]-normal exam about 9 years ago-no records per pt   DILATION AND CURETTAGE OF UTERUS     INDUCED ABORTION     REDUCTION MAMMAPLASTY     uterine ablation      Family History  Problem Relation Age of Onset   Hypertension Mother    Obesity Mother    Hypertension Father    Diabetes Father    Diabetes Sister    Stroke Sister 4   Breast cancer Neg Hx    Colon cancer Neg Hx    Rectal cancer Neg Hx    Stomach cancer Neg Hx    Esophageal cancer Neg Hx    Colon polyps Neg Hx     Social History   Socioeconomic History   Marital status: Single    Spouse name: Not on file   Number of children: 2   Years of education: Not on file   Highest education level: Associate degree: academic program  Occupational History  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    Employer: [employer]  Tobacco Use   Smoking status: Never   Smokeless tobacco: Never  Vaping Use   Vaping status: Never Used  Substance and Sexual Activity   Alcohol use: Yes    Alcohol/week: 1.0 - 2.0 standard drink of alcohol    Types: 1 - 2 Glasses of wine per week    Comment: socially    Drug use: No   Sexual activity: Not Currently    Birth control/protection: Condom  Other Topics Concern   Not on file  Social History Narrative   Not on file   Social Drivers of Health   Tobacco Use: Low Risk (09/16/2024)   Patient History    Smoking Tobacco Use: Never    Smokeless Tobacco Use: Never    Passive Exposure: Not on file  Financial Resource Strain: Low Risk (09/15/2024)   Overall Financial Resource  Strain (CARDIA)    Difficulty of Paying Living Expenses: Not hard at all  Food Insecurity: No Food Insecurity (09/15/2024)   Epic    Worried About Radiation Protection Practitioner of Food in the Last Year: Never true    Ran Out of Food in the Last Year: Never true  Transportation Needs: No Transportation Needs (09/15/2024)   Epic    Lack of Transportation (Medical): No    Lack of Transportation (Non-Medical): No  Physical Activity: Sufficiently Active (09/15/2024)   Exercise Vital Sign    Days of Exercise per Week: 5 days    Minutes of Exercise per Session: 30 min  Stress: Stress Concern Present (09/15/2024)   [employer] of Occupational Health - Occupational Stress Questionnaire    Feeling of Stress: Very much  Social Connections: Moderately Integrated (09/15/2024)   Social Connection and Isolation Panel    Frequency of Communication with Friends and Family: Three times a week    Frequency of Social Gatherings with Friends and Family: Once a week    Attends Religious Services: More than 4 times per year    Active Member of Clubs or Organizations: Yes    Attends Engineer, Structural: More than 4 times per year    Marital Status: Never married  Intimate Partner Violence: Not on file  Depression (PHQ2-9): Low Risk (09/16/2024)   Depression (PHQ2-9)    PHQ-2 Score: 0  Alcohol Screen: Low Risk (09/15/2024)   Alcohol Screen    Last Alcohol Screening Score (AUDIT): 1  Housing: Low Risk (09/15/2024)   Epic    Unable to Pay for Housing in the Last Year: No    Number of Times Moved in the Last Year: 0    Homeless in the Last Year: No  Utilities: Not on file  Health Literacy: Not on file    Outpatient Medications Prior to Visit  Medication Sig Dispense Refill   albuterol  (PROVENTIL  HFA;VENTOLIN  HFA) 108 (90 Base) MCG/ACT inhaler Inhale 2 puffs into the lungs every 6 (six) hours as needed for wheezing or shortness of breath. 1 Inhaler 1   levothyroxine  (SYNTHROID ) 137 MCG tablet Take 1  tablet (137 mcg total) by mouth daily before breakfast. 45 tablet 5   meclizine  (ANTIVERT ) 25 MG tablet TAKE 1 TABLET(25 MG) BY MOUTH TWICE DAILY AS NEEDED FOR DIZZINESS (Patient taking differently: Take 25 mg by mouth 2 (two) times daily as needed for dizziness.) 30 tablet 1   potassium chloride  (KLOR-CON  M) 10 MEQ tablet Take 1 tablet (10 mEq total) by mouth daily. 30 tablet 0  rosuvastatin  (CRESTOR ) 10 MG tablet TAKE 1 TABLET(10 MG) BY MOUTH DAILY 90 tablet 1   tirzepatide  (MOUNJARO ) 10 MG/0.5ML Pen Inject 10 mg into the skin once a week. 6 mL 1   valsartan -hydrochlorothiazide  (DIOVAN -HCT) 320-25 MG tablet Take 1 tablet by mouth daily. 90 tablet 3   Vitamin D , Ergocalciferol , (DRISDOL ) 1.25 MG (50000 UNIT) CAPS capsule Take 1 capsule (50,000 Units total) by mouth every 7 (seven) days. 6 capsule 1   No facility-administered medications prior to visit.    Allergies[1]  Review of Systems  Constitutional:  Negative for chills and fever.  Respiratory:  Negative for shortness of breath.   Cardiovascular:  Negative for chest pain, palpitations and leg swelling.  Gastrointestinal:  Negative for abdominal pain, constipation, diarrhea, nausea and vomiting.  Genitourinary:  Negative for dysuria, frequency and urgency.  Neurological:  Negative for dizziness.       Objective:    Physical Exam Constitutional:      General: She is not in acute distress.    Appearance: She is not ill-appearing.  Eyes:     Extraocular Movements: Extraocular movements intact.     Conjunctiva/sclera: Conjunctivae normal.  Cardiovascular:     Rate and Rhythm: Normal rate.  Pulmonary:     Effort: Pulmonary effort is normal.  Musculoskeletal:     Cervical back: Normal range of motion and neck supple.  Skin:    General: Skin is warm and dry.  Neurological:     General: No focal deficit present.     Mental Status: She is alert and oriented to person, place, and time.  Psychiatric:        Mood and Affect: Mood  normal.        Behavior: Behavior normal.        Thought Content: Thought content normal.      BP 126/84   Pulse 70   Temp 97.8 F (36.6 C) (Temporal)   Ht 5' 1 (1.549 m)   Wt 241 lb (109.3 kg)   SpO2 98%   BMI 45.54 kg/m  Wt Readings from Last 3 Encounters:  09/16/24 241 lb (109.3 kg)  07/02/24 239 lb (108.4 kg)  04/20/24 253 lb 8 oz (115 kg)       Assessment & Plan:   Problem List Items Addressed This Visit     Essential hypertension   Relevant Orders   CBC with Differential/Platelet   Comprehensive metabolic panel with GFR   Hyperlipidemia associated with type 2 diabetes mellitus (HCC) - Primary   Relevant Orders   Lipid panel   Mild persistent asthma without complication   Relevant Orders   Ambulatory referral to Pulmonology   OSA (obstructive sleep apnea)   Relevant Orders   Ambulatory referral to Pulmonology   Stage 3 chronic kidney disease (HCC)   Vitamin D  deficiency   Relevant Orders   VITAMIN D  25 Hydroxy (Vit-D Deficiency, Fractures)    Assessment and Plan Assessment & Plan Type 2 diabetes mellitus with morbid obesity Currently on Tirzepatide  (Mounjaro ) 10 MG/0.5ML subcutaneous once a week for weight management and glycemic control. Reports doing well on the current dose. - Continue Tirzepatide  (Mounjaro ) 10 MG/0.5ML subcutaneous once a week.  Stage 3 chronic kidney disease with hypokalemia Stage 3 chronic kidney disease with recent hypokalemia. Monitoring kidney function and potassium levels. Discontinued ibuprofen, which may have contributed to kidney function decline. If kidney function does not improve, referral to nephrology will be considered. - Continue to monitor kidney function and potassium levels. -  Encouraged hydration to support kidney function. - Will consider referral to nephrology if kidney function does not improve.  Essential hypertension Hypertension management ongoing with current medication regimen. - Continue current  antihypertensive regimen.  Pure hypercholesterolemia On Rosuvastatin  (Crestor ) 10 MG oral daily. Goal is to maintain cholesterol levels below 70. Not fasting today, so cholesterol levels will be checked at a later date. - Continue Rosuvastatin  (Crestor ) 10 MG oral daily. - Scheduled fasting lipid panel for tomorrow morning.  Obstructive sleep apnea Difficulty using CPAP due to discomfort with the mask and settings. Reports poor sleep quality. Referral to sleep specialist for further management and assistance with CPAP adjustment. - Referred to sleep specialist for CPAP management and adjustment.  Mild persistent asthma Asthma is well-controlled with occasional use of rescue inhaler. Recent wheezing episode likely due to flu exposure. - Continue current asthma management plan.  Vitamin D  deficiency Taking over-the-counter vitamin D  supplements. Previous prescription of ergocalciferol  (Drisdol ) 1.25 MG (50000 UNIT) oral every 7 days has been completed. - Adjust vitamin D  supplementation based on lab results.     I am having Kim Tanner maintain her albuterol , meclizine , rosuvastatin , valsartan -hydrochlorothiazide , potassium chloride , Vitamin D  (Ergocalciferol ), tirzepatide , and levothyroxine .  No orders of the defined types were placed in this encounter.      [1]  Allergies Allergen Reactions   Compazine [Prochlorperazine Edisylate]     Jaw locked up

## 2024-09-17 LAB — LIPID PANEL
Cholesterol: 139 mg/dL (ref 28–200)
HDL: 45.3 mg/dL (ref >39.00)
LDL Cholesterol: 84 mg/dL (ref 10–99)
NonHDL: 94.02
Total CHOL/HDL Ratio: 3
Triglycerides: 52 mg/dL (ref 10.0–149.0)
VLDL: 10.4 mg/dL (ref 0.0–40.0)

## 2024-09-17 LAB — CBC WITH DIFFERENTIAL/PLATELET
Basophils Absolute: 0 K/uL (ref 0.0–0.1)
Basophils Relative: 0.6 % (ref 0.0–3.0)
Eosinophils Absolute: 0.2 K/uL (ref 0.0–0.7)
Eosinophils Relative: 2.6 % (ref 0.0–5.0)
HCT: 36.2 % (ref 36.0–46.0)
Hemoglobin: 12.3 g/dL (ref 12.0–15.0)
Lymphocytes Relative: 25.3 % (ref 12.0–46.0)
Lymphs Abs: 1.5 K/uL (ref 0.7–4.0)
MCHC: 34 g/dL (ref 30.0–36.0)
MCV: 84 fl (ref 78.0–100.0)
Monocytes Absolute: 0.3 K/uL (ref 0.1–1.0)
Monocytes Relative: 5.1 % (ref 3.0–12.0)
Neutro Abs: 4 K/uL (ref 1.4–7.7)
Neutrophils Relative %: 66.4 % (ref 43.0–77.0)
Platelets: 217 K/uL (ref 150.0–400.0)
RBC: 4.3 Mil/uL (ref 3.87–5.11)
RDW: 15.3 % (ref 11.5–15.5)
WBC: 6 K/uL (ref 4.0–10.5)

## 2024-09-17 LAB — COMPREHENSIVE METABOLIC PANEL WITH GFR
ALT: 15 U/L (ref 3–35)
AST: 18 U/L (ref 5–37)
Albumin: 4.2 g/dL (ref 3.5–5.2)
Alkaline Phosphatase: 71 U/L (ref 39–117)
BUN: 11 mg/dL (ref 6–23)
CO2: 31 meq/L (ref 19–32)
Calcium: 9.4 mg/dL (ref 8.4–10.5)
Chloride: 102 meq/L (ref 96–112)
Creatinine, Ser: 1.04 mg/dL (ref 0.40–1.20)
GFR: 62.37 mL/min (ref >60.00)
Glucose, Bld: 88 mg/dL (ref 70–99)
Potassium: 3.6 meq/L (ref 3.5–5.1)
Sodium: 140 meq/L (ref 135–145)
Total Bilirubin: 0.5 mg/dL (ref 0.2–1.2)
Total Protein: 7.1 g/dL (ref 6.0–8.3)

## 2024-09-17 LAB — HEMOGLOBIN A1C: Hgb A1c MFr Bld: 5.6 % (ref 4.6–6.5)

## 2024-09-17 LAB — VITAMIN D 25 HYDROXY (VIT D DEFICIENCY, FRACTURES): VITD: 24.81 ng/mL — ABNORMAL LOW (ref 30.00–100.00)

## 2024-09-18 ENCOUNTER — Ambulatory Visit: Payer: Self-pay | Admitting: Family Medicine

## 2024-12-31 ENCOUNTER — Ambulatory Visit: Admitting: Internal Medicine
